# Patient Record
Sex: Female | Born: 1975 | Smoking: Current every day smoker
Health system: Southern US, Community
[De-identification: ages and names within clinical notes are randomized; demographics above are authoritative.]

## PROBLEM LIST (undated history)

## (undated) DIAGNOSIS — I10 Essential (primary) hypertension: Secondary | ICD-10-CM

## (undated) DIAGNOSIS — Q059 Spina bifida, unspecified: Secondary | ICD-10-CM

---

## 2012-11-09 ENCOUNTER — Emergency Department: Payer: Self-pay | Admitting: Emergency Medicine

## 2012-11-10 LAB — DRUG SCREEN, URINE
Amphetamines, Ur Screen: NEGATIVE (ref ?–1000)
Barbiturates, Ur Screen: NEGATIVE (ref ?–200)
Cocaine Metabolite,Ur ~~LOC~~: NEGATIVE (ref ?–300)
Opiate, Ur Screen: NEGATIVE (ref ?–300)
Phencyclidine (PCP) Ur S: NEGATIVE (ref ?–25)
Tricyclic, Ur Screen: NEGATIVE (ref ?–1000)

## 2012-11-10 LAB — COMPREHENSIVE METABOLIC PANEL
Anion Gap: 7 (ref 7–16)
Calcium, Total: 9.1 mg/dL (ref 8.5–10.1)
Chloride: 109 mmol/L — ABNORMAL HIGH (ref 98–107)
EGFR (Non-African Amer.): >60
Potassium: 3.8 mmol/L (ref 3.5–5.1)
SGOT(AST): 16 U/L (ref 15–37)
SGPT (ALT): 14 U/L (ref 12–78)
Sodium: 139 mmol/L (ref 136–145)
Total Protein: 7.6 g/dL (ref 6.4–8.2)

## 2012-11-10 LAB — ETHANOL
Ethanol %: <0.003 % (ref 0.000–0.080)
Ethanol: <3 mg/dL

## 2012-11-10 LAB — URINALYSIS, COMPLETE
Bacteria: NONE SEEN
Bilirubin,UR: NEGATIVE
Glucose,UR: NEGATIVE mg/dL (ref 0–75)
Hyaline Cast: 5
Ketone: NEGATIVE
Nitrite: NEGATIVE
RBC,UR: 4 /HPF (ref 0–5)
Specific Gravity: 1.024 (ref 1.003–1.030)

## 2012-11-10 LAB — CBC
HCT: 43.1 % (ref 35.0–47.0)
MCHC: 34.5 g/dL (ref 32.0–36.0)
MCV: 93 fL (ref 80–100)
Platelet: 269 10*3/uL (ref 150–440)
RBC: 4.66 10*6/uL (ref 3.80–5.20)

## 2012-11-10 LAB — TROPONIN I: Troponin-I: <0.02 ng/mL

## 2013-02-08 ENCOUNTER — Emergency Department: Payer: Self-pay

## 2013-02-08 LAB — COMPREHENSIVE METABOLIC PANEL
Alkaline Phosphatase: 95 U/L (ref 50–136)
Bilirubin,Total: 0.1 mg/dL — ABNORMAL LOW (ref 0.2–1.0)
Calcium, Total: 9.1 mg/dL (ref 8.5–10.1)
Chloride: 112 mmol/L — ABNORMAL HIGH (ref 98–107)
Co2: 20 mmol/L — ABNORMAL LOW (ref 21–32)
EGFR (African American): >60
EGFR (Non-African Amer.): >60
Glucose: 70 mg/dL (ref 65–99)
Osmolality: 272 (ref 275–301)
SGOT(AST): 19 U/L (ref 15–37)
Sodium: 138 mmol/L (ref 136–145)

## 2013-02-08 LAB — CBC
HCT: 46.1 % (ref 35.0–47.0)
MCH: 31.5 pg (ref 26.0–34.0)
MCV: 93 fL (ref 80–100)
RBC: 4.96 10*6/uL (ref 3.80–5.20)
RDW: 13.8 % (ref 11.5–14.5)

## 2013-02-08 LAB — TROPONIN I: Troponin-I: <0.02 ng/mL

## 2013-02-08 LAB — CK TOTAL AND CKMB (NOT AT ARMC)
CK, Total: 38 U/L (ref 21–215)
CK-MB: <0.5 ng/mL — ABNORMAL LOW (ref 0.5–3.6)

## 2013-02-11 ENCOUNTER — Emergency Department: Payer: Self-pay | Admitting: Emergency Medicine

## 2013-02-11 LAB — TROPONIN I
Troponin-I: <0.02 ng/mL
Troponin-I: <0.02 ng/mL

## 2013-02-11 LAB — COMPREHENSIVE METABOLIC PANEL
Albumin: 3.5 g/dL (ref 3.4–5.0)
Alkaline Phosphatase: 93 U/L (ref 50–136)
Anion Gap: 2 — ABNORMAL LOW (ref 7–16)
BUN: 14 mg/dL (ref 7–18)
Calcium, Total: 9.1 mg/dL (ref 8.5–10.1)
Co2: 26 mmol/L (ref 21–32)
Creatinine: 0.49 mg/dL — ABNORMAL LOW (ref 0.60–1.30)
EGFR (African American): >60
EGFR (Non-African Amer.): >60
Glucose: 90 mg/dL (ref 65–99)
Osmolality: 276 (ref 275–301)
Potassium: 5.1 mmol/L (ref 3.5–5.1)
SGOT(AST): 26 U/L (ref 15–37)
SGPT (ALT): 17 U/L (ref 12–78)

## 2013-02-11 LAB — CBC
HCT: 41.4 % (ref 35.0–47.0)
HGB: 14.1 g/dL (ref 12.0–16.0)
MCH: 31.7 pg (ref 26.0–34.0)
Platelet: 300 10*3/uL (ref 150–440)
WBC: 6.3 10*3/uL (ref 3.6–11.0)

## 2013-02-11 LAB — CK TOTAL AND CKMB (NOT AT ARMC)
CK, Total: 74 U/L (ref 21–215)
CK-MB: <0.5 ng/mL — ABNORMAL LOW (ref 0.5–3.6)

## 2017-10-21 ENCOUNTER — Encounter: Payer: Self-pay | Admitting: Emergency Medicine

## 2017-10-21 ENCOUNTER — Emergency Department
Admission: EM | Admit: 2017-10-21 | Discharge: 2017-10-21 | Disposition: A | Discharge status: Home / Self Care | Payer: Medicaid Other | Attending: Emergency Medicine | Admitting: Emergency Medicine

## 2017-10-21 ENCOUNTER — Emergency Department: Payer: Medicaid Other

## 2017-10-21 ENCOUNTER — Other Ambulatory Visit: Payer: Self-pay

## 2017-10-21 DIAGNOSIS — Y939 Activity, unspecified: Secondary | ICD-10-CM | POA: Insufficient documentation

## 2017-10-21 DIAGNOSIS — I10 Essential (primary) hypertension: Secondary | ICD-10-CM | POA: Insufficient documentation

## 2017-10-21 DIAGNOSIS — S93401A Sprain of unspecified ligament of right ankle, initial encounter: Secondary | ICD-10-CM | POA: Insufficient documentation

## 2017-10-21 DIAGNOSIS — Y929 Unspecified place or not applicable: Secondary | ICD-10-CM | POA: Diagnosis not present

## 2017-10-21 DIAGNOSIS — F17210 Nicotine dependence, cigarettes, uncomplicated: Secondary | ICD-10-CM | POA: Insufficient documentation

## 2017-10-21 DIAGNOSIS — Y999 Unspecified external cause status: Secondary | ICD-10-CM | POA: Diagnosis not present

## 2017-10-21 DIAGNOSIS — R609 Edema, unspecified: Secondary | ICD-10-CM

## 2017-10-21 DIAGNOSIS — X58XXXA Exposure to other specified factors, initial encounter: Secondary | ICD-10-CM | POA: Diagnosis not present

## 2017-10-21 DIAGNOSIS — S99911A Unspecified injury of right ankle, initial encounter: Secondary | ICD-10-CM | POA: Diagnosis present

## 2017-10-21 HISTORY — DX: Essential (primary) hypertension: I10

## 2017-10-21 HISTORY — DX: Spina bifida, unspecified: Q05.9

## 2017-10-21 MED ORDER — IBUPROFEN 600 MG PO TABS: 600.0000 mg | ORAL_TABLET | Freq: Three times a day (TID) | ORAL | 0 refills | Status: DC | PRN

## 2017-10-21 MED ORDER — IBUPROFEN 600 MG PO TABS
600.0000 mg | ORAL_TABLET | Freq: Once | ORAL | Status: AC
Start: 2017-10-21 — End: 2017-10-21
  Administered 2017-10-21: 600 mg via ORAL
  Filled 2017-10-21: qty 1

## 2017-10-21 NOTE — ED Provider Notes (Signed)
[facility] Emergency Department Provider Note ____________________________________________  Time seen: 1534  I have reviewed the triage vital signs and the nursing notes.  HISTORY  Chief Complaint  Foot Pain (unknown injury)  HPI Carolyn Herring is a 42 y.o. female presents to the ED for evaluation of right foot swelling. She reports onset upon awakening this morning. She denies any injury, slip, roll, contusion, or falling injury. She describes swelling to the dorsal and lateral aspect of the right foot. She denies distal paresthesias or skin changes. She denies a history of gout, DVT, or arthritis. She did not take any medicine for pain in the interim.   Past Medical History:  Diagnosis Date  . Hypertension   . Spina bifida (HCC)     There are no active problems to display for this patient.  History reviewed. No pertinent surgical history.  Prior to Admission medications   Medication Sig Start Date End Date Taking? Authorizing Provider  ibuprofen (ADVIL,MOTRIN) 600 MG tablet Take 1 tablet (600 mg total) by mouth every 8 (eight) hours as needed for moderate pain. 10/21/17   Rayce Brahmbhatt, Charlesetta IvoryJenise V Bacon, PA-C    Allergies Codeine; Hydrogen peroxide; and Latex  History reviewed. No pertinent family history.  Social History Social History   Tobacco Use  . Smoking status: Current Every Day Smoker    Packs/day: 0.50  . Smokeless tobacco: Never Used  Substance Use Topics  . Alcohol use: Never    Frequency: Never  . Drug use: Never    Review of Systems  Constitutional: Negative for fever. Cardiovascular: Negative for chest pain. Respiratory: Negative for shortness of breath. Musculoskeletal: Negative for back pain. Right foot pain as above Skin: Negative for rash. Neurological: Negative for headaches, focal weakness or numbness. ____________________________________________  PHYSICAL EXAM:  VITAL SIGNS: ED Triage Vitals  Enc Vitals Group   BP 10/21/17 1457 132/88     Pulse Rate 10/21/17 1457 78     Resp 10/21/17 1457 18     Temp 10/21/17 1457 98.4 F (36.9 C)     Temp Source 10/21/17 1457 Oral     SpO2 10/21/17 1457 100 %     Weight 10/21/17 1458 110 lb (49.9 kg)     Height 10/21/17 1458 4\' 8"  (1.422 m)     Head Circumference --      Peak Flow --      Pain Score 10/21/17 1457 7     Pain Loc --      Pain Edu? --      Excl. in GC? --     Constitutional: Alert and oriented. Well appearing and in no distress. Head: Normocephalic and atraumatic. Cardiovascular: Normal rate, regular rhythm. Normal distal pulses. Respiratory: Normal respiratory effort.  Musculoskeletal: Right foot with subtle soft tissue swelling to the lateral dorsal aspects.  Patient has similar Right foot with status post distal anterolateral dorsal aspect. Patient had similar lateral joint effusion swelling to the unaffected left ankle. Normal range of motion in all other extremities.  Neurologic:  Normal gait without ataxia. Normal speech and language. No gross focal neurologic deficits are appreciated. Skin:  Skin is warm, dry and intact. No rash noted. Psychiatric: Mood and affect are normal. Patient exhibits appropriate insight and judgment. ____________________________________________   RADIOLOGY  Right Foot  IMPRESSION: Soft tissue swelling without bony abnormality. ____________________________________________  PROCEDURES  Procedures IBU 600 mg PO Post-Op shoe ____________________________________________  INITIAL IMPRESSION / ASSESSMENT AND PLAN / ED COURSE  Patient with ED  evaluation of swelling and tenderness to the dorsal lateral right foot.  No injuries reported.  X-rays negative for any acute fracture but does indicate soft tissue swelling.  Symptoms do not seem to represent a acute gout flare.  Patient is otherwise stable on her exam.  She was placed in a postop shoe for comfort, and referred to podiatry for further evaluation and  management.  She will take over-the-counter ibuprofen as needed for pain relief. ____________________________________________  FINAL CLINICAL IMPRESSION(S) / ED DIAGNOSES  Final diagnoses:  Swelling  Sprain of right ankle, unspecified ligament, initial encounter      Lissa Hoard, PA-C 10/22/17 0015    Phineas Semen, MD 10/22/17 [id number]

## 2017-10-21 NOTE — ED Triage Notes (Signed)
Pt states does not remember injuring her rt foot but has swelling and pain today

## 2017-10-21 NOTE — Discharge Instructions (Addendum)
Your exam is consistent with a probable sprain. Take the ibuprofen as needed. Rest with the foot elevated. Wear the post-op shoe when walking. Follow-up with podiatry for ongoing symptoms and pain.

## 2017-10-21 NOTE — ED Notes (Signed)
Pt complains of right ankle/ foot pain, denies any recent trauma or injury. Pt able to plantar flex foot but states she is unable to tilt toes up or rotate foot side to side due to pain and tightness in the joint

## 2019-10-12 ENCOUNTER — Emergency Department: Payer: Medicaid Other

## 2019-10-12 ENCOUNTER — Encounter: Payer: Self-pay | Admitting: Emergency Medicine

## 2019-10-12 ENCOUNTER — Other Ambulatory Visit: Payer: Self-pay

## 2019-10-12 ENCOUNTER — Emergency Department
Admission: EM | Admit: 2019-10-12 | Discharge: 2019-10-12 | Disposition: A | Discharge status: Home / Self Care | Payer: Medicaid Other | Attending: Emergency Medicine | Admitting: Emergency Medicine

## 2019-10-12 DIAGNOSIS — F17210 Nicotine dependence, cigarettes, uncomplicated: Secondary | ICD-10-CM | POA: Insufficient documentation

## 2019-10-12 DIAGNOSIS — I10 Essential (primary) hypertension: Secondary | ICD-10-CM | POA: Diagnosis not present

## 2019-10-12 DIAGNOSIS — Z91040 Latex allergy status: Secondary | ICD-10-CM | POA: Diagnosis not present

## 2019-10-12 DIAGNOSIS — R220 Localized swelling, mass and lump, head: Secondary | ICD-10-CM | POA: Diagnosis not present

## 2019-10-12 LAB — BASIC METABOLIC PANEL
Anion gap: 8 (ref 5–15)
BUN: 5 mg/dL — ABNORMAL LOW (ref 6–20)
CO2: 22 mmol/L (ref 22–32)
Calcium: 8.6 mg/dL — ABNORMAL LOW (ref 8.9–10.3)
Chloride: 107 mmol/L (ref 98–111)
Creatinine, Ser: 0.74 mg/dL (ref 0.44–1.00)
GFR calc Af Amer: >60 mL/min (ref >60)
GFR calc non Af Amer: >60 mL/min (ref >60)
Glucose, Bld: 88 mg/dL (ref 70–99)
Potassium: 3.6 mmol/L (ref 3.5–5.1)
Sodium: 137 mmol/L (ref 135–145)

## 2019-10-12 LAB — CBC
HCT: 43.7 % (ref 36.0–46.0)
Hemoglobin: 14.5 g/dL (ref 12.0–15.0)
MCH: 30.9 pg (ref 26.0–34.0)
MCHC: 33.2 g/dL (ref 30.0–36.0)
MCV: 93 fL (ref 80.0–100.0)
Platelets: 328 10*3/uL (ref 150–400)
RBC: 4.7 MIL/uL (ref 3.87–5.11)
RDW: 14.6 % (ref 11.5–15.5)
WBC: 9.4 10*3/uL (ref 4.0–10.5)
nRBC: 0 % (ref 0.0–0.2)

## 2019-10-12 MED ORDER — DIPHENHYDRAMINE HCL 25 MG PO TABS: 25.0000 mg | ORAL_TABLET | Freq: Four times a day (QID) | ORAL | 0 refills | Status: DC | PRN

## 2019-10-12 NOTE — Discharge Instructions (Addendum)
Your lab tests and CT scan of the brain were okay today.

## 2019-10-12 NOTE — ED Triage Notes (Signed)
FIRST NURSE NOTE:  Pt reports right side facial numbness since last night.

## 2019-10-12 NOTE — ED Notes (Signed)
Pt presentation discussed with EDP, Stafford; see new orders. °

## 2019-10-12 NOTE — ED Notes (Addendum)
Pt states coming in as yesterday she started to have swelling to the right jaw. Pt states it feels like she was at the dentist and got a triple dose of the numbing medication. Pt states she has spinabifida and the sensation and movement of her extremities are normal, just numbness to the right side of the face. Pt states no known trauma.   Pt states she is now ready to go home.

## 2019-10-12 NOTE — ED Triage Notes (Signed)
Pt in via POV, reports new onset right side facial numnbess w/ tingling going down neck and into finger tips.  Some swelling noted to right jaw.  Onset of numbness approximately 2030 last night.  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    A/Ox4, otherwise neurologically intact, NAD noted at this time.

## 2019-10-12 NOTE — ED Provider Notes (Signed)
[facility] Emergency Department Provider Note  ____________________________________________  Time seen: Approximately 8:45 PM  I have reviewed the triage vital signs and the nursing notes.   HISTORY  Chief Complaint Facial Swelling and Numbness    HPI Carolyn Herring is a 44 y.o. female with a history of hypertension and spina bifida who comes the ED complaining of right facial swelling that started yesterday evening.  Waxing and waning, no aggravating relieving factors.  No known trauma or bites, no new exposures, denies tongue or throat swelling or dysphagia.  No lip swelling.  Never had anything like this before.  No recent viral illness.  No aggravating or alleviating factors.  No vision changes or lateralizing paresthesia or weakness.  No change in balance or coordination, no falls.      Past Medical History:  Diagnosis Date  . Hypertension   . Spina bifida (HCC)      There are no problems to display for this patient.    History reviewed. No pertinent surgical history.   Prior to Admission medications   Medication Sig Start Date End Date Taking? Authorizing Provider  diphenhydrAMINE (BENADRYL) 25 MG tablet Take 1 tablet (25 mg total) by mouth every 6 (six) hours as needed. 10/12/19   Sharman Cheek, MD  ibuprofen (ADVIL,MOTRIN) 600 MG tablet Take 1 tablet (600 mg total) by mouth every 8 (eight) hours as needed for moderate pain. 10/21/17   Menshew, Charlesetta Ivory, PA-C     Allergies Codeine, Hydrogen peroxide, and Latex   No family history on file.  Social History Social History   Tobacco Use  . Smoking status: Current Every Day Smoker    Packs/day: 0.50  . Smokeless tobacco: Never Used  Vaping Use  . Vaping Use: Never used  Substance Use Topics  . Alcohol use: Never  . Drug use: Never    Review of Systems  Constitutional:   No fever or chills.  ENT:   No sore throat. No rhinorrhea. Cardiovascular:   No chest pain or  syncope. Respiratory:   No dyspnea or cough. Gastrointestinal:   Negative for abdominal pain, vomiting and diarrhea.  Musculoskeletal:   Negative for focal pain or swelling All other systems reviewed and are negative except as documented above in ROS and HPI.  ____________________________________________   PHYSICAL EXAM:  VITAL SIGNS: ED Triage Vitals  Enc Vitals Group     BP 10/12/19 1620 (!) 159/110     Pulse Rate 10/12/19 1620 92     Resp 10/12/19 1918 18     Temp 10/12/19 1620 98.6 F (37 C)     Temp Source 10/12/19 1620 Oral     SpO2 10/12/19 1620 99 %     Weight 10/12/19 1609 120 lb (54.4 kg)     Height 10/12/19 1609 4\' 8"  (1.422 m)     Head Circumference --      Peak Flow --      Pain Score 10/12/19 1609 6     Pain Loc --      Pain Edu? --      Excl. in GC? --     Vital signs reviewed, nursing assessments reviewed.   Constitutional:   Alert and oriented. Non-toxic appearance. Eyes:   Conjunctivae are normal. EOMI. PERRL. ENT      Head:   Normocephalic and atraumatic.  There is mild soft edema over the right maxilla without bony point tenderness or ecchymosis.  No battle sign or raccoon eyes  Nose:   Normal, no rhinorrhea      Mouth/Throat: Moist mucous membranes, no tongue elevation or swelling, no palatal swelling.      Neck:   No meningismus. Full ROM. Hematological/Lymphatic/Immunilogical:   No cervical lymphadenopathy. Cardiovascular:   RRR. Symmetric bilateral radial and DP pulses.  No murmurs. Cap refill less than 2 seconds. Respiratory:   Normal respiratory effort without tachypnea/retractions. Breath sounds are clear and equal bilaterally. No wheezes/rales/rhonchi. Gastrointestinal:   Soft and nontender. Non distended. There is no CVA tenderness.  No rebound, rigidity, or guarding.  Musculoskeletal:   Normal range of motion in all extremities. No joint effusions.  No lower extremity tenderness.  No edema. Neurologic:   Normal speech and language.   Motor grossly intact. No acute focal neurologic deficits are appreciated.  Skin:    Skin is warm, dry and intact. No rash noted.  No petechiae, purpura, or bullae.  ____________________________________________    LABS (pertinent positives/negatives) (all labs ordered are listed, but only abnormal results are displayed) Labs Reviewed  BASIC METABOLIC PANEL - Abnormal; Notable for the following components:      Result Value   BUN 5 (*)    Calcium 8.6 (*)    All other components within normal limits  CBC   ____________________________________________   EKG    ____________________________________________    RADIOLOGY  CT Head Wo Contrast  Result Date: 10/12/2019 CLINICAL DATA:  Right-sided facial numbness. EXAM: CT HEAD WITHOUT CONTRAST TECHNIQUE: Contiguous axial images were obtained from the base of the skull through the vertex without intravenous contrast. COMPARISON:  February 08, 2013. FINDINGS: Brain: Distal tip of right frontal ventriculostomy catheter is seen in the midline between the frontal horns. No mass effect or midline shift is noted. Ventricular size is within normal limits. There is no evidence of mass lesion, hemorrhage or acute infarction. Vascular: No hyperdense vessel or unexpected calcification. Skull: Right frontal ventriculostomy is noted. 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 No acute abnormality is noted involving the skull. Sinuses/Orbits: No acute finding. Other: None. IMPRESSION: Stable right frontal ventriculostomy. Ventricular size is within normal limits. No acute intracranial abnormality seen. Electronically Signed   By: Lupita Raider M.D.   On: 10/12/2019 16:57    ____________________________________________   PROCEDURES Procedures  ____________________________________________    CLINICAL IMPRESSION / ASSESSMENT AND PLAN / ED COURSE  Medications ordered in the ED: Medications - No data to  display  Pertinent labs & imaging results that were available during my care of the patient were reviewed by me and considered in my medical decision making (see chart for details).  YEIRA GULDEN was evaluated in Emergency Department on 10/12/2019 for the symptoms described in the history of present illness. She was evaluated in the context of the global COVID-19 pandemic, which necessitated consideration that the patient might be at risk for infection with the SARS-CoV-2 virus that causes COVID-19. Institutional protocols and algorithms that pertain to the evaluation of patients at risk for COVID-19 are in a state of rapid change based on information released by regulatory bodies including the CDC and federal and state organizations. These policies and algorithms were followed during the patient's care in the ED.   Patient presents with mild swelling over the right maxilla as an isolated symptom.  No other concerning exam findings.  Vital signs are normal, breathing is unlabored, rest of the exam is reassuring.  No evidence of infection, doubt angioedema or parotitis or  hypersensitivity reaction.  I have the patient try Benadryl, monitor symptoms over the next 48 hours, return to ED if worsening or see her doctor if not resolved in 2 days      ____________________________________________   FINAL CLINICAL IMPRESSION(S) / ED DIAGNOSES    Final diagnoses:  Facial swelling     ED Discharge Orders         Ordered    diphenhydrAMINE (BENADRYL) 25 MG tablet  Every 6 hours PRN     Discontinue  Reprint     10/12/19 2044          Portions of this note were generated with dragon dictation software. Dictation errors may occur despite best attempts at proofreading.   Sharman Cheek, MD 10/12/19 2048

## 2019-12-30 ENCOUNTER — Emergency Department: Payer: Medicaid Other

## 2019-12-30 ENCOUNTER — Encounter: Payer: Self-pay | Admitting: Emergency Medicine

## 2019-12-30 ENCOUNTER — Other Ambulatory Visit: Payer: Self-pay

## 2019-12-30 ENCOUNTER — Emergency Department
Admission: EM | Admit: 2019-12-30 | Discharge: 2019-12-30 | Disposition: A | Discharge status: Home / Self Care | Payer: Medicaid Other | Attending: Emergency Medicine | Admitting: Emergency Medicine

## 2019-12-30 DIAGNOSIS — M7989 Other specified soft tissue disorders: Secondary | ICD-10-CM

## 2019-12-30 DIAGNOSIS — I110 Hypertensive heart disease with heart failure: Secondary | ICD-10-CM | POA: Insufficient documentation

## 2019-12-30 DIAGNOSIS — F17200 Nicotine dependence, unspecified, uncomplicated: Secondary | ICD-10-CM | POA: Insufficient documentation

## 2019-12-30 DIAGNOSIS — R2242 Localized swelling, mass and lump, left lower limb: Secondary | ICD-10-CM | POA: Diagnosis not present

## 2019-12-30 DIAGNOSIS — I509 Heart failure, unspecified: Secondary | ICD-10-CM | POA: Insufficient documentation

## 2019-12-30 NOTE — ED Triage Notes (Signed)
Pt to triage via w/c with no distress noted; pt st left LE swelling & pain for "weeks" with no known injury; denies any other c/o or injuries

## 2019-12-30 NOTE — ED Notes (Signed)
Pt alert and oriented X 4, stable for discharge. RR even and unlabored, color WNL. Discussed discharge instructions and follow-up as directed. Discharge medications discussed if provided. Pt had opportunity to ask questions if necessary and RN to provide patient/family eduction.  

## 2019-12-30 NOTE — ED Provider Notes (Signed)
[facility] Emergency Department Provider Note   ____________________________________________    I have reviewed the triage vital signs and the nursing notes.   HISTORY  Chief Complaint Leg Pain     HPI Carolyn Herring is a 44 y.o. Carolyn with a history as noted below who presents with complaints of left leg swelling.  Patient reports she had significant swelling of her left leg today, it has been swelling mildly over the last several weeks but seem much worse today.  She denies significant pain.  She is concerned because her aunt has a history of CHF.  No swelling in her right leg.  No shortness of breath.  No pleurisy.  No history of blood clots.  Past Medical History:  Diagnosis Date  . Hypertension   . Spina bifida (HCC)     There are no problems to display for this patient.   History reviewed. No pertinent surgical history.  Prior to Admission medications   Medication Sig Start Date End Date Taking? Authorizing Provider  diphenhydrAMINE (BENADRYL) 25 MG tablet Take 1 tablet (25 mg total) by mouth every 6 (six) hours as needed. 10/12/19   Sharman Cheek, MD  ibuprofen (ADVIL,MOTRIN) 600 MG tablet Take 1 tablet (600 mg total) by mouth every 8 (eight) hours as needed for moderate pain. 10/21/17   Menshew, Charlesetta Ivory, PA-C     Allergies Codeine, Hydrogen peroxide, and Latex  No family history on file.  Social History Social History   Tobacco Use  . Smoking status: Current Every Day Smoker    Packs/day: 0.50  . Smokeless tobacco: Never Used  Vaping Use  . Vaping Use: Never used  Substance Use Topics  . Alcohol use: Never  . Drug use: Never    Review of Systems  Constitutional: No fever/chills Eyes: No visual changes.  ENT: No sore throat. Cardiovascular: Denies chest pain. Respiratory: As above Gastrointestinal: No abdominal pain.  No nausea, no vomiting.   Genitourinary: Negative for dysuria. Musculoskeletal: As  above Skin: Negative for rash. Neurological: Negative for headaches or weakness   ____________________________________________   PHYSICAL EXAM:  VITAL SIGNS: ED Triage Vitals  Enc Vitals Group     BP 12/30/19 1928 (!) 188/100     Pulse Rate 12/30/19 1928 77     Resp 12/30/19 1928 18     Temp 12/30/19 1928 98.4 F (36.9 C)     Temp Source 12/30/19 1928 Oral     SpO2 12/30/19 1928 100 %     Weight 12/30/19 1928 53.5 kg (118 lb)     Height 12/30/19 1928 1.422 m (4\' 8" )     Head Circumference --      Peak Flow --      Pain Score 12/30/19 1929 8     Pain Loc --      Pain Edu? --      Excl. in GC? --     Constitutional: Alert and oriented.  Nose: No congestion/rhinnorhea. Mouth/Throat: Mucous membranes are moist.   Neck:  Painless ROM Cardiovascular: Normal rate, regular rhythm. Grossly normal heart sounds.  Good peripheral circulation. Respiratory: Normal respiratory effort.  No retractions. Lungs CTAB.   Musculoskeletal: Left lower extremity: Very minimal swelling, no pitting edema, no tenderness palpation, no erythema, warm and well perfused, normal DP pulse Neurologic:  Normal speech and language. No gross focal neurologic deficits are appreciated.  Skin:  Skin is warm, dry and intact. No rash noted. Psychiatric: Mood and affect  are normal. Speech and behavior are normal.  ____________________________________________   LABS (all labs ordered are listed, but only abnormal results are displayed)  Labs Reviewed - No data to display ____________________________________________  EKG  None ____________________________________________  RADIOLOGY  Ultrasound negative for DVT ____________________________________________   PROCEDURES  Procedure(s) performed: No  Procedures   Critical Care performed: No ____________________________________________   INITIAL IMPRESSION / ASSESSMENT AND PLAN / ED COURSE  Pertinent labs & imaging results that were available  during my care of the patient were reviewed by me and considered in my medical decision making (see chart for details).  Patient with left leg swelling as detailed above, differential includes DVT, lympha, nonspecific peripheral edema  Ultrasound negative for DVT which is quite reassuring, unlikely to be asymmetric edema related to CHF although she does have a history of high blood pressure, have asked her to follow-up with PCP for further evaluation of lower extremity edema.    ____________________________________________   FINAL CLINICAL IMPRESSION(S) / ED DIAGNOSES  Final diagnoses:  Leg swelling        Note:  This document was prepared using Dragon voice recognition software and may include unintentional dictation errors.   Jene Every, MD 12/30/19 2118

## 2019-12-30 NOTE — ED Triage Notes (Signed)
First Nurse Note:  C/O left leg pain x 1 day.  VS wnl.  BP:  179/116.

## 2021-01-04 ENCOUNTER — Emergency Department: Payer: Medicaid Other

## 2021-01-04 ENCOUNTER — Inpatient Hospital Stay: Payer: Medicaid Other

## 2021-01-04 ENCOUNTER — Inpatient Hospital Stay
Admission: EM | Admit: 2021-01-04 | Discharge: 2021-01-18 | DRG: 871 | Disposition: A | Discharge status: Skilled Nursing Facility | Payer: Medicaid Other | Attending: Internal Medicine | Admitting: Internal Medicine

## 2021-01-04 ENCOUNTER — Other Ambulatory Visit: Payer: Self-pay

## 2021-01-04 ENCOUNTER — Encounter: Payer: Self-pay | Admitting: Emergency Medicine

## 2021-01-04 DIAGNOSIS — L8990 Pressure ulcer of unspecified site, unspecified stage: Secondary | ICD-10-CM | POA: Insufficient documentation

## 2021-01-04 DIAGNOSIS — J9602 Acute respiratory failure with hypercapnia: Secondary | ICD-10-CM | POA: Diagnosis present

## 2021-01-04 DIAGNOSIS — G9349 Other encephalopathy: Secondary | ICD-10-CM | POA: Diagnosis present

## 2021-01-04 DIAGNOSIS — Z91040 Latex allergy status: Secondary | ICD-10-CM

## 2021-01-04 DIAGNOSIS — J441 Chronic obstructive pulmonary disease with (acute) exacerbation: Secondary | ICD-10-CM | POA: Diagnosis present

## 2021-01-04 DIAGNOSIS — Z20822 Contact with and (suspected) exposure to covid-19: Secondary | ICD-10-CM | POA: Diagnosis present

## 2021-01-04 DIAGNOSIS — G8929 Other chronic pain: Secondary | ICD-10-CM | POA: Diagnosis present

## 2021-01-04 DIAGNOSIS — E876 Hypokalemia: Secondary | ICD-10-CM | POA: Diagnosis present

## 2021-01-04 DIAGNOSIS — Z982 Presence of cerebrospinal fluid drainage device: Secondary | ICD-10-CM

## 2021-01-04 DIAGNOSIS — Z23 Encounter for immunization: Secondary | ICD-10-CM

## 2021-01-04 DIAGNOSIS — I469 Cardiac arrest, cause unspecified: Secondary | ICD-10-CM

## 2021-01-04 DIAGNOSIS — L89512 Pressure ulcer of right ankle, stage 2: Secondary | ICD-10-CM | POA: Diagnosis not present

## 2021-01-04 DIAGNOSIS — D62 Acute posthemorrhagic anemia: Secondary | ICD-10-CM | POA: Diagnosis present

## 2021-01-04 DIAGNOSIS — H50111 Monocular exotropia, right eye: Secondary | ICD-10-CM | POA: Diagnosis present

## 2021-01-04 DIAGNOSIS — W458XXS Other foreign body or object entering through skin, sequela: Secondary | ICD-10-CM

## 2021-01-04 DIAGNOSIS — Z716 Tobacco abuse counseling: Secondary | ICD-10-CM

## 2021-01-04 DIAGNOSIS — G8250 Quadriplegia, unspecified: Secondary | ICD-10-CM | POA: Diagnosis present

## 2021-01-04 DIAGNOSIS — Q050 Cervical spina bifida with hydrocephalus: Secondary | ICD-10-CM

## 2021-01-04 DIAGNOSIS — T8501XA Breakdown (mechanical) of ventricular intracranial (communicating) shunt, initial encounter: Secondary | ICD-10-CM | POA: Diagnosis not present

## 2021-01-04 DIAGNOSIS — D500 Iron deficiency anemia secondary to blood loss (chronic): Secondary | ICD-10-CM | POA: Diagnosis not present

## 2021-01-04 DIAGNOSIS — I468 Cardiac arrest due to other underlying condition: Secondary | ICD-10-CM | POA: Diagnosis not present

## 2021-01-04 DIAGNOSIS — K521 Toxic gastroenteritis and colitis: Secondary | ICD-10-CM | POA: Diagnosis present

## 2021-01-04 DIAGNOSIS — E872 Acidosis, unspecified: Secondary | ICD-10-CM | POA: Diagnosis present

## 2021-01-04 DIAGNOSIS — I340 Nonrheumatic mitral (valve) insufficiency: Secondary | ICD-10-CM | POA: Diagnosis present

## 2021-01-04 DIAGNOSIS — B377 Candidal sepsis: Secondary | ICD-10-CM | POA: Diagnosis present

## 2021-01-04 DIAGNOSIS — B379 Candidiasis, unspecified: Secondary | ICD-10-CM | POA: Diagnosis not present

## 2021-01-04 DIAGNOSIS — J690 Pneumonitis due to inhalation of food and vomit: Secondary | ICD-10-CM | POA: Diagnosis present

## 2021-01-04 DIAGNOSIS — Z515 Encounter for palliative care: Secondary | ICD-10-CM

## 2021-01-04 DIAGNOSIS — B49 Unspecified mycosis: Secondary | ICD-10-CM | POA: Diagnosis not present

## 2021-01-04 DIAGNOSIS — R6520 Severe sepsis without septic shock: Secondary | ICD-10-CM | POA: Diagnosis not present

## 2021-01-04 DIAGNOSIS — W1830XA Fall on same level, unspecified, initial encounter: Secondary | ICD-10-CM | POA: Diagnosis present

## 2021-01-04 DIAGNOSIS — R296 Repeated falls: Secondary | ICD-10-CM | POA: Diagnosis present

## 2021-01-04 DIAGNOSIS — J9601 Acute respiratory failure with hypoxia: Secondary | ICD-10-CM | POA: Diagnosis present

## 2021-01-04 DIAGNOSIS — I059 Rheumatic mitral valve disease, unspecified: Secondary | ICD-10-CM | POA: Diagnosis not present

## 2021-01-04 DIAGNOSIS — Z789 Other specified health status: Secondary | ICD-10-CM | POA: Diagnosis not present

## 2021-01-04 DIAGNOSIS — K922 Gastrointestinal hemorrhage, unspecified: Secondary | ICD-10-CM | POA: Diagnosis not present

## 2021-01-04 DIAGNOSIS — W19XXXA Unspecified fall, initial encounter: Secondary | ICD-10-CM

## 2021-01-04 DIAGNOSIS — K2100 Gastro-esophageal reflux disease with esophagitis, without bleeding: Secondary | ICD-10-CM | POA: Diagnosis present

## 2021-01-04 DIAGNOSIS — K254 Chronic or unspecified gastric ulcer with hemorrhage: Secondary | ICD-10-CM | POA: Diagnosis present

## 2021-01-04 DIAGNOSIS — A419 Sepsis, unspecified organism: Secondary | ICD-10-CM

## 2021-01-04 DIAGNOSIS — Z885 Allergy status to narcotic agent status: Secondary | ICD-10-CM

## 2021-01-04 DIAGNOSIS — R001 Bradycardia, unspecified: Secondary | ICD-10-CM | POA: Diagnosis present

## 2021-01-04 DIAGNOSIS — I6389 Other cerebral infarction: Secondary | ICD-10-CM | POA: Diagnosis not present

## 2021-01-04 DIAGNOSIS — T39395A Adverse effect of other nonsteroidal anti-inflammatory drugs [NSAID], initial encounter: Secondary | ICD-10-CM | POA: Diagnosis present

## 2021-01-04 DIAGNOSIS — Z8616 Personal history of COVID-19: Secondary | ICD-10-CM | POA: Diagnosis not present

## 2021-01-04 DIAGNOSIS — I63511 Cerebral infarction due to unspecified occlusion or stenosis of right middle cerebral artery: Secondary | ICD-10-CM | POA: Diagnosis present

## 2021-01-04 DIAGNOSIS — Z978 Presence of other specified devices: Secondary | ICD-10-CM

## 2021-01-04 DIAGNOSIS — I739 Peripheral vascular disease, unspecified: Secondary | ICD-10-CM | POA: Diagnosis present

## 2021-01-04 DIAGNOSIS — R4182 Altered mental status, unspecified: Secondary | ICD-10-CM | POA: Diagnosis not present

## 2021-01-04 DIAGNOSIS — Z79899 Other long term (current) drug therapy: Secondary | ICD-10-CM

## 2021-01-04 DIAGNOSIS — T859XXA Unspecified complication of internal prosthetic device, implant and graft, initial encounter: Secondary | ICD-10-CM

## 2021-01-04 DIAGNOSIS — T859XXS Unspecified complication of internal prosthetic device, implant and graft, sequela: Secondary | ICD-10-CM | POA: Diagnosis not present

## 2021-01-04 DIAGNOSIS — R578 Other shock: Secondary | ICD-10-CM | POA: Diagnosis present

## 2021-01-04 DIAGNOSIS — F17210 Nicotine dependence, cigarettes, uncomplicated: Secondary | ICD-10-CM | POA: Diagnosis present

## 2021-01-04 DIAGNOSIS — Z9911 Dependence on respirator [ventilator] status: Secondary | ICD-10-CM

## 2021-01-04 DIAGNOSIS — I10 Essential (primary) hypertension: Secondary | ICD-10-CM | POA: Diagnosis present

## 2021-01-04 DIAGNOSIS — J9600 Acute respiratory failure, unspecified whether with hypoxia or hypercapnia: Secondary | ICD-10-CM

## 2021-01-04 DIAGNOSIS — Z791 Long term (current) use of non-steroidal anti-inflammatories (NSAID): Secondary | ICD-10-CM

## 2021-01-04 DIAGNOSIS — Z87728 Personal history of other specified (corrected) congenital malformations of nervous system and sense organs: Secondary | ICD-10-CM

## 2021-01-04 DIAGNOSIS — Z888 Allergy status to other drugs, medicaments and biological substances status: Secondary | ICD-10-CM

## 2021-01-04 DIAGNOSIS — Z87820 Personal history of traumatic brain injury: Secondary | ICD-10-CM

## 2021-01-04 DIAGNOSIS — I38 Endocarditis, valve unspecified: Secondary | ICD-10-CM | POA: Diagnosis not present

## 2021-01-04 DIAGNOSIS — I361 Nonrheumatic tricuspid (valve) insufficiency: Secondary | ICD-10-CM | POA: Diagnosis not present

## 2021-01-04 DIAGNOSIS — I371 Nonrheumatic pulmonary valve insufficiency: Secondary | ICD-10-CM | POA: Diagnosis not present

## 2021-01-04 DIAGNOSIS — T3695XA Adverse effect of unspecified systemic antibiotic, initial encounter: Secondary | ICD-10-CM | POA: Diagnosis present

## 2021-01-04 DIAGNOSIS — I959 Hypotension, unspecified: Secondary | ICD-10-CM | POA: Diagnosis present

## 2021-01-04 LAB — DIC (DISSEMINATED INTRAVASCULAR COAGULATION)PANEL
D-Dimer, Quant: 0.57 ug/mL-FEU — ABNORMAL HIGH (ref 0.00–0.50)
D-Dimer, Quant: 0.61 ug/mL-FEU — ABNORMAL HIGH (ref 0.00–0.50)
Fibrinogen: 362 mg/dL (ref 210–475)
Fibrinogen: 364 mg/dL (ref 210–475)
INR: 1.2 (ref 0.8–1.2)
INR: 1.1 (ref 0.8–1.2)
Platelets: 372 10*3/uL (ref 150–400)
Platelets: 406 10*3/uL — ABNORMAL HIGH (ref 150–400)
Prothrombin Time: 15.2 seconds (ref 11.4–15.2)
Prothrombin Time: 14.6 seconds (ref 11.4–15.2)
aPTT: 30 seconds (ref 24–36)
aPTT: 27 seconds (ref 24–36)

## 2021-01-04 LAB — LACTIC ACID, PLASMA
Lactic Acid, Venous: 3.6 mmol/L (ref 0.5–1.9)
Lactic Acid, Venous: 2.5 mmol/L (ref 0.5–1.9)

## 2021-01-04 LAB — CBC WITH DIFFERENTIAL/PLATELET
Abs Immature Granulocytes: 0.49 10*3/uL — ABNORMAL HIGH (ref 0.00–0.07)
Basophils Absolute: 0 10*3/uL (ref 0.0–0.1)
Basophils Relative: 0 %
Eosinophils Absolute: 0 10*3/uL (ref 0.0–0.5)
Eosinophils Relative: 0 %
HCT: 10.4 % — CL (ref 36.0–46.0)
Hemoglobin: 2.7 g/dL — CL (ref 12.0–15.0)
Immature Granulocytes: 2 %
Lymphocytes Relative: 4 %
Lymphs Abs: 1.3 10*3/uL (ref 0.7–4.0)
MCH: 16.9 pg — ABNORMAL LOW (ref 26.0–34.0)
MCHC: 26 g/dL — ABNORMAL LOW (ref 30.0–36.0)
MCV: 65 fL — ABNORMAL LOW (ref 80.0–100.0)
Monocytes Absolute: 1.3 10*3/uL — ABNORMAL HIGH (ref 0.1–1.0)
Monocytes Relative: 4 %
Neutro Abs: 27 10*3/uL — ABNORMAL HIGH (ref 1.7–7.7)
Neutrophils Relative %: 90 %
Platelets: 721 10*3/uL — ABNORMAL HIGH (ref 150–400)
RBC: 1.6 MIL/uL — ABNORMAL LOW (ref 3.87–5.11)
RDW: 29.9 % — ABNORMAL HIGH (ref 11.5–15.5)
Smear Review: INCREASED
WBC: 30.2 10*3/uL — ABNORMAL HIGH (ref 4.0–10.5)
nRBC: 2 % — ABNORMAL HIGH (ref 0.0–0.2)

## 2021-01-04 LAB — PROTIME-INR
INR: 1.2 (ref 0.8–1.2)
Prothrombin Time: 15.6 seconds — ABNORMAL HIGH (ref 11.4–15.2)

## 2021-01-04 LAB — COMPREHENSIVE METABOLIC PANEL
ALT: 13 U/L (ref 0–44)
AST: 20 U/L (ref 15–41)
Albumin: 2.1 g/dL — ABNORMAL LOW (ref 3.5–5.0)
Alkaline Phosphatase: 82 U/L (ref 38–126)
Anion gap: 9 (ref 5–15)
BUN: 36 mg/dL — ABNORMAL HIGH (ref 6–20)
CO2: 23 mmol/L (ref 22–32)
Calcium: 8 mg/dL — ABNORMAL LOW (ref 8.9–10.3)
Chloride: 103 mmol/L (ref 98–111)
Creatinine, Ser: 0.86 mg/dL (ref 0.44–1.00)
GFR, Estimated: >60 mL/min (ref >60)
Glucose, Bld: 144 mg/dL — ABNORMAL HIGH (ref 70–99)
Potassium: 3.4 mmol/L — ABNORMAL LOW (ref 3.5–5.1)
Sodium: 135 mmol/L (ref 135–145)
Total Bilirubin: 0.6 mg/dL (ref 0.3–1.2)
Total Protein: 5.5 g/dL — ABNORMAL LOW (ref 6.5–8.1)

## 2021-01-04 LAB — URINALYSIS, COMPLETE (UACMP) WITH MICROSCOPIC
Bilirubin Urine: NEGATIVE
Glucose, UA: NEGATIVE mg/dL
Ketones, ur: NEGATIVE mg/dL
Nitrite: NEGATIVE
Protein, ur: 30 mg/dL — AB
Specific Gravity, Urine: >1.046 — ABNORMAL HIGH (ref 1.005–1.030)
pH: 5 (ref 5.0–8.0)

## 2021-01-04 LAB — CK: Total CK: 85 U/L (ref 38–234)

## 2021-01-04 LAB — RESP PANEL BY RT-PCR (FLU A&B, COVID) ARPGX2
Influenza A by PCR: NEGATIVE
Influenza B by PCR: NEGATIVE
SARS Coronavirus 2 by RT PCR: NEGATIVE

## 2021-01-04 LAB — URINE DRUG SCREEN, QUALITATIVE (ARMC ONLY)
Amphetamines, Ur Screen: NOT DETECTED
Barbiturates, Ur Screen: NOT DETECTED
Benzodiazepine, Ur Scrn: NOT DETECTED
Cannabinoid 50 Ng, Ur ~~LOC~~: NOT DETECTED
Cocaine Metabolite,Ur ~~LOC~~: NOT DETECTED
MDMA (Ecstasy)Ur Screen: NOT DETECTED
Methadone Scn, Ur: NOT DETECTED
Opiate, Ur Screen: NOT DETECTED
Phencyclidine (PCP) Ur S: NOT DETECTED
Tricyclic, Ur Screen: NOT DETECTED

## 2021-01-04 LAB — FERRITIN: Ferritin: 21 ng/mL (ref 11–307)

## 2021-01-04 LAB — RETICULOCYTES
Immature Retic Fract: 11.2 % (ref 2.3–15.9)
RBC.: 4.05 MIL/uL (ref 3.87–5.11)
Retic Count, Absolute: 73.2 10*3/uL (ref 19.0–186.0)
Retic Ct Pct: 1.8 % (ref 0.4–3.1)

## 2021-01-04 LAB — IRON AND TIBC
Iron: 53 ug/dL (ref 28–170)
Saturation Ratios: 21 % (ref 10.4–31.8)
TIBC: 255 ug/dL (ref 250–450)
UIBC: 202 ug/dL

## 2021-01-04 LAB — HIV ANTIBODY (ROUTINE TESTING W REFLEX): HIV Screen 4th Generation wRfx: NONREACTIVE

## 2021-01-04 LAB — HEMOGLOBIN AND HEMATOCRIT, BLOOD
HCT: 32.1 % — ABNORMAL LOW (ref 36.0–46.0)
HCT: 33.2 % — ABNORMAL LOW (ref 36.0–46.0)
Hemoglobin: 11.1 g/dL — ABNORMAL LOW (ref 12.0–15.0)
Hemoglobin: 11.6 g/dL — ABNORMAL LOW (ref 12.0–15.0)

## 2021-01-04 LAB — ABO/RH: ABO/RH(D): O POS

## 2021-01-04 LAB — MRSA NEXT GEN BY PCR, NASAL: MRSA by PCR Next Gen: NOT DETECTED

## 2021-01-04 LAB — HCG, QUANTITATIVE, PREGNANCY: hCG, Beta Chain, Quant, S: 2 m[IU]/mL (ref <5)

## 2021-01-04 LAB — TROPONIN I (HIGH SENSITIVITY)
Troponin I (High Sensitivity): 12 ng/L (ref <18)
Troponin I (High Sensitivity): 30 ng/L — ABNORMAL HIGH (ref <18)

## 2021-01-04 LAB — PROCALCITONIN: Procalcitonin: 24.93 ng/mL

## 2021-01-04 LAB — GLUCOSE, CAPILLARY: Glucose-Capillary: 100 mg/dL — ABNORMAL HIGH (ref 70–99)

## 2021-01-04 LAB — LIPASE, BLOOD: Lipase: 50 U/L (ref 11–51)

## 2021-01-04 LAB — FOLATE: Folate: 21.5 ng/mL (ref >5.9)

## 2021-01-04 LAB — LACTATE DEHYDROGENASE: LDH: 146 U/L (ref 98–192)

## 2021-01-04 LAB — VITAMIN B12: Vitamin B-12: 364 pg/mL (ref 180–914)

## 2021-01-04 LAB — APTT: aPTT: 31 seconds (ref 24–36)

## 2021-01-04 MED ORDER — PANTOPRAZOLE 80MG IVPB - SIMPLE MED
80.0000 mg | Freq: Once | INTRAVENOUS | Status: AC
  Administered 2021-01-04: 80 mg via INTRAVENOUS
  Filled 2021-01-04: qty 80

## 2021-01-04 MED ORDER — PANTOPRAZOLE INFUSION (NEW) - SIMPLE MED
8.0000 mg/h | INTRAVENOUS | Status: AC
  Administered 2021-01-04 – 2021-01-07 (×7): 8 mg/h via INTRAVENOUS
  Filled 2021-01-04 (×5): qty 100
  Filled 2021-01-04: qty 80

## 2021-01-04 MED ORDER — STROKE: EARLY STAGES OF RECOVERY BOOK: Freq: Once | Status: AC

## 2021-01-04 MED ORDER — IOHEXOL 350 MG/ML SOLN
75.0000 mL | Freq: Once | INTRAVENOUS | Status: AC | PRN
  Administered 2021-01-04: 75 mL via INTRAVENOUS
  Filled 2021-01-04: qty 75

## 2021-01-04 MED ORDER — SODIUM CHLORIDE 0.9 % IV SOLN
10.0000 mL/h | Freq: Once | INTRAVENOUS | Status: AC
  Administered 2021-01-04: 10 mL/h via INTRAVENOUS

## 2021-01-04 MED ORDER — IPRATROPIUM-ALBUTEROL 0.5-2.5 (3) MG/3ML IN SOLN: 3.0000 mL | Freq: Four times a day (QID) | RESPIRATORY_TRACT | Status: DC | PRN

## 2021-01-04 MED ORDER — LACTATED RINGERS IV BOLUS
1000.0000 mL | Freq: Once | INTRAVENOUS | Status: AC
  Administered 2021-01-04: 1000 mL via INTRAVENOUS

## 2021-01-04 MED ORDER — CHLORHEXIDINE GLUCONATE CLOTH 2 % EX PADS
6.0000 | MEDICATED_PAD | Freq: Every day | CUTANEOUS | Status: DC
  Administered 2021-01-04 – 2021-01-08 (×3): 6 via TOPICAL

## 2021-01-04 MED ORDER — POTASSIUM CHLORIDE CRYS ER 20 MEQ PO TBCR
20.0000 meq | EXTENDED_RELEASE_TABLET | Freq: Once | ORAL | Status: AC
  Administered 2021-01-04: 20 meq via ORAL
  Filled 2021-01-04: qty 1

## 2021-01-04 MED ORDER — SODIUM CHLORIDE 0.9 % IV SOLN
3.0000 g | Freq: Four times a day (QID) | INTRAVENOUS | Status: DC
  Administered 2021-01-04 – 2021-01-07 (×11): 3 g via INTRAVENOUS
  Filled 2021-01-04 (×2): qty 8
  Filled 2021-01-04 (×2): qty 3
  Filled 2021-01-04: qty 8
  Filled 2021-01-04: qty 3
  Filled 2021-01-04 (×3): qty 8
  Filled 2021-01-04 (×2): qty 3
  Filled 2021-01-04: qty 8
  Filled 2021-01-04 (×2): qty 3
  Filled 2021-01-04: qty 8

## 2021-01-04 MED ORDER — NOREPINEPHRINE 4 MG/250ML-% IV SOLN: 0.0000 ug/min | INTRAVENOUS | Status: DC

## 2021-01-04 MED ORDER — PIPERACILLIN-TAZOBACTAM 3.375 G IVPB 30 MIN
3.3750 g | Freq: Once | INTRAVENOUS | Status: AC
  Administered 2021-01-04: 3.375 g via INTRAVENOUS
  Filled 2021-01-04: qty 50

## 2021-01-04 MED ORDER — BUDESONIDE 0.5 MG/2ML IN SUSP
0.5000 mg | Freq: Two times a day (BID) | RESPIRATORY_TRACT | Status: DC
  Administered 2021-01-04 – 2021-01-18 (×23): 0.5 mg via RESPIRATORY_TRACT
  Filled 2021-01-04 (×27): qty 2

## 2021-01-04 MED ORDER — IPRATROPIUM-ALBUTEROL 0.5-2.5 (3) MG/3ML IN SOLN
3.0000 mL | RESPIRATORY_TRACT | Status: DC
  Administered 2021-01-04 – 2021-01-05 (×3): 3 mL via RESPIRATORY_TRACT
  Filled 2021-01-04 (×3): qty 3

## 2021-01-04 MED ORDER — METHYLPREDNISOLONE SODIUM SUCC 40 MG IJ SOLR
20.0000 mg | Freq: Two times a day (BID) | INTRAMUSCULAR | Status: DC
  Administered 2021-01-04 – 2021-01-08 (×8): 20 mg via INTRAVENOUS
  Filled 2021-01-04 (×9): qty 1

## 2021-01-04 MED ORDER — ONDANSETRON HCL 4 MG/2ML IJ SOLN: 4.0000 mg | Freq: Four times a day (QID) | INTRAMUSCULAR | Status: DC | PRN

## 2021-01-04 MED ORDER — PANTOPRAZOLE SODIUM 40 MG IV SOLR
40.0000 mg | Freq: Two times a day (BID) | INTRAVENOUS | Status: DC
  Administered 2021-01-08 – 2021-01-09 (×4): 40 mg via INTRAVENOUS
  Filled 2021-01-04 (×4): qty 40

## 2021-01-04 MED ORDER — DIPHENHYDRAMINE HCL 50 MG/ML IJ SOLN
25.0000 mg | Freq: Once | INTRAMUSCULAR | Status: AC
  Administered 2021-01-04: 25 mg via INTRAVENOUS
  Filled 2021-01-04: qty 1

## 2021-01-04 NOTE — ED Provider Notes (Signed)
[facility] Emergency Department Provider Note  ____________________________________________   Event Date/Time   First MD Initiated Contact with Patient 01/04/21 1136     (approximate)  I have reviewed the triage vital signs and the nursing notes.   HISTORY  Chief Complaint Fall   HPI Carolyn Herring is a 45 y.o. female with past medical history of hypertension and hydrocephalus and spina bifida status post VP shunt with cervical tethered cord release in 2011 last revised in 2019 having not seen a neurosurgeon in over 3 years who presents via EMS from home for assessment of recurrent falls including a fall today as well as generalized weakness and some back pain.  Patient states she is living alone and has been feeling very weak since she was diagnosed with COVID months ago.  She endorses some chronic generalized abdominal pain as well is ongoing over the last at least several weeks.  She states that over the last couple days whenever she stands up she loses her vision and passes out and falls.  She is not sure if she has been hitting her head almost think she might.  She has not seen her regular doctor for this.  She states that her aunt lives next to her but that she was able to get up and call ambulance herself today but not able to answer the door and EMS had to break down the door to get to her today.  She denies any vomiting, diarrhea, burning with urination, fevers, cough, shortness of breath or other clear acute associated symptoms.  She is somewhat poor historian and is not oriented to year to further history is limited from the patient on arrival secondary to altered mental status.         Past Medical History:  Diagnosis Date   Hypertension    Spina bifida (HCC)     There are no problems to display for this patient.   History reviewed. No pertinent surgical history.  Prior to Admission medications   Medication Sig Start Date End Date Taking?  Authorizing Provider  diphenhydrAMINE (BENADRYL) 25 MG tablet Take 1 tablet (25 mg total) by mouth every 6 (six) hours as needed. 10/12/19   Sharman Cheek, MD  ibuprofen (ADVIL,MOTRIN) 600 MG tablet Take 1 tablet (600 mg total) by mouth every 8 (eight) hours as needed for moderate pain. 10/21/17   Menshew, Charlesetta Ivory, PA-C    Allergies Codeine, Hydrogen peroxide, and Latex  No family history on file.  Social History Social History   Tobacco Use   Smoking status: Every Day    Packs/day: 0.50    Types: Cigarettes   Smokeless tobacco: Never  Vaping Use   Vaping Use: Never used  Substance Use Topics   Alcohol use: Never   Drug use: Never    Review of Systems  Review of Systems  Unable to perform ROS: Mental status change     ____________________________________________   PHYSICAL EXAM:  VITAL SIGNS: ED Triage Vitals  Enc Vitals Group     BP      Pulse      Resp      Temp      Temp src      SpO2      Weight      Height      Head Circumference      Peak Flow      Pain Score      Pain Loc  Pain Edu?      Excl. in GC?    Vitals:   01/04/21 1218 01/04/21 1242  BP:  (!) 79/52  Pulse: 68   Resp:    Temp:    SpO2: (!) 79%    Physical Exam Vitals and nursing note reviewed.  Constitutional:      General: She is in acute distress.     Appearance: She is well-developed. She is ill-appearing.  HENT:     Head: Normocephalic.     Right Ear: External ear normal.     Left Ear: External ear normal.     Mouth/Throat:     Mouth: Mucous membranes are dry.  Eyes:     Conjunctiva/sclera: Conjunctivae normal.  Cardiovascular:     Rate and Rhythm: Normal rate and regular rhythm.     Heart sounds: No murmur heard. Pulmonary:     Effort: Pulmonary effort is normal. No respiratory distress.     Breath sounds: Normal breath sounds.  Abdominal:     Palpations: Abdomen is soft.     Tenderness: There is abdominal tenderness.  Musculoskeletal:     Cervical  back: Neck supple.     Right lower leg: No edema.     Left lower leg: No edema.  Skin:    General: Skin is warm and dry.     Capillary Refill: Capillary refill takes more than 3 seconds.     Coloration: Skin is pale.  Neurological:     Mental Status: She is alert. She is disoriented.    Mild over the C and L-spine without tenderness over the T-spine.  2+ radial and DP pulses.  She is able to move all extremities with symmetric strength.  VP shunt palpable in the skin of the right parietal scalp.  No other obvious trauma to the Head or neck.  Pupils are unremarkable.  Patient does not seem to have an abdominal injury or blood, disconjugate gaze.  She states her vision is "just not quite right".  No significant trauma or tenderness over the chest but there is some mild tenderness throughout the abdomen.  Patient is Hemoccult positive brown stool on rectal exam.  She has intact rectal tone and a hard stool noted in the vault without active bleeding. ____________________________________________   LABS (all labs ordered are listed, but only abnormal results are displayed)  Labs Reviewed  CBC WITH DIFFERENTIAL/PLATELET - Abnormal; Notable for the following components:      Result Value   WBC 30.2 (*)    RBC 1.60 (*)    Hemoglobin 2.7 (*)    HCT 10.4 (*)    MCV 65.0 (*)    MCH 16.9 (*)    MCHC 26.0 (*)    RDW 29.9 (*)    Platelets 721 (*)    nRBC 2.0 (*)    Neutro Abs 27.0 (*)    Monocytes Absolute 1.3 (*)    Abs Immature Granulocytes 0.49 (*)    All other components within normal limits  LACTIC ACID, PLASMA - Abnormal; Notable for the following components:   Lactic Acid, Venous 3.6 (*)    All other components within normal limits  COMPREHENSIVE METABOLIC PANEL - Abnormal; Notable for the following components:   Potassium 3.4 (*)    Glucose, Bld 144 (*)    BUN 36 (*)    Calcium 8.0 (*)    Total Protein 5.5 (*)    Albumin 2.1 (*)    All other components within normal limits  PROTIME-INR - Abnormal; Notable for the following components:   Prothrombin Time 15.6 (*)    All other components within normal limits  RESP PANEL BY RT-PCR (FLU A&B, COVID) ARPGX2  CULTURE, BLOOD (SINGLE)  LIPASE, BLOOD  HCG, QUANTITATIVE, PREGNANCY  CK  APTT  LACTIC ACID, PLASMA  URINALYSIS, COMPLETE (UACMP) WITH MICROSCOPIC  PROCALCITONIN  DIC (DISSEMINATED INTRAVASCULAR COAGULATION)PANEL  DIC (DISSEMINATED INTRAVASCULAR COAGULATION)PANEL  DIC (DISSEMINATED INTRAVASCULAR COAGULATION)PANEL  DIC (DISSEMINATED INTRAVASCULAR COAGULATION)PANEL  DIC (DISSEMINATED INTRAVASCULAR COAGULATION)PANEL  HEMOGLOBIN AND HEMATOCRIT, BLOOD  HEMOGLOBIN AND HEMATOCRIT, BLOOD  HEMOGLOBIN AND HEMATOCRIT, BLOOD  HEMOGLOBIN AND HEMATOCRIT, BLOOD  HEMOGLOBIN AND HEMATOCRIT, BLOOD  RETICULOCYTES  PATHOLOGIST SMEAR REVIEW  TYPE AND SCREEN  MASSIVE TRANSFUSION PROTOCOL ORDER (BLOOD BANK NOTIFICATION)  ABO/RH  PREPARE FRESH FROZEN PLASMA  PREPARE RBC (CROSSMATCH)  TROPONIN I (HIGH SENSITIVITY)  TROPONIN I (HIGH SENSITIVITY)   ____________________________________________  EKG  Sinus tachycardia with a ventricular rate of 113, normal axis and nonspecific ST changes in inferior leads in V3 and V4 without other clear evidence of acute ischemia.  Significant artifact in these leads as well. ____________________________________________  RADIOLOGY  ED MD interpretation: Chest x-ray without focal consolidation, effusion, pneumothorax, rib fracture or other clear acute intrathoracic process.  Official radiology report(s): CT T-SPINE NO CHARGE  Result Date: 01/04/2021 CLINICAL DATA:  Fall EXAM: CT THORACIC SPINE WITHOUT CONTRAST TECHNIQUE: Multidetector CT images of the thoracic were obtained using the standard protocol without intravenous contrast. COMPARISON:  None. FINDINGS: Alignment: Normal. Vertebrae: No acute fracture or focal pathologic process. Paraspinal and other soft tissues: Reported  separately on chest CT. Disc levels: Preserved disc heights. Chronic enlargement of the spinal canal. IMPRESSION: No acute thoracic spine fracture. Electronically Signed   By: Caprice Renshaw M.D.   On: 01/04/2021 14:18   CT L-SPINE NO CHARGE  Result Date: 01/04/2021 CLINICAL DATA:  Fall EXAM: CT LUMBAR SPINE WITHOUT CONTRAST TECHNIQUE: Multidetector CT imaging of the lumbar spine was performed without intravenous contrast administration. Multiplanar CT image reconstructions were also generated. COMPARISON:  None. FINDINGS: Segmentation: 5 lumbar type vertebrae. Alignment: Normal Vertebrae: No acute fracture or focal pathologic process. Paraspinal and other soft tissues: Reported separately on CT abdomen and pelvis Disc levels: No visible impingement. IMPRESSION: No acute lumbar spine fracture. Electronically Signed   By: Caprice Renshaw M.D.   On: 01/04/2021 14:13   DG Chest Portable 1 View  Result Date: 01/04/2021 CLINICAL DATA:  Weakness, falls EXAM: PORTABLE CHEST 1 VIEW COMPARISON:  Chest radiograph 02/11/2013 FINDINGS: The patient tubing is seen over the right hemithorax. A focus of calcification along the right aspect of the shunt projecting over the right apex is unchanged. The cardiomediastinal silhouette is normal. There is no focal consolidation or pulmonary edema. There is no pleural effusion or pneumothorax. Slight levocurvature of the upper thoracic spine is noted. There is no acute osseous abnormality. IMPRESSION: No radiographic evidence of acute cardiopulmonary process. Electronically Signed   By: Lesia Hausen M.D.   On: 01/04/2021 12:51    ____________________________________________   PROCEDURES  Procedure(s) performed (including Critical Care):  .Critical Care Performed by: Gilles Chiquito, MD Authorized by: Gilles Chiquito, MD   Critical care provider statement:    Critical care time (minutes):  45   Critical care was necessary to treat or prevent imminent or life-threatening  deterioration of the following conditions:  Shock and circulatory failure   Critical care was time spent personally by me on the following activities:  Discussions with consultants, evaluation  of patient's response to treatment, examination of patient, ordering and performing treatments and interventions, ordering and review of laboratory studies, ordering and review of radiographic studies, pulse oximetry, re-evaluation of patient's condition, obtaining history from patient or surrogate and review of old charts   ____________________________________________   INITIAL IMPRESSION / ASSESSMENT AND PLAN / ED COURSE        Patient presents with above-stated history exam for assessment of recurrent falls including 1 prior to arrival today and some back pain in the setting of some global weakness and at least subacute to chronic generalized abdominal pain.  Patient is a fairly poor historian states she has been feeling awful since she had COVID several months ago.  She is not oriented on arrival.  She does have some tenderness over her mid and lower back and is Hemoccult positive but is no active bleeding.  She does appear quite pale.  Also noted to have a disconjugate gaze.  Initial differential is quite broad as he on arrival she is bradycardic and hypotensive with BP of 81/66  With otherwise stable vital signs on room air.  Differential includes injuries from one of her prior falls precipitating today including possible anemia from hemorrhagic shock from intracranial hemorrhage, visceral hematoma or brain injury, VP shunt malfunction, shunt low blood pressure and shock from sepsis from unknown source at this time, severe anemia, arrhythmia, metabolic or electrolyte derangements, endocrine derangements with lower shin for toxic ingestion at this time.  Given low blood pressure arrival patient newly started on 1 L of IV fluid bolus.  Given recurrent falls and patient not being oriented with disconjugate  gaze full trauma pan scans ordered.  ECG with some nonspecific ST changes in inferior leads without other significant arrhythmia.  Chest x-ray without focal consolidation, effusion, pneumothorax, rib fracture or other clear acute intrathoracic process.  CBC remarkable for WBC count of 30.2, hemoglobin of 2.7 and platelets of 721.  Lactic acid elevated 3.6.  COVID and influenza PCR is negative.  Lipase within normal limits and not consistent with pancreatitis.  hCG is negative.  Troponin is nonelevated and overall not consistent with ACS at this time.  CK is within normal limits and not consistent with rhabdomyolysis.  CMP remarkable for K of 3.4 without any other significant electrolyte or metabolic derangements.  INR is 1.2.  PTT within normal limits.  1 patient's hemoglobin resulted fluid resuscitation halted and MTP ordered.  In addition blood cultures ordered and dose of Zosyn given somewhat difficult to exclude sepsis at this time although given she is Hemoccult positive and fairly anemic and concern for hemorrhagic shock either from GI bleed or trauma.  Care of patient assumed by Dr. Roxan Hockey pending full trauma scans and reassessment.  Patient will be started on Protonix.     ____________________________________________   FINAL CLINICAL IMPRESSION(S) / ED DIAGNOSES  Final diagnoses:  Fall  Hemorrhagic shock (HCC)  Altered mental status, unspecified altered mental status type    Medications  0.9 %  sodium chloride infusion (has no administration in time range)  piperacillin-tazobactam (ZOSYN) IVPB 3.375 g (has no administration in time range)  pantoprazole (PROTONIX) 80 mg /NS 100 mL IVPB (has no administration in time range)  pantoprozole (PROTONIX) 80 mg /NS 100 mL infusion (has no administration in time range)  pantoprazole (PROTONIX) injection 40 mg (has no administration in time range)  0.9 %  sodium chloride infusion (has no administration in time range)  lactated ringers  bolus 1,000 mL (1,000 mLs  Intravenous New Bag/Given 01/04/21 1243)  lactated ringers bolus 1,000 mL (1,000 mLs Intravenous New Bag/Given 01/04/21 1253)  iohexol (OMNIPAQUE) 350 MG/ML injection 75 mL (75 mLs Intravenous Contrast Given 01/04/21 1324)     ED Discharge Orders     None        Note:  This document was prepared using Dragon voice recognition software and may include unintentional dictation errors.    Gilles Chiquito, MD 01/04/21 [phone]

## 2021-01-04 NOTE — ED Notes (Signed)
Attempted to give report to Ginger in ICU, stated could not take report at this time and would call this RN back.

## 2021-01-04 NOTE — H&P (Signed)
NAME:  Carolyn Herring, MRN:  [id number], DOB:  1975-11-27, LOS: 0 ADMISSION DATE:  01/04/2021, CONSULTATION DATE: 01/04/2021 REFERRING MD: Dr. Katrinka Blazing, CHIEF COMPLAINT: Fall    History of Present Illness:  This is a 45 yo female with a PMH of HTN, Hydrocephalus, and 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 last revised in 2019.  She has not seen a neurosurgeon in 3 yrs.  She presented to [facility] ER on 10/10 via EMS from home with recurrent falls, generalized weakness, and back pain.  She is unsure if she has been hitting her head during the falls.  She also endorsed several week hx of abdominal pain and diarrhea.  She was diagnosed with COVID-19 in 08/2020, and has had worsening generalized weakness since the dx.  She has not seen a PCP for evaluation of symptoms.  She does live alone, but her aunt lives next door.  On 10/10 pt able to notify EMS for assistance, however when they arrived at her home she was unable to answer the door.  Therefore,  EMS had to break her door down to gain entry.    ED course  Upon arrival to the ER pt bradycardic hr 57 and hypotensive sbp 60-90's.  She received 2L LR bolus with sbp increasing to 100's.  Pt also encephalopathic.  EKG revealed sinus tachycardia with nonspecific ST changes in inferior leads. Lab results revealed K+ 3.4, glucose 144, albumin 2.1, lactic acid 3.6, pct 24.93, wbc 30.1, hgb 2.7, hct 10.4, and stool occult positive.  Pt does alternate ibuprofen and advil every 4 to 6 hrs for abdominal pain.  She also reports heavy menstrual cycles.  Massive blood transfusion protocol activated and pt received: 2 units of pRBC's and 1 unit of FFP.  Protonix bolus followed by protonix gtt initiated.  COVID-19/Influenza A&B and CXR negative.   Due to concern of sepsis pt received empiric zosyn.  PCCM contacted for ICU admission.    Pertinent  Medical History  HTN Hydrocephalus Spina Bifida s/p VP shunt with cervical tethered cord release  in 2011 last revised in 2019  Significant Hospital Events: Including procedures, antibiotic start and stop dates in addition to other pertinent events   10/10: Pt admitted to ICU with acute encephalopathy concerning for acute CVA, hemorrhagic shock, possible sepsis, and acute hypoxic respiratory failure secondary to possible aspiration pneumonia   Significant Test:  CT Head/Cervical Spine 10/10>>New hypodense right subdural collection measuring 0.9 cm, without evidence of acute blood products. Underlying loss of gray-white matter differentiation in the right parietal lobe, concerning for acute-subacute infarct. Recommend MRI. No acute cervical spine fracture. Unchanged chronic congenital and postoperative changes. CT Chest/Abd/Pelvis 10/10>>No evidence of acute trauma in the chest. Focal peripheral hypodensity in the upper posterior aspect of the spleen, morphology favoring small cyst, hemangioma, or possibly splenic infarct, and felt unlikely to be acute trauma. No other acute findings in the abdomen or pelvis.  Micro Data:  Respiratory Panel by RT-PCR 10/10>>negative  Blood 10/10>>negative   Antimicrobial:  Zosyn 10/10 x1 dose Unasyn 10/10>>  Interim History / Subjective:  No complaints at this time   Objective   Blood pressure (!) 145/86, pulse (!) 120, temperature 98.4 F (36.9 C), temperature source Oral, resp. rate (!) 22, height 5' (1.524 m), weight 53.5 kg, SpO2 92 %.        Intake/Output Summary (Last 24 hours) at 01/04/2021 1540 Last data filed at 01/04/2021 1514 Gross per 24 hour  Intake  2110.5 ml  Output --  Net 2110.5 ml   Filed Weights   01/04/21 1140  Weight: 53.5 kg    Examination: General: chronically ill appearing female, NAD resting in bed  HENT: supple, no JVD  Lungs: expiratory and inspiratory wheezes throughout, even, non labored  Cardiovascular: sinus tachycardia, s1s2, no R/G, 2+ radial/1+ distal  Abdomen: +BS x4, soft, non tender, non distended   Extremities: left hand contracted, moves all extremities  Neuro: alert and oriented, follows commands, PERRL, disconjugate gaze GU: voiding   Resolved Hospital Problem list     Assessment & Plan:   Hemorrhagic shock likely secondary to NSAID use~hgb 2.7 and occult stool positive  - Continuous telemetry monitoring  - Trend troponin  - Aggressive iv fluid resuscitation to maintain map >65  - Serial H&H - DIC panel, haptoglobin, iron/TIBC, reticulocytes, vitamin B12, ferritin, and pathologist smear review pending  - Monitor for s/sx of bleeding - Transfuse for hgb <7 - Continue iv protonix - Avoid all NSAID's  - GI consulted appreciate input   Hypokalemia  - Trend BMP  - Replace electrolytes as indicated  - Monitor UOP   Lactic acidosis  - Trend lactic acid   Acute hypoxic respiratory failure secondary to possible aspiration pneumonia~pct 24.93  Hx: Current everyday smoker  - Prn supplemental O2 for dyspnea and/or hypoxia  - Scheduled and prn bronchodilator therapy  - IV steroids  - Trend WBC and monitor fever curve  - Trend PCT  - Follow cultures  - Continue empiric unasyn  - Smoking cessation counseling provided  Acute encephalopathy~CT Head revealed new hypodense right subdural collection measuring 0.9 cm , without evidence of acute blood products and concern of acute-subacute infarct in the right parietal lobe 10/10 Hx: Hydrocephalus and spina Bifida s/p VP shunt with cervical tethered cord release in 2011 last revised in 2019 - Frequent reorientation  - MRI Brain/MRA Head/Neck for further assessment  - Neurology consulted appreciate input - Avoid sedating medication when able   Best Practice (right click and "Reselect all SmartList Selections" daily)   Diet/type: Clear liquids; NPO after midnight for EGD on 10/11 DVT prophylaxis: SCD's GI prophylaxis: PPI Lines: N/A Foley:  N/A Code Status:  full code Last date of multidisciplinary goals of care discussion  [N/A]  Labs   CBC: Recent Labs  Lab 01/04/21 1205  WBC 30.2*  NEUTROABS 27.0*  HGB 2.7*  HCT 10.4*  MCV 65.0*  PLT 721*    Basic Metabolic Panel: Recent Labs  Lab 01/04/21 1205  NA 135  K 3.4*  CL 103  CO2 23  GLUCOSE 144*  BUN 36*  CREATININE 0.86  CALCIUM 8.0*   GFR: Estimated Creatinine Clearance: 60 mL/min (by C-G formula based on SCr of 0.86 mg/dL). Recent Labs  Lab 01/04/21 1205  PROCALCITON 24.93  WBC 30.2*  LATICACIDVEN 3.6*    Liver Function Tests: Recent Labs  Lab 01/04/21 1205  AST 20  ALT 13  ALKPHOS 82  BILITOT 0.6  PROT 5.5*  ALBUMIN 2.1*   Recent Labs  Lab 01/04/21 1205  LIPASE 50   No results for input(s): AMMONIA in the last 168 hours.  ABG No results found for: PHART, PCO2ART, PO2ART, HCO3, TCO2, ACIDBASEDEF, O2SAT   Coagulation Profile: Recent Labs  Lab 01/04/21 1205  INR 1.2    Cardiac Enzymes: Recent Labs  Lab 01/04/21 1205  CKTOTAL 85    HbA1C: No results found for: HGBA1C  CBG: No results for input(s): GLUCAP in the  last 168 hours.  Review of Systems: Positives in BOLD   Gen: Denies fever, chills, weight change, fatigue, night sweats HEENT: Denies blurred vision, double vision, hearing loss, tinnitus, sinus congestion, rhinorrhea, sore throat, neck stiffness, dysphagia PULM: Denies shortness of breath, cough, sputum production, hemoptysis, wheezing CV: Denies chest pain, edema, orthopnea, paroxysmal nocturnal dyspnea, palpitations GI: abdominal and back pain, nausea, vomiting, diarrhea, hematochezia, melena, constipation, change in bowel habits GU: Denies dysuria, hematuria, polyuria, oliguria, urethral discharge Endocrine: Denies hot or cold intolerance, polyuria, polyphagia or appetite change Derm: Denies rash, dry skin, scaling or peeling skin change Heme: Denies easy bruising, bleeding, bleeding gums Neuro: falls, headache, numbness, weakness, slurred speech, loss of memory or consciousness  Past  Medical History:  She,  has a past medical history of Hypertension and Spina bifida (HCC).   Surgical History:  History reviewed. No pertinent surgical history.   Social History:   reports that she has been smoking. She has been smoking an average of .5 packs per day. She has never used smokeless tobacco. She reports that she does not drink alcohol and does not use drugs.   Family History:  Her family history is not on file.   Allergies Allergies  Allergen Reactions   Codeine Itching   Hydrogen Peroxide Other (See Comments)    Skin Irritation   Latex Other (See Comments)    Skin Irritation; blisters      Home Medications  Prior to Admission medications   Medication Sig Start Date End Date Taking? Authorizing Provider  diphenhydrAMINE (BENADRYL) 25 MG tablet Take 1 tablet (25 mg total) by mouth every 6 (six) hours as needed. 10/12/19   Sharman Cheek, MD  ibuprofen (ADVIL,MOTRIN) 600 MG tablet Take 1 tablet (600 mg total) by mouth every 8 (eight) hours as needed for moderate pain. 10/21/17   Menshew, Charlesetta Ivory, PA-C     Critical care time: 1 hour     Camie Patience, AGNP  Pulmonary/Critical Care Pager [phone] (please enter 7 digits) PCCM Consult Pager [phone] (please enter 7 digits)

## 2021-01-04 NOTE — Consult Note (Signed)
Neurology Consultation Reason for Consult: Abnormal head CT Referring Physician: Kasa, K  CC: Decreased responsiveness  History is obtained from: Patient, mother, chart review  HPI: Carolyn Herring is a 45 y.o. 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  She had a hemoglobin of two.  She reports that for the past 2 to 3 weeks, she has had intermittent episodes of lightheadedness and when she gets lightheadedness she also gets "spots in her vision" isolated to the right eye.  She was lethargic on arrival and therefore had a head CT which demonstrated a subdural fluid collection as well as a hypodense area underlying this fluid collection.  This was not present on a head CT in July 2021.  Possibly of note, the patient had a fall in early summer, she states that she was on the toilet and slipped off the side and hit her head.  She did not lose consciousness and her family reports that confused for some time afterwards.  They tried to convince her to seek care, but she refused at that time.  That is the only significant head injury in recent years.   LKW: Unclear tpa given?: no, unclear time of onset Premorbid modified rankin scale: Three   ROS: A 14 point ROS was performed and is negative except as noted in the HPI.   Past Medical History:  Diagnosis Date   Hypertension    Spina bifida (HCC)      Family history: Mother with GI issues   Social History:  reports that she has been smoking. She has been smoking an average of .5 packs per day. She has never used smokeless tobacco. She reports that she does not drink alcohol and does not use drugs.   Exam: Current vital signs: BP (!) 163/105   Pulse (!) 114   Temp 97.8 F (36.6 C) (Oral)   Resp 17   Ht 5' (1.524 m)   Wt 49.2 kg   SpO2 90%   BMI 21.18 kg/m  Vital signs in last 24 hours: Temp:  [97.8 F (36.6 C)-98.4 F  (36.9 C)] 97.8 F (36.6 C) (10/10 1700) Pulse Rate:  [57-126] 114 (10/10 1657) Resp:  [13-26] 17 (10/10 1700) BP: (67-163)/(39-109) 163/105 (10/10 1700) SpO2:  [79 %-100 %] 90 % (10/10 1657) Weight:  [49.2 kg-53.5 kg] 49.2 kg (10/10 1657)   Physical Exam  Constitutional: Appears well-developed and well-nourished.  Psych: Affect appropriate to situation Eyes: No scleral injection HENT: No OP obstruction MSK: no joint deformities.  Cardiovascular: Normal rate and regular rhythm.  Respiratory: Effort normal, non-labored breathing GI: Soft.  No distension. There is no tenderness.  Skin: WDI  Neuro: Mental Status: Patient is awake, alert, oriented to person, place, month, but she gives the year as "26" she gives her age is 73.  She does not have any definite neglect, but frequently mixes up her right and her left.  She is, able to identify if she is being touched on one side versus both sides.  Cranial Nerves: II: I question a mild left upper field cut. Pupils are equal, round, and reactive to light.   III,IV, VI: She has a right exotropia, full EOM and left eye V: Facial sensation is symmetric to temperature VII: Facial movement is symmetric.  Motor: She has a slightly spastic quadriparesis, but is able to lift all four extremities against gravity, slightly weaker in the  left arm than the right Sensory: Sensation is symmetric to light touch  in the arms and legs. Cerebellar: Consistent with weakness     I have reviewed labs in epic and the results pertinent to this consultation are: Creatinine 0.8  I have reviewed the images obtained: CT head-subdural fluid collection with underlying hypodensity on the right  Impression: 45 year old female who presents with severe anemia and symptomatic orthostasis with CT finding concerning for acute infarct.  Given the report of intermittent vision loss on the right associated with orthostasis, and concern for possible chronic vascular  insufficiency affecting the right hemisphere, with cerebral ischemia due to hypotension in the setting.  Another possibility would be a healing contusion associated with her fall and TBI back in the summer.    Recommendations: 1) MRI brain if her shunt is compatible and can be reprogrammed 2) given the GI bleed, clearly I would not favor antiplatelets or anticoagulants at the current time 3) MRA head and neck if her shunt is compatible 4) further work-up depending on results of above.   Ritta Slot, MD [facility] [phone]  If 7pm- 7am, please page neurology on call as listed in AMION.

## 2021-01-04 NOTE — ED Notes (Signed)
We are over 1 hr post PRBC transfusion on this pt and almost 1 hr post FFP transfusion on this pt. Pt began developing minor hive on L aspect of face down neck. Only medication running was protonix at 21ml/hr and pt already received a larger dosing of protonix with no reaction. 25 mg Benadryl IV administered at this time via ED MD order. Pt sats also began decreasing into low 80's, this RN placed this pt on 2L [place], sats in 90's now. MD Kasa made aware at this time.

## 2021-01-04 NOTE — Consult Note (Addendum)
Consultation  Referring Provider:  ICU Admit date: 10/10 Consult date: 10/10         Reason for Consultation: Anemia             HPI:   Carolyn Herring is a 45 y.o. lady with spina bifada who presented with lethargy, falls, and profound anemia noted on labs. Patient and family members provide history. States since getting COVID in July she has had a chronic abdominal pain that she has been medicating by grinding up advil and motrin and then taking them every few hours. Over the past month she has been having diarrhea with some she believes could be black in nature. Her mother and aunt have a history of peptic ulcers but no family history of GI malignancies. Patient has never had EGD or colonoscopy before. She does note that she also has been having some heavy menstrual cycles but thinks she might be going through menopause.   Past Medical History:  Diagnosis Date  . Hypertension   . Spina bifida (HCC)     History reviewed. No pertinent surgical history.  No family history on file. Family history of peptic ulcer disease  Social History   Tobacco Use  . Smoking status: Every Day    Packs/day: 0.50    Types: Cigarettes  . Smokeless tobacco: Never  Vaping Use  . Vaping Use: Never used  Substance Use Topics  . Alcohol use: Never  . Drug use: Never    Prior to Admission medications   Medication Sig Start Date End Date Taking? Authorizing Provider  diphenhydrAMINE (BENADRYL) 25 MG tablet Take 1 tablet (25 mg total) by mouth every 6 (six) hours as needed. 10/12/19   Sharman Cheek, MD  ibuprofen (ADVIL,MOTRIN) 600 MG tablet Take 1 tablet (600 mg total) by mouth every 8 (eight) hours as needed for moderate pain. 10/21/17   Menshew, Charlesetta Ivory, PA-C    Current Facility-Administered Medications  Medication Dose Route Frequency Provider Last Rate Last Admin  . Ampicillin-Sulbactam (UNASYN) 3 g in sodium chloride 0.9 % 100 mL IVPB  3 g Intravenous Q6H Deal, Adonis Housekeeper, RPH      .  ondansetron (ZOFRAN) injection 4 mg  4 mg Intravenous Q6H PRN Lianne Cure, NP      . Melene Muller ON 01/08/2021] pantoprazole (PROTONIX) injection 40 mg  40 mg Intravenous Q12H Willy Eddy, MD      . pantoprozole (PROTONIX) 80 mg /NS 100 mL infusion  8 mg/hr Intravenous Continuous Willy Eddy, MD 10 mL/hr at 01/04/21 1521 8 mg/hr at 01/04/21 1521  . potassium chloride SA (KLOR-CON) CR tablet 20 mEq  20 mEq Oral Once Deal, Adonis Housekeeper, [facility]       Current Outpatient Medications  Medication Sig Dispense Refill  . diphenhydrAMINE (BENADRYL) 25 MG tablet Take 1 tablet (25 mg total) by mouth every 6 (six) hours as needed. 30 tablet 0  . ibuprofen (ADVIL,MOTRIN) 600 MG tablet Take 1 tablet (600 mg total) by mouth every 8 (eight) hours as needed for moderate pain. 30 tablet 0    Allergies as of 01/04/2021 - Review Complete 01/04/2021  Allergen Reaction Noted  . Codeine Itching 10/21/2017  . Hydrogen peroxide Other (See Comments) 10/21/2017  . Latex Other (See Comments) 10/21/2017     Review of Systems:    All systems reviewed and negative except where noted in HPI.  Review of Systems  Constitutional:  Positive for malaise/fatigue. Negative for chills and fever.  Respiratory:  Negative  for cough.   Cardiovascular:  Negative for chest pain.  Gastrointestinal:  Positive for abdominal pain, diarrhea and melena. Negative for blood in stool, constipation, nausea and vomiting.  Musculoskeletal:  Positive for joint pain.  Skin:  Positive for rash.  Neurological:  Negative for focal weakness.  Psychiatric/Behavioral:  Negative for substance abuse.   All other systems reviewed and are negative.     Physical Exam:  Vital signs in last 24 hours: Temp:  [98.3 F (36.8 C)-98.4 F (36.9 C)] 98.4 F (36.9 C) (10/10 1526) Pulse Rate:  [57-126] 111 (10/10 1610) Resp:  [13-26] 24 (10/10 1610) BP: (67-145)/(39-109) 133/107 (10/10 1610) SpO2:  [79 %-100 %] 93 % (10/10 1611) Weight:  [53.5 kg]  53.5 kg (10/10 1140)   General:   Patient somewhat drowsy Head:  Normocephalic and atraumatic. Eyes:   No icterus.   Conjunctiva pink. Mouth: Mucosa pink moist, no lesions. Neck:  Supple; no masses felt Lungs:  No respiratory distress Heart:  RRR Abdomen:   Patient deferred Rectal:  Brown stool Msk:  No clubbing or cyanosis. Chronic left hand weakness due to spina bifada Neurologic:  Alert and oriented x4;  no focal deficits Skin:  Warm, dry, pink without significant lesions or rashes. Psych:  Alert and cooperative. Normal affect.  LAB RESULTS: Recent Labs    01/04/21 1205  WBC 30.2*  HGB 2.7*  HCT 10.4*  PLT 721*   BMET Recent Labs    01/04/21 1205  NA 135  K 3.4*  CL 103  CO2 23  GLUCOSE 144*  BUN 36*  CREATININE 0.86  CALCIUM 8.0*   LFT Recent Labs    01/04/21 1205  PROT 5.5*  ALBUMIN 2.1*  AST 20  ALT 13  ALKPHOS 82  BILITOT 0.6   PT/INR Recent Labs    01/04/21 1205  LABPROT 15.6*  INR 1.2    STUDIES: CT HEAD WO CONTRAST ( )  Result Date: 01/04/2021 CLINICAL DATA:  Neck trauma, dangerous injury mechanism (Age 54-64y); Head trauma, focal neuro findings (Age 4-64y) EXAM: CT HEAD WITHOUT CONTRAST CT CERVICAL SPINE WITHOUT CONTRAST TECHNIQUE: Multidetector CT imaging of the head and cervical spine was performed following the standard protocol without intravenous contrast. Multiplanar CT image reconstructions of the cervical spine were also generated. COMPARISON:  Head CT 10/12/2019, cervical spine CT 11/10/2012. FINDINGS: CT HEAD FINDINGS Brain: There is a new hypodense right subdural collection, without visible acute blood products.There is adjacent loss of gray-white matter attenuation in the right parietal lobe, concerning for ischemia.There is a right frontal approach ventriculostomy catheter with tip unchanged in position in the midline between the frontal horns. Vascular: No hyperdense vessel or unexpected calcification. Skull: Right frontal burr  hole. Prior suboccipital craniotomy. No acute fracture. Sinuses/Orbits: Paranasal sinuses are predominantly clear. Other: None. CT CERVICAL SPINE FINDINGS Alignment: Unchanged Skull base and vertebrae: No acute fracture. Soft tissues and spinal canal: VP shunt catheter courses down the right neck. No prevertebral soft tissue swelling. No visible canal hematoma. Disc levels: Enlargement of the spinal canal throughout the cervical spine. 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 Unchanged tubing within the spinal canal in comparison to prior exam in 2014. Upper chest: Negative. Other: None. IMPRESSION: Head CT: New hypodense right subdural collection measuring 0.9 cm, without evidence of acute blood products. Underlying loss of gray-white matter differentiation in the right parietal lobe, concerning for acute-subacute infarct. Recommend MRI. Cervical spine CT: No acute cervical spine fracture. Unchanged chronic congenital and postoperative  changes. Critical Value/emergent results were called by telephone at the time of interpretation on 01/04/2021 at 2:43 pm to provider [facility] , who verbally acknowledged these results. Electronically Signed   By: Caprice Renshaw M.D.   On: 01/04/2021 14:48   CT Cervical Spine Wo Contrast  Result Date: 01/04/2021 CLINICAL DATA:  Neck trauma, dangerous injury mechanism (Age 87-64y); Head trauma, focal neuro findings (Age 38-64y) EXAM: CT HEAD WITHOUT CONTRAST CT CERVICAL SPINE WITHOUT CONTRAST TECHNIQUE: Multidetector CT imaging of the head and cervical spine was performed following the standard protocol without intravenous contrast. Multiplanar CT image reconstructions of the cervical spine were also generated. COMPARISON:  Head CT 10/12/2019, cervical spine CT 11/10/2012. FINDINGS: CT HEAD FINDINGS Brain: There is a new hypodense right subdural collection, without visible acute blood products.There is adjacent loss of gray-white matter attenuation in the  right parietal lobe, concerning for ischemia.There is a right frontal approach ventriculostomy catheter with tip unchanged in position in the midline between the frontal horns. Vascular: No hyperdense vessel or unexpected calcification. Skull: Right frontal burr hole. Prior suboccipital craniotomy. No acute fracture. Sinuses/Orbits: Paranasal sinuses are predominantly clear. Other: None. CT CERVICAL SPINE FINDINGS Alignment: Unchanged Skull base and vertebrae: No acute fracture. Soft tissues and spinal canal: VP shunt catheter courses down the right neck. No prevertebral soft tissue swelling. No visible canal hematoma. Disc levels: Enlargement of the spinal canal throughout the cervical spine. 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 Unchanged tubing within the spinal canal in comparison to prior exam in 2014. Upper chest: Negative. Other: None. IMPRESSION: Head CT: New hypodense right subdural collection measuring 0.9 cm, without evidence of acute blood products. Underlying loss of gray-white matter differentiation in the right parietal lobe, concerning for acute-subacute infarct. Recommend MRI. Cervical spine CT: No acute cervical spine fracture. Unchanged chronic congenital and postoperative changes. Critical Value/emergent results were called by telephone at the time of interpretation on 01/04/2021 at 2:43 pm to provider [facility] , who verbally acknowledged these results. Electronically Signed   By: Caprice Renshaw M.D.   On: 01/04/2021 14:48   CT CHEST ABDOMEN PELVIS W CONTRAST  Result Date: 01/04/2021 CLINICAL DATA:  recurrent falls, VP shunt present EXAM: CT CHEST, ABDOMEN, AND PELVIS WITH CONTRAST TECHNIQUE: Multidetector CT imaging of the chest, abdomen and pelvis was performed following the standard protocol during bolus administration of intravenous contrast. CONTRAST:  29mL OMNIPAQUE IOHEXOL 350 MG/ML SOLN COMPARISON:  None. FINDINGS: CT CHEST FINDINGS Cardiovascular: Normal  cardiac size.No pericardial disease.Normal size main and branch pulmonary arteries.The thoracic aorta is unremarkable. Mediastinum/Nodes: No lymphadenopathy.The thyroid is unremarkable.Esophagus is unremarkable.The trachea is unremarkable. Lungs/Pleura: No focal airspace consolidation.No suspicious pulmonary nodules or masses.No pleural effusion.No pneumothorax. 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 Musculoskeletal: No acute osseous abnormality.No suspicious lytic or blastic lesions. Congenital enlarged spinal canal. CT ABDOMEN PELVIS FINDINGS Hepatobiliary: No hepatic injury or perihepatic hematoma. No gallstones, gallbladder wall thickening, or biliary dilatation. Pancreas: Unremarkable. No pancreatic ductal dilatation or surrounding inflammatory changes. Spleen: Focal peripheral somewhat wedge-shaped hypodensity in the upper posterior aspect of the spleen, morphology favoring small cyst, hemangioma, or possibly infarct (series 2, image 47) rather than laceration. Adrenals/Urinary Tract: No adrenal hemorrhage or renal injury identified. Bladder is unremarkable. No hydronephrosis or nephrolithiasis. No renal laceration. Subcentimeter hypodense right renal lesion, likely cyst. The bladder is minimally distended but unremarkable. Stomach/Bowel: The stomach is within normal limits. There is no evidence of bowel obstruction. No evidence of appendicitis. Vascular/Lymphatic:  Diffuse atherosclerotic calcified and noncalcified plaque throughout the abdominal aorta, which appears diminutive distally. Reproductive: Unremarkable. Other: No abdominal wall hernia or abnormality. No abdominopelvic ascites. VP shunt tubing terminates in the pelvis. Musculoskeletal: No acute osseous abnormality. Mild bilateral hip osteoarthritis. Diffuse muscle atrophy. Congenital enlargement of the spinal canal. IMPRESSION: No evidence of acute trauma in the chest. Focal peripheral hypodensity in the upper posterior aspect of the  spleen, morphology favoring small cyst, hemangioma, or possibly splenic infarct, and felt unlikely to be acute trauma. No other acute findings in the abdomen or pelvis. Electronically Signed   By: Caprice Renshaw M.D.   On: 01/04/2021 14:56   CT T-SPINE NO CHARGE  Result Date: 01/04/2021 CLINICAL DATA:  Fall EXAM: CT THORACIC SPINE WITHOUT CONTRAST TECHNIQUE: Multidetector CT images of the thoracic were obtained using the standard protocol without intravenous contrast. COMPARISON:  None. FINDINGS: Alignment: Normal. Vertebrae: No acute fracture or focal pathologic process. Paraspinal and other soft tissues: Reported separately on chest CT. Disc levels: Preserved disc heights. Chronic enlargement of the spinal canal. IMPRESSION: No acute thoracic spine fracture. Electronically Signed   By: Caprice Renshaw M.D.   On: 01/04/2021 14:18   CT L-SPINE NO CHARGE  Result Date: 01/04/2021 CLINICAL DATA:  Fall EXAM: CT LUMBAR SPINE WITHOUT CONTRAST TECHNIQUE: Multidetector CT imaging of the lumbar spine was performed without intravenous contrast administration. Multiplanar CT image reconstructions were also generated. COMPARISON:  None. FINDINGS: Segmentation: 5 lumbar type vertebrae. Alignment: Normal Vertebrae: No acute fracture or focal pathologic process. Paraspinal and other soft tissues: Reported separately on CT abdomen and pelvis Disc levels: No visible impingement. IMPRESSION: No acute lumbar spine fracture. Electronically Signed   By: Caprice Renshaw M.D.   On: 01/04/2021 14:13   DG Chest Portable 1 View  Result Date: 01/04/2021 CLINICAL DATA:  Weakness, falls EXAM: PORTABLE CHEST 1 VIEW COMPARISON:  Chest radiograph 02/11/2013 FINDINGS: The patient tubing is seen over the right hemithorax. A focus of calcification along the right aspect of the shunt projecting over the right apex is unchanged. The cardiomediastinal silhouette is normal. There is no focal consolidation or pulmonary edema. There is no pleural  effusion or pneumothorax. Slight levocurvature of the upper thoracic spine is noted. There is no acute osseous abnormality. IMPRESSION: No radiographic evidence of acute cardiopulmonary process. Electronically Signed   By: Lesia Hausen M.D.   On: 01/04/2021 12:51       Impression / Plan:   45 y/o lady with spina bifada and chronic NSAID use who presents with severe anemia and possible history of melena. Rectal exam today with brown stool.  - IV PPI BID or gtt - transfuse for hemoglobin > 7 - replace electrolytes - ordered iron studies and hemolysis labs - avoid NSAIDS - avoid any prophylactic heparin - will tentatively plan for EGD tomorrow if hemoglobin stable and patient clinically stable - clear liquids now and NPO at midnight  Please call if any questions or concerns.  Merlyn Lot MD, MPH [facility]

## 2021-01-04 NOTE — ED Triage Notes (Signed)
Presents form home via ems   states she was been getting weaker since having COVID   states she fell this am   was unable to get up  afebrile on arrival

## 2021-01-04 NOTE — Consult Note (Signed)
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 Pharmacy is consulted for electrolyte replacement.   Goal of Therapy:  Electrolytes within normal limits   Plan:  --Will give Kcl PO x1 --No other electrolyte replacement indicated at this time --Will f/u with AM labs and assess need for additional replacement  Doloris Hall, PharmD Pharmacy Resident  01/04/2021 4:11 PM

## 2021-01-04 NOTE — ED Notes (Signed)
Provider notified of IV placement in pt right ankle. Due to pt low BP, that was the only place for a IV. Provider is attempting to continue on looking for better placement.

## 2021-01-04 NOTE — Consult Note (Signed)
Pharmacy Antibiotic Note  Carolyn Herring is a 45 y.o. 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  Pharmacy has been consulted for Unasyn dosing.  Afeb since admission, WBC 30.2, procal 24.93  Bcx sent  Pt noted to have received 1 dose of zosyn at 1520  Plan: Unasyn --Will start Unasyn 3g IV q6h  Will continue to monitor renal function and adjust dose as clinically indicated.   Height: 5' (152.4 cm) Weight: 53.5 kg (117 lb 15.1 oz) IBW/kg (Calculated) : 45.5  Temp (24hrs), Avg:98.4 F (36.9 C), Min:98.3 F (36.8 C), Max:98.4 F (36.9 C)  Recent Labs  Lab 01/04/21 1205  WBC 30.2*  CREATININE 0.86  LATICACIDVEN 3.6*    Estimated Creatinine Clearance: 60 mL/min (by C-G formula based on SCr of 0.86 mg/dL).    Allergies  Allergen Reactions   Codeine Itching   Hydrogen Peroxide Other (See Comments)    Skin Irritation   Latex Other (See Comments)    Skin Irritation; blisters     Antimicrobials this admission: Ongoing Unasyn 10/10 >>  Completed 10/10 Zosyn x1      Microbiology results: 10/10 BCx: sent  Thank you for allowing pharmacy to be a part of this patient's care.  Doloris Hall, PharmD Pharmacy Resident  01/04/2021 4:01 PM

## 2021-01-04 NOTE — ED Notes (Signed)
GI in room at this time.

## 2021-01-05 ENCOUNTER — Encounter: Payer: Self-pay | Admitting: Anesthesiology

## 2021-01-05 ENCOUNTER — Inpatient Hospital Stay (HOSPITAL_COMMUNITY)
Admit: 2021-01-05 | Discharge: 2021-01-05 | Disposition: A | Discharge status: Home / Self Care | Payer: Medicaid Other | Attending: Neurology | Admitting: Neurology

## 2021-01-05 ENCOUNTER — Inpatient Hospital Stay: Payer: Medicaid Other | Admitting: Anesthesiology

## 2021-01-05 ENCOUNTER — Encounter
Admission: EM | Disposition: A | Discharge status: Skilled Nursing Facility | Payer: Self-pay | Source: Home / Self Care | Attending: Internal Medicine

## 2021-01-05 DIAGNOSIS — I6389 Other cerebral infarction: Secondary | ICD-10-CM | POA: Diagnosis not present

## 2021-01-05 DIAGNOSIS — Z982 Presence of cerebrospinal fluid drainage device: Secondary | ICD-10-CM | POA: Diagnosis not present

## 2021-01-05 DIAGNOSIS — W19XXXA Unspecified fall, initial encounter: Secondary | ICD-10-CM | POA: Diagnosis not present

## 2021-01-05 DIAGNOSIS — R4182 Altered mental status, unspecified: Secondary | ICD-10-CM

## 2021-01-05 DIAGNOSIS — R578 Other shock: Secondary | ICD-10-CM | POA: Diagnosis not present

## 2021-01-05 HISTORY — PX: ESOPHAGOGASTRODUODENOSCOPY (EGD) WITH PROPOFOL: SHX5813

## 2021-01-05 LAB — BLOOD CULTURE ID PANEL (REFLEXED) - BCID2
A.calcoaceticus-baumannii: NOT DETECTED
Bacteroides fragilis: NOT DETECTED
Candida albicans: NOT DETECTED
Candida auris: NOT DETECTED
Candida glabrata: NOT DETECTED
Candida krusei: DETECTED — AB
Candida parapsilosis: NOT DETECTED
Candida tropicalis: NOT DETECTED
Cryptococcus neoformans/gattii: NOT DETECTED
Enterobacter cloacae complex: NOT DETECTED
Enterobacterales: NOT DETECTED
Enterococcus Faecium: NOT DETECTED
Enterococcus faecalis: NOT DETECTED
Escherichia coli: NOT DETECTED
Haemophilus influenzae: NOT DETECTED
Klebsiella aerogenes: NOT DETECTED
Klebsiella oxytoca: NOT DETECTED
Klebsiella pneumoniae: NOT DETECTED
Listeria monocytogenes: NOT DETECTED
Neisseria meningitidis: NOT DETECTED
Proteus species: NOT DETECTED
Pseudomonas aeruginosa: NOT DETECTED
Salmonella species: NOT DETECTED
Serratia marcescens: NOT DETECTED
Staphylococcus aureus (BCID): NOT DETECTED
Staphylococcus epidermidis: NOT DETECTED
Staphylococcus lugdunensis: NOT DETECTED
Staphylococcus species: NOT DETECTED
Stenotrophomonas maltophilia: NOT DETECTED
Streptococcus agalactiae: NOT DETECTED
Streptococcus pneumoniae: NOT DETECTED
Streptococcus pyogenes: NOT DETECTED
Streptococcus species: NOT DETECTED

## 2021-01-05 LAB — IRON AND TIBC
Iron: 31 ug/dL (ref 28–170)
Saturation Ratios: 10 % — ABNORMAL LOW (ref 10.4–31.8)
TIBC: 322 ug/dL (ref 250–450)
UIBC: 291 ug/dL

## 2021-01-05 LAB — CBC
HCT: 34.7 % — ABNORMAL LOW (ref 36.0–46.0)
Hemoglobin: 11.8 g/dL — ABNORMAL LOW (ref 12.0–15.0)
MCH: 25.7 pg — ABNORMAL LOW (ref 26.0–34.0)
MCHC: 34 g/dL (ref 30.0–36.0)
MCV: 75.4 fL — ABNORMAL LOW (ref 80.0–100.0)
Platelets: 372 10*3/uL (ref 150–400)
RBC: 4.6 MIL/uL (ref 3.87–5.11)
RDW: 21.8 % — ABNORMAL HIGH (ref 11.5–15.5)
WBC: 24.2 10*3/uL — ABNORMAL HIGH (ref 4.0–10.5)
nRBC: 1.7 % — ABNORMAL HIGH (ref 0.0–0.2)

## 2021-01-05 LAB — ECHOCARDIOGRAM COMPLETE
AR max vel: 1.95 cm2
AV Area VTI: 2.04 cm2
AV Area mean vel: 1.94 cm2
AV Mean grad: 6 mmHg
AV Peak grad: 11.3 mmHg
Ao pk vel: 1.68 m/s
Area-P 1/2: 6.96 cm2
Height: 60 in
MV VTI: 2.99 cm2
S' Lateral: 3.16 cm
Weight: 1710.77 oz

## 2021-01-05 LAB — HEMOGLOBIN AND HEMATOCRIT, BLOOD
HCT: 33.7 % — ABNORMAL LOW (ref 36.0–46.0)
Hemoglobin: 11.8 g/dL — ABNORMAL LOW (ref 12.0–15.0)

## 2021-01-05 LAB — FOLATE: Folate: 8 ng/mL (ref >5.9)

## 2021-01-05 LAB — LACTIC ACID, PLASMA: Lactic Acid, Venous: 2.1 mmol/L (ref 0.5–1.9)

## 2021-01-05 LAB — RETICULOCYTES
Immature Retic Fract: 36.5 % — ABNORMAL HIGH (ref 2.3–15.9)
RBC.: 4.42 MIL/uL (ref 3.87–5.11)
Retic Count, Absolute: 90.8 10*3/uL (ref 19.0–186.0)
Retic Ct Pct: 2 % (ref 0.4–3.1)

## 2021-01-05 LAB — VITAMIN B12: Vitamin B-12: 269 pg/mL (ref 180–914)

## 2021-01-05 LAB — PHOSPHORUS: Phosphorus: 2.9 mg/dL (ref 2.5–4.6)

## 2021-01-05 LAB — BASIC METABOLIC PANEL
Anion gap: 10 (ref 5–15)
BUN: 21 mg/dL — ABNORMAL HIGH (ref 6–20)
CO2: 21 mmol/L — ABNORMAL LOW (ref 22–32)
Calcium: 8.1 mg/dL — ABNORMAL LOW (ref 8.9–10.3)
Chloride: 107 mmol/L (ref 98–111)
Creatinine, Ser: 0.75 mg/dL (ref 0.44–1.00)
GFR, Estimated: >60 mL/min (ref >60)
Glucose, Bld: 122 mg/dL — ABNORMAL HIGH (ref 70–99)
Potassium: 4.1 mmol/L (ref 3.5–5.1)
Sodium: 138 mmol/L (ref 135–145)

## 2021-01-05 LAB — PATHOLOGIST SMEAR REVIEW

## 2021-01-05 LAB — LIPID PANEL
Cholesterol: 105 mg/dL (ref 0–200)
HDL: 15 mg/dL — ABNORMAL LOW (ref >40)
LDL Cholesterol: 45 mg/dL (ref 0–99)
Total CHOL/HDL Ratio: 7 RATIO
Triglycerides: 223 mg/dL — ABNORMAL HIGH (ref <150)
VLDL: 45 mg/dL — ABNORMAL HIGH (ref 0–40)

## 2021-01-05 LAB — MAGNESIUM: Magnesium: 2 mg/dL (ref 1.7–2.4)

## 2021-01-05 LAB — PROTIME-INR
INR: 1.1 (ref 0.8–1.2)
Prothrombin Time: 14 seconds (ref 11.4–15.2)

## 2021-01-05 LAB — HEMOGLOBIN A1C
Hgb A1c MFr Bld: 5.4 % (ref 4.8–5.6)
Mean Plasma Glucose: 108 mg/dL

## 2021-01-05 LAB — FERRITIN: Ferritin: 28 ng/mL (ref 11–307)

## 2021-01-05 LAB — PROCALCITONIN: Procalcitonin: 10.66 ng/mL

## 2021-01-05 LAB — MASSIVE TRANSFUSION PROTOCOL ORDER (BLOOD BANK NOTIFICATION)

## 2021-01-05 SURGERY — ESOPHAGOGASTRODUODENOSCOPY (EGD) WITH PROPOFOL
Anesthesia: General

## 2021-01-05 MED ORDER — SODIUM CHLORIDE 0.9 % IV SOLN
100.0000 mg | INTRAVENOUS | Status: DC
  Administered 2021-01-06 – 2021-01-18 (×12): 100 mg via INTRAVENOUS
  Filled 2021-01-05 (×13): qty 100

## 2021-01-05 MED ORDER — PROPOFOL 500 MG/50ML IV EMUL
INTRAVENOUS | Status: DC | PRN
  Administered 2021-01-05: 120 ug/kg/min via INTRAVENOUS

## 2021-01-05 MED ORDER — MORPHINE SULFATE (PF) 2 MG/ML IV SOLN
2.0000 mg | Freq: Once | INTRAVENOUS | Status: AC
  Administered 2021-01-06: 2 mg via INTRAVENOUS
  Filled 2021-01-05: qty 1

## 2021-01-05 MED ORDER — SODIUM CHLORIDE 0.9 % IV SOLN: INTRAVENOUS | Status: DC

## 2021-01-05 MED ORDER — MORPHINE SULFATE (PF) 2 MG/ML IV SOLN
2.0000 mg | Freq: Once | INTRAVENOUS | Status: AC
Start: 2021-01-05 — End: 2021-01-05
  Administered 2021-01-05: 2 mg via INTRAVENOUS
  Filled 2021-01-05: qty 1

## 2021-01-05 MED ORDER — SODIUM CHLORIDE 0.9 % IV SOLN
200.0000 mg | Freq: Once | INTRAVENOUS | Status: AC
  Administered 2021-01-05: 200 mg via INTRAVENOUS
  Filled 2021-01-05: qty 200

## 2021-01-05 NOTE — Progress Notes (Addendum)
PROGRESS NOTE    Carolyn Herring  PYP:[id number] DOB: December 01, 1975 DOA: 01/04/2021 PCP: Patient, No Pcp Per (Inactive)   Brief Narrative: Taken from prior notes. 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  She has not seen a neurosurgeon in 3 yrs.  She presented to [facility] ER on 10/10 via EMS from home with recurrent falls, generalized weakness, and back pain.  She is unsure if she has been hitting her head during the falls.  She also endorsed several week hx of abdominal pain and diarrhea.  She was diagnosed with COVID-19 in 08/2020, and has had worsening generalized weakness since the dx.  She has not seen a PCP for evaluation of symptoms.  She does live alone, but her aunt lives next door.  Of note pt able to notify EMS for assistance, however when they arrived at her home she was unable to answer the door.  Therefore,  EMS had to break her door down to gain entry.    On EMS arrival patient was hypotensive with systolic in 24P, heart rate 57.  Blood pressure improved with 2 L of LR bolus.  Lactic acidosis at 3.6, procalcitonin at 24.93, WBCs of 30.1, hemoglobin of 2.7, hematocrit of 10.4 and stool occult positive.  Patient was using Goody powder and ibuprofen frequently for abdominal pain.  Massive blood transfusion protocol activated on arrival to ED but patient only received 2 units of PRBC and 1 unit of FFP.  Hemoglobin improved to 11.8, doubtful that original reading was Real, might be some lab error.  Last hemoglobin in the chart was 14.4 in July 2021. She was started on Zosyn empirically for a possible intra-abdominal infection.  CT abdomen and pelvis was without any acute significant abnormality.  There was a focal peripheral hypodensity in the upper posterior aspect of the spleen, possibly a small cyst, hemangioma or a possible small splenic infarct.  No other abnormality found. Blood cultures  negative in 24 hours, COVID-19 and influenza PCR negative, MRSA swab negative. Chest x-ray without any acute abnormality.  CT head and cervical spine with new hyperdense right subdural collection, without evidence of acute blood products.  There was an underlying loss of gray-white matter differentiation in the right parietal lobe concerning for acute-subacute infarct.  No new focal deficit.  Neurology was consulted and they are recommending MRI and MRA if her shunt device is compatible. GI was also consulted and she is going for EGD today. Patient initially admitted to ICU, TRH to resume care on 01/05/2021  10/11 : patient appears to be at baseline.  Patient denies any obvious bleeding, hematemesis, melena or hematochezia.  She was having some diarrhea for some time.  She is pretty much independent for ADLs and walk with the help of walker.  Lives alone with family keep checking on her.  They are not lives next door.  She denies any fever or chills.  She denies any menorrhagia, stating that her periods are becoming more scant, she thinks she is approaching menopause. Going for EGD later today.  Blood culture came back positive for fungal infection, patient has fungemia, ID was automatically consulted and she was started on Eraxis.  Patient also has shunt in place.  Subjective: Patient was seen and examined today.  She thinks that she is close to her baseline, continue to feel little weak.  Did not had any more diarrhea, she  denies any obvious bleeding which includes hematemesis, melena or hematochezia.  Her menstrual cycle is becoming scant, she thinks that she is approaching menopause.  She denies any menorrhagia.  Assessment & Plan:   Active Problems:   Hemorrhagic shock ([place])   Fall   Altered mental status   History of brain shunt  Septic/hemorrhagic shock/GI bleed.  Quite confusing picture, patient only received 2 unit of PRBC and 1 unit of FFP with unusually significant improvement in  her hemoglobin to 11.8, I really thinks that initial reading was a lab error.  Going for EGD today, history of significant NSAID use. She also met sepsis criteria with marked leukocytosis, tachycardia on presenting to ED, and mild lactic acidosis which is improving.  Procalcitonin markedly elevated.  No obvious source of infection yet, UA with leukocytosis, history of abdominal pain and diarrhea for some time, CT abdomen without any significant abnormality.  Blood cultures pending, added urine culture. She initially received Zosyn which was transitioned to Unasyn.  Procalcitonin, leukocytosis and lactic acidosis trending down. -Continue with Unasyn -Follow-up EGD findings -Follow-up blood and urine cultures. -Monitor hemoglobin and hold off to more transfusions at this time. -Monitor procalcitonin and CBC -Can do GI panel if more diarrhea.  Generalized weakness and falls.  Since prior COVID infection in June 2022. -PT/OT evaluation  Abnormal CT head.  There was some concern of new hyperdense right subdural collection, without evidence of acute blood products and a concern of acute-subacute infarct in the right parietal lobe. Neurology was consulted.  No new focal deficit.  Patient has baseline less strength on left. Neurology is recommending MRI and MRAs for further evaluation if her shunt is compatible. Patient has an history of hydrocephalus with spina bifid s/p VP shunt with cervical teetered cord release in 2011, last revised in 2019. Currently appears at baseline.  Objective: Vitals:   01/05/21 1345 01/05/21 1355 01/05/21 1400 01/05/21 1405  BP: (!) 142/99 (!) 153/117 (!) 153/117   Pulse: 94  87   Resp: 14  15   Temp: (!) 97.5 F (36.4 C)   97.7 F (36.5 C)  TempSrc: Temporal   Oral  SpO2: 98%  (!) 89% 96%  Weight:      Height:        Intake/Output Summary (Last 24 hours) at 01/05/2021 1541 Last data filed at 01/05/2021 1441 Gross per 24 hour  Intake 497.86 ml  Output 750  ml  Net -252.14 ml    Filed Weights   01/04/21 1140 01/04/21 1657 01/05/21 0426  Weight: 53.5 kg 49.2 kg 48.5 kg    Examination:  General exam: Frail tiny lady, appears calm and comfortable  Respiratory system: Clear to auscultation. Respiratory effort normal. Cardiovascular system: S1 & S2 heard, RRR.  Gastrointestinal system: Soft, nontender, nondistended, bowel sounds positive. Central nervous system: Alert and oriented. No focal neurological deficits. Extremities: No edema, no cyanosis, pulses intact and symmetrical. Psychiatry: Judgement and insight appear normal.   DVT prophylaxis: SCDs, concern of GI bleed Code Status: Full Family Communication:  Disposition Plan:  Status is: Inpatient  Remains inpatient appropriate because:Inpatient level of care appropriate due to severity of illness  Dispo: The patient is from: Home              Anticipated d/c is to: Home              Patient currently is not medically stable to d/c.   Difficult to place patient No  Level of care: [facility]  All the records are reviewed and case discussed with Care Management/Social Worker. Management plans discussed with the patient, nursing and they are in agreement.  Consultants:  GI PCCM  Procedures:  Antimicrobials:  Unasyn  Data Reviewed: I have personally reviewed following labs and imaging studies  CBC: Recent Labs  Lab 01/04/21 1205 01/04/21 1458 01/04/21 1547 01/04/21 1915 01/05/21 0132 01/05/21 0540  WBC 30.2*  --   --   --   --  24.2*  NEUTROABS 27.0*  --   --   --   --   --   HGB 2.7* 11.1*  --  11.6* 11.8* 11.8*  HCT 10.4* 32.1*  --  33.2* 33.7* 34.7*  MCV 65.0*  --   --   --   --  75.4*  PLT 721*  --  372 406*  --  494    Basic Metabolic Panel: Recent Labs  Lab 01/04/21 1205 01/05/21 0540  NA 135 138  K 3.4* 4.1  CL 103 107  CO2 23 21*  GLUCOSE 144* 122*  BUN 36* 21*  CREATININE 0.86 0.75  CALCIUM 8.0* 8.1*  MG  --  2.0  PHOS  --  2.9     GFR: Estimated Creatinine Clearance: 64.5 mL/min (by C-G formula based on SCr of 0.75 mg/dL). Liver Function Tests: Recent Labs  Lab 01/04/21 1205  AST 20  ALT 13  ALKPHOS 82  BILITOT 0.6  PROT 5.5*  ALBUMIN 2.1*    Recent Labs  Lab 01/04/21 1205  LIPASE 50    No results for input(s): AMMONIA in the last 168 hours. Coagulation Profile: Recent Labs  Lab 01/04/21 1205 01/04/21 1547 01/04/21 1915 01/05/21 0540  INR 1.2 1.2 1.1 1.1    Cardiac Enzymes: Recent Labs  Lab 01/04/21 1205  CKTOTAL 85    BNP (last 3 results) No results for input(s): PROBNP in the last 8760 hours. HbA1C: No results for input(s): HGBA1C in the last 72 hours. CBG: Recent Labs  Lab 01/04/21 1704  GLUCAP 100*    Lipid Profile: Recent Labs    01/05/21 0540  CHOL 105  HDL 15*  LDLCALC 45  TRIG 223*  CHOLHDL 7.0    Thyroid Function Tests: No results for input(s): TSH, T4TOTAL, FREET4, T3FREE, THYROIDAB in the last 72 hours. Anemia Panel: Recent Labs    01/04/21 1458 01/04/21 1547 01/04/21 1915 01/05/21 0538 01/05/21 1417  VITAMINB12  --   --  364  --   --   FOLATE  --  21.5  --  8.0  --   FERRITIN  --  21  --  28  --   TIBC  --  255  --  322  --   IRON  --  53  --  31  --   RETICCTPCT 1.8  --   --   --  2.0    Sepsis Labs: Recent Labs  Lab 01/04/21 1205 01/04/21 1547 01/05/21 0538 01/05/21 0540  PROCALCITON 24.93  --  10.66  --   LATICACIDVEN 3.6* 2.5*  --  2.1*     Recent Results (from the past 240 hour(s))  Resp Panel by RT-PCR (Flu A&B, Covid) Nasopharyngeal Swab     Status: None   Collection Time: 01/04/21 12:05 PM   Specimen: Nasopharyngeal Swab; Nasopharyngeal(NP) swabs in vial transport medium  Result Value Ref Range Status   SARS Coronavirus 2 by RT PCR NEGATIVE NEGATIVE Final    Comment: (NOTE) SARS-CoV-2  target nucleic acids are NOT DETECTED.  The SARS-CoV-2 RNA is generally detectable in upper respiratory specimens during the acute phase  of infection. The lowest concentration of SARS-CoV-2 viral copies this assay can detect is 138 copies/mL. A negative result does not preclude SARS-Cov-2 infection and should not be used as the sole basis for treatment or other patient management decisions. A negative result may occur with  improper specimen collection/handling, submission of specimen other than nasopharyngeal swab, presence of viral mutation(s) within the areas targeted by this assay, and inadequate number of viral copies(<138 copies/mL). A negative result must be combined with clinical observations, patient history, and epidemiological information. The expected result is Negative.  Fact Sheet for Patients:  EntrepreneurPulse.com.au  Fact Sheet for Healthcare Providers:  IncredibleEmployment.be  This test is no t yet approved or cleared by the [place] FDA and  has been authorized for detection and/or diagnosis of SARS-CoV-2 by FDA under an Emergency Use Authorization (EUA). This EUA will remain  in effect (meaning this test can be used) for the duration of the COVID-19 declaration under Section 564(b)(1) of the Act, 21 U.S.C.section 360bbb-3(b)(1), unless the authorization is terminated  or revoked sooner.       Influenza A by PCR NEGATIVE NEGATIVE Final   Influenza B by PCR NEGATIVE NEGATIVE Final    Comment: (NOTE) The Xpert Xpress SARS-CoV-2/FLU/RSV plus assay is intended as an aid in the diagnosis of influenza from Nasopharyngeal swab specimens and should not be used as a sole basis for treatment. Nasal washings and aspirates are unacceptable for Xpert Xpress SARS-CoV-2/FLU/RSV testing.  Fact Sheet for Patients: EntrepreneurPulse.com.au  Fact Sheet for Healthcare Providers: IncredibleEmployment.be  This test is not yet approved or cleared by the [place] FDA and has been authorized for detection and/or diagnosis of  SARS-CoV-2 by FDA under an Emergency Use Authorization (EUA). This EUA will remain in effect (meaning this test can be used) for the duration of the COVID-19 declaration under Section 564(b)(1) of the Act, 21 U.S.C. section 360bbb-3(b)(1), unless the authorization is terminated or revoked.  Performed at [facility], [address] [place], [place] [address]   Blood culture (routine single)     Status: None (Preliminary result)   Collection Time: 01/04/21  2:58 PM   Specimen: BLOOD  Result Value Ref Range Status   Specimen Description BLOOD LEFT ANTECUBITAL  Final   Special Requests   Final    BOTTLES DRAWN AEROBIC AND ANAEROBIC Blood Culture adequate volume   Culture  Setup Time   Final    YEAST ANAEROBIC BOTTLE ONLY Organism ID to follow CRITICAL RESULT CALLED TO, READ BACK BY AND VERIFIED WITH: Coralie Carpen 01/05/21 1507 SLM Performed at [facility], [address] [place], [place] [address]    Culture YEAST  Final   Report Status PENDING  Incomplete  Blood Culture ID Panel (Reflexed)     Status: Abnormal   Collection Time: 01/04/21  2:58 PM  Result Value Ref Range Status   Enterococcus faecalis NOT DETECTED NOT DETECTED Final   Enterococcus Faecium NOT DETECTED NOT DETECTED Final   Listeria monocytogenes NOT DETECTED NOT DETECTED Final   Staphylococcus species NOT DETECTED NOT DETECTED Final   Staphylococcus aureus (BCID) NOT DETECTED NOT DETECTED Final   Staphylococcus epidermidis NOT DETECTED NOT DETECTED Final   Staphylococcus lugdunensis NOT DETECTED NOT DETECTED Final   Streptococcus species NOT DETECTED NOT DETECTED Final   Streptococcus agalactiae NOT DETECTED NOT DETECTED Final   Streptococcus pneumoniae  NOT DETECTED NOT DETECTED Final   Streptococcus pyogenes NOT DETECTED NOT DETECTED Final   A.calcoaceticus-baumannii NOT DETECTED NOT DETECTED Final   Bacteroides fragilis NOT DETECTED NOT DETECTED Final   Enterobacterales NOT DETECTED  NOT DETECTED Final   Enterobacter cloacae complex NOT DETECTED NOT DETECTED Final   Escherichia coli NOT DETECTED NOT DETECTED Final   Klebsiella aerogenes NOT DETECTED NOT DETECTED Final   Klebsiella oxytoca NOT DETECTED NOT DETECTED Final   Klebsiella pneumoniae NOT DETECTED NOT DETECTED Final   Proteus species NOT DETECTED NOT DETECTED Final   Salmonella species NOT DETECTED NOT DETECTED Final   Serratia marcescens NOT DETECTED NOT DETECTED Final   Haemophilus influenzae NOT DETECTED NOT DETECTED Final   Neisseria meningitidis NOT DETECTED NOT DETECTED Final   Pseudomonas aeruginosa NOT DETECTED NOT DETECTED Final   Stenotrophomonas maltophilia NOT DETECTED NOT DETECTED Final   Candida albicans NOT DETECTED NOT DETECTED Final   Candida auris NOT DETECTED NOT DETECTED Final   Candida glabrata NOT DETECTED NOT DETECTED Final   Candida krusei DETECTED (A) NOT DETECTED Final    Comment: CRITICAL RESULT CALLED TO, READ BACK BY AND VERIFIED WITH: SAMANTHA RAUER 01/05/21 1507 SLM    Candida parapsilosis NOT DETECTED NOT DETECTED Final   Candida tropicalis NOT DETECTED NOT DETECTED Final   Cryptococcus neoformans/gattii NOT DETECTED NOT DETECTED Final    Comment: Performed at [facility], [address] [place], [place] [address]  MRSA Next Gen by PCR, Nasal     Status: None   Collection Time: 01/04/21  4:58 PM   Specimen: Nasal Mucosa; Nasal Swab  Result Value Ref Range Status   MRSA by PCR Next Gen NOT DETECTED NOT DETECTED Final    Comment: (NOTE) The GeneXpert MRSA Assay (FDA approved for NASAL specimens only), is one component of a comprehensive MRSA colonization surveillance program. It is not intended to diagnose MRSA infection nor to guide or monitor treatment for MRSA infections. Test performance is not FDA approved in patients less than 77 years old. Performed at [facility], [address], [place], [place] [address]       Radiology Studies: DG  Skull 1-3 Views  Result Date: 01/04/2021 CLINICAL DATA:  Evaluation for shunt EXAM: SKULL - 1-3 VIEW COMPARISON:  01/04/2021 CT head. FINDINGS: There is no evidence of skull fracture or other focal bone lesions. Right frontal approach ventriculostomy catheter, with shunt set at 110 mm H2O. No evidence of shunt discontinuity or kinking. IMPRESSION: Ventriculostomy catheter, with shunt set 110 mm of H2O, without evidence of shunt discontinuity or kinking. Electronically Signed   By: Merilyn Baba M.D.   On: 01/04/2021 18:47   CT HEAD WO CONTRAST (5MM)  Result Date: 01/04/2021 CLINICAL DATA:  Neck trauma, dangerous injury mechanism (Age 103-64y); Head trauma, focal neuro findings (Age 59-64y) EXAM: CT HEAD WITHOUT CONTRAST CT CERVICAL SPINE WITHOUT CONTRAST TECHNIQUE: Multidetector CT imaging of the head and cervical spine was performed following the standard protocol without intravenous contrast. Multiplanar CT image reconstructions of the cervical spine were also generated. COMPARISON:  Head CT 10/12/2019, cervical spine CT 11/10/2012. FINDINGS: CT HEAD FINDINGS Brain: There is a new hypodense right subdural collection, without visible acute blood products.There is adjacent loss of gray-white matter attenuation in the right parietal lobe, concerning for ischemia.There is a right frontal approach ventriculostomy catheter with tip unchanged in position in the midline between the frontal horns. Vascular: No hyperdense vessel or unexpected calcification. Skull: Right frontal burr hole. Prior suboccipital  craniotomy. No acute fracture. Sinuses/Orbits: Paranasal sinuses are predominantly clear. Other: None. CT CERVICAL SPINE FINDINGS Alignment: Unchanged Skull base and vertebrae: No acute fracture. Soft tissues and spinal canal: VP shunt catheter courses down the right neck. No prevertebral soft tissue swelling. No visible canal hematoma. Disc levels: Enlargement of the spinal canal throughout the cervical spine.  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 Unchanged tubing within the spinal canal in comparison to prior exam in 2014. Upper chest: Negative. Other: None. IMPRESSION: Head CT: New hypodense right subdural collection measuring 0.9 cm, without evidence of acute blood products. Underlying loss of gray-white matter differentiation in the right parietal lobe, concerning for acute-subacute infarct. Recommend MRI. Cervical spine CT: No acute cervical spine fracture. Unchanged chronic congenital and postoperative changes. Critical Value/emergent results were called by telephone at the time of interpretation on 01/04/2021 at 2:43 pm to provider [facility] , who verbally acknowledged these results. Electronically Signed   By: Maurine Simmering M.D.   On: 01/04/2021 14:48   CT Cervical Spine Wo Contrast  Result Date: 01/04/2021 CLINICAL DATA:  Neck trauma, dangerous injury mechanism (Age 3-64y); Head trauma, focal neuro findings (Age 66-64y) EXAM: CT HEAD WITHOUT CONTRAST CT CERVICAL SPINE WITHOUT CONTRAST TECHNIQUE: Multidetector CT imaging of the head and cervical spine was performed following the standard protocol without intravenous contrast. Multiplanar CT image reconstructions of the cervical spine were also generated. COMPARISON:  Head CT 10/12/2019, cervical spine CT 11/10/2012. FINDINGS: CT HEAD FINDINGS Brain: There is a new hypodense right subdural collection, without visible acute blood products.There is adjacent loss of gray-white matter attenuation in the right parietal lobe, concerning for ischemia.There is a right frontal approach ventriculostomy catheter with tip unchanged in position in the midline between the frontal horns. Vascular: No hyperdense vessel or unexpected calcification. Skull: Right frontal burr hole. Prior suboccipital craniotomy. No acute fracture. Sinuses/Orbits: Paranasal sinuses are predominantly clear. Other: None. CT CERVICAL SPINE FINDINGS Alignment: Unchanged  Skull base and vertebrae: No acute fracture. Soft tissues and spinal canal: VP shunt catheter courses down the right neck. No prevertebral soft tissue swelling. No visible canal hematoma. Disc levels: Enlargement of the spinal canal throughout the cervical spine. 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 Unchanged tubing within the spinal canal in comparison to prior exam in 2014. Upper chest: Negative. Other: None. IMPRESSION: Head CT: New hypodense right subdural collection measuring 0.9 cm, without evidence of acute blood products. Underlying loss of gray-white matter differentiation in the right parietal lobe, concerning for acute-subacute infarct. Recommend MRI. Cervical spine CT: No acute cervical spine fracture. Unchanged chronic congenital and postoperative changes. Critical Value/emergent results were called by telephone at the time of interpretation on 01/04/2021 at 2:43 pm to provider [facility] , who verbally acknowledged these results. Electronically Signed   By: Maurine Simmering M.D.   On: 01/04/2021 14:48   CT CHEST ABDOMEN PELVIS W CONTRAST  Result Date: 01/04/2021 CLINICAL DATA:  recurrent falls, VP shunt present EXAM: CT CHEST, ABDOMEN, AND PELVIS WITH CONTRAST TECHNIQUE: Multidetector CT imaging of the chest, abdomen and pelvis was performed following the standard protocol during bolus administration of intravenous contrast. CONTRAST:  16m OMNIPAQUE IOHEXOL 350 MG/ML SOLN COMPARISON:  None. FINDINGS: CT CHEST FINDINGS Cardiovascular: Normal cardiac size.No pericardial disease.Normal size main and branch pulmonary arteries.The thoracic aorta is unremarkable. Mediastinum/Nodes: No lymphadenopathy.The thyroid is unremarkable.Esophagus is unremarkable.The trachea is unremarkable. Lungs/Pleura: No focal airspace consolidation.No suspicious pulmonary nodules or masses.No pleural effusion.No pneumothorax.  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 Musculoskeletal: No  acute osseous abnormality.No suspicious lytic or blastic lesions. Congenital enlarged spinal canal. CT ABDOMEN PELVIS FINDINGS Hepatobiliary: No hepatic injury or perihepatic hematoma. No gallstones, gallbladder wall thickening, or biliary dilatation. Pancreas: Unremarkable. No pancreatic ductal dilatation or surrounding inflammatory changes. Spleen: Focal peripheral somewhat wedge-shaped hypodensity in the upper posterior aspect of the spleen, morphology favoring small cyst, hemangioma, or possibly infarct (series 2, image 47) rather than laceration. Adrenals/Urinary Tract: No adrenal hemorrhage or renal injury identified. Bladder is unremarkable. No hydronephrosis or nephrolithiasis. No renal laceration. Subcentimeter hypodense right renal lesion, likely cyst. The bladder is minimally distended but unremarkable. Stomach/Bowel: The stomach is within normal limits. There is no evidence of bowel obstruction. No evidence of appendicitis. Vascular/Lymphatic: Diffuse atherosclerotic calcified and noncalcified plaque throughout the abdominal aorta, which appears diminutive distally. Reproductive: Unremarkable. Other: No abdominal wall hernia or abnormality. No abdominopelvic ascites. VP shunt tubing terminates in the pelvis. Musculoskeletal: No acute osseous abnormality. Mild bilateral hip osteoarthritis. Diffuse muscle atrophy. Congenital enlargement of the spinal canal. IMPRESSION: No evidence of acute trauma in the chest. Focal peripheral hypodensity in the upper posterior aspect of the spleen, morphology favoring small cyst, hemangioma, or possibly splenic infarct, and felt unlikely to be acute trauma. No other acute findings in the abdomen or pelvis. Electronically Signed   By: Maurine Simmering M.D.   On: 01/04/2021 14:56   CT T-SPINE NO CHARGE  Result Date: 01/04/2021 CLINICAL DATA:  Fall EXAM: CT THORACIC SPINE WITHOUT CONTRAST TECHNIQUE: Multidetector CT images of the thoracic were obtained using the standard  protocol without intravenous contrast. COMPARISON:  None. FINDINGS: Alignment: Normal. Vertebrae: No acute fracture or focal pathologic process. Paraspinal and other soft tissues: Reported separately on chest CT. Disc levels: Preserved disc heights. Chronic enlargement of the spinal canal. IMPRESSION: No acute thoracic spine fracture. Electronically Signed   By: Maurine Simmering M.D.   On: 01/04/2021 14:18   CT L-SPINE NO CHARGE  Result Date: 01/04/2021 CLINICAL DATA:  Fall EXAM: CT LUMBAR SPINE WITHOUT CONTRAST TECHNIQUE: Multidetector CT imaging of the lumbar spine was performed without intravenous contrast administration. Multiplanar CT image reconstructions were also generated. COMPARISON:  None. FINDINGS: Segmentation: 5 lumbar type vertebrae. Alignment: Normal Vertebrae: No acute fracture or focal pathologic process. Paraspinal and other soft tissues: Reported separately on CT abdomen and pelvis Disc levels: No visible impingement. IMPRESSION: No acute lumbar spine fracture. Electronically Signed   By: Maurine Simmering M.D.   On: 01/04/2021 14:13   DG Chest Portable 1 View  Result Date: 01/04/2021 CLINICAL DATA:  Weakness, falls EXAM: PORTABLE CHEST 1 VIEW COMPARISON:  Chest radiograph 02/11/2013 FINDINGS: The patient tubing is seen over the right hemithorax. A focus of calcification along the right aspect of the shunt projecting over the right apex is unchanged. The cardiomediastinal silhouette is normal. There is no focal consolidation or pulmonary edema. There is no pleural effusion or pneumothorax. Slight levocurvature of the upper thoracic spine is noted. There is no acute osseous abnormality. IMPRESSION: No radiographic evidence of acute cardiopulmonary process. Electronically Signed   By: Valetta Mole M.D.   On: 01/04/2021 12:51   ECHOCARDIOGRAM COMPLETE  Result Date: 01/05/2021    ECHOCARDIOGRAM REPORT   Patient Name:   KAMIRYN BEZANSON Date of Exam: 01/05/2021 Medical Rec #:  [id number]         Height:       60.0 in Accession #:    [id number]  Weight:       106.9 lb Date of Birth:  [birth year]       BSA:          1.430 m Patient Age:    39 years         BP:           157/111 mmHg Patient Gender: F                HR:           97 bpm. Exam Location:  [facility] Procedure: 2D Echo, Color Doppler and Cardiac Doppler Indications:     I63.9 Stroke  History:         Patient has no prior history of Echocardiogram examinations.                  Risk Factors:Hypertension.  Sonographer:     Charmayne Sheer Referring Phys:  [id number] MCNEILL P KIRKPATRICK Diagnosing Phys: Nelva Bush MD  Sonographer Comments: Suboptimal subcostal window. IMPRESSIONS  1. Left ventricular ejection fraction, by estimation, is 55 to 60%. The left ventricle has normal function. The left ventricle has no regional wall motion abnormalities. Left ventricular diastolic parameters are consistent with Grade I diastolic dysfunction (impaired relaxation). Elevated left atrial pressure.  2. Right ventricular systolic function is normal. The right ventricular size is normal.  3. The mitral valve is abnormal. Cannot exclude prolapse of posterior leaflet or cleft. There is at least mild to moderate mitral valve regurgitation. No evidence of mitral stenosis.  4. The aortic valve is tricuspid. Aortic valve regurgitation is not visualized. No aortic stenosis is present.  5. The inferior vena cava is normal in size with <50% respiratory variability, suggesting right atrial pressure of 8 mmHg. FINDINGS  Left Ventricle: Left ventricular ejection fraction, by estimation, is 55 to 60%. The left ventricle has normal function. The left ventricle has no regional wall motion abnormalities. The left ventricular internal cavity size was normal in size. There is  no left ventricular hypertrophy. Left ventricular diastolic parameters are consistent with Grade I diastolic dysfunction (impaired relaxation). Elevated left atrial pressure. Right Ventricle: The right  ventricular size is normal. No increase in right ventricular wall thickness. Right ventricular systolic function is normal. Left Atrium: Left atrial size was normal in size. Right Atrium: Right atrial size was normal in size. Pericardium: There is no evidence of pericardial effusion. Presence of pericardial fat pad. Mitral Valve: The mitral valve is abnormal. Mild to moderate mitral valve regurgitation. No evidence of mitral valve stenosis. MV peak gradient, 6.0 mmHg. The mean mitral valve gradient is 3.0 mmHg. Tricuspid Valve: The tricuspid valve is not well visualized. Tricuspid valve regurgitation is trivial. Aortic Valve: The aortic valve is tricuspid. Aortic valve regurgitation is not visualized. No aortic stenosis is present. Aortic valve mean gradient measures 6.0 mmHg. Aortic valve peak gradient measures 11.3 mmHg. Aortic valve area, by VTI measures 2.04  cm. Pulmonic Valve: The pulmonic valve was not well visualized. Pulmonic valve regurgitation is trivial. No evidence of pulmonic stenosis. Aorta: The aortic root and ascending aorta are structurally normal, with no evidence of dilitation. Pulmonary Artery: The pulmonary artery is not well seen. Venous: The inferior vena cava is normal in size with less than 50% respiratory variability, suggesting right atrial pressure of 8 mmHg. IAS/Shunts: The interatrial septum was not well visualized.  LEFT VENTRICLE PLAX 2D LVIDd:         4.40 cm   Diastology LVIDs:  3.16 cm   LV e' medial:    6.31 cm/s LV PW:         0.85 cm   LV E/e' medial:  16.5 LV IVS:        0.66 cm   LV e' lateral:   7.83 cm/s LVOT diam:     1.80 cm   LV E/e' lateral: 13.3 LV SV:         63 LV SV Index:   44 LVOT Area:     2.54 cm  RIGHT VENTRICLE RV Basal diam:  2.72 cm RV S prime:     15.60 cm/s LEFT ATRIUM             Index        RIGHT ATRIUM          Index LA diam:        2.80 cm 1.96 cm/m   RA Area:     8.05 cm LA Vol (A2C):   26.3 ml 18.39 ml/m  RA Volume:   16.00 ml 11.19  ml/m LA Vol (A4C):   32.9 ml 23.00 ml/m LA Biplane Vol: 29.9 ml 20.90 ml/m  AORTIC VALVE                     PULMONIC VALVE AV Area (Vmax):    1.95 cm      PV Vmax:       1.16 m/s AV Area (Vmean):   1.94 cm      PV Vmean:      74.500 cm/s AV Area (VTI):     2.04 cm      PV VTI:        0.167 m AV Vmax:           168.00 cm/s   PV Peak grad:  5.4 mmHg AV Vmean:          119.000 cm/s  PV Mean grad:  2.0 mmHg AV VTI:            0.309 m AV Peak Grad:      11.3 mmHg AV Mean Grad:      6.0 mmHg LVOT Vmax:         129.00 cm/s LVOT Vmean:        90.500 cm/s LVOT VTI:          0.248 m LVOT/AV VTI ratio: 0.80  AORTA Ao Root diam: 2.60 cm MITRAL VALVE MV Area (PHT): 6.96 cm     SHUNTS MV Area VTI:   2.99 cm     Systemic VTI:  0.25 m MV Peak grad:  6.0 mmHg     Systemic Diam: 1.80 cm MV Mean grad:  3.0 mmHg MV Vmax:       1.22 m/s MV Vmean:      79.0 cm/s MV Decel Time: 109 msec MV E velocity: 104.00 cm/s MV A velocity: 124.00 cm/s MV E/A ratio:  0.84 Christopher End MD Electronically signed by Nelva Bush MD Signature Date/Time: 01/05/2021/10:54:03 AM    Final     Scheduled Meds:  budesonide (PULMICORT) nebulizer solution  0.5 mg Nebulization BID   Chlorhexidine Gluconate Cloth  6 each Topical Q0600   methylPREDNISolone (SOLU-MEDROL) injection  20 mg Intravenous Q12H   [START ON 01/08/2021] pantoprazole  40 mg Intravenous Q12H   Continuous Infusions:  ampicillin-sulbactam (UNASYN) IV 3 g (01/05/21 1501)   [START ON 01/06/2021] anidulafungin     anidulafungin     pantoprazole 8 mg/hr (01/05/21  0238)     LOS: 1 day   Time spent: 45 minutes. More than 50% of the time was spent in counseling/coordination of care  Lorella Nimrod, MD [facility]  If 7PM-7AM, please contact night-coverage [email or web address]  01/05/2021, 3:41 PM   This record has been created using Systems analyst. Errors have been sought and corrected,but may not always be located. Such creation errors do not  reflect on the standard of care.

## 2021-01-05 NOTE — Progress Notes (Signed)
*  PRELIMINARY RESULTS* Echocardiogram 2D Echocardiogram has been performed.  Carolyn Herring 01/05/2021, 9:52 AM 

## 2021-01-05 NOTE — Anesthesia Preprocedure Evaluation (Signed)
Anesthesia Evaluation  Patient identified by MRN, date of birth, ID band Patient awake    Reviewed: Allergy & Precautions, NPO status , Patient's Chart, lab work & pertinent test results  Airway Mallampati: II  TM Distance: >3 FB Neck ROM: Full    Dental  (+) Edentulous Upper, Edentulous Lower   Pulmonary neg pulmonary ROS, Current Smoker,    Pulmonary exam normal        Cardiovascular hypertension, Normal cardiovascular exam+ Valvular Problems/Murmurs MR      Neuro/Psych negative neurological ROS  negative psych ROS   GI/Hepatic negative GI ROS, Neg liver ROS,   Endo/Other  negative endocrine ROS  Renal/GU negative Renal ROS  negative genitourinary   Musculoskeletal negative musculoskeletal ROS (+)   Abdominal   Peds negative pediatric ROS (+)  Hematology negative hematology ROS (+)   Anesthesia Other Findings HTN Hydrocephalus 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 last revised in 2019  Reproductive/Obstetrics negative OB ROS                            Anesthesia Physical Anesthesia Plan  ASA: 3  Anesthesia Plan: General   Post-op Pain Management:    Induction: Intravenous  PONV Risk Score and Plan: 2 and TIVA and Propofol infusion  Airway Management Planned: Natural Airway and Nasal Cannula  Additional Equipment:   Intra-op Plan:   Post-operative Plan:   Informed Consent: I have reviewed the patients History and Physical, chart, labs and discussed the procedure including the risks, benefits and alternatives for the proposed anesthesia with the patient or authorized representative who has indicated his/her understanding and acceptance.       Plan Discussed with: CRNA, Anesthesiologist and Surgeon  Anesthesia Plan Comments:         Anesthesia Quick Evaluation

## 2021-01-05 NOTE — Evaluation (Signed)
Physical Therapy Evaluation Patient Details Name: Carolyn Herring MRN: [id number] DOB: 1976-02-23 Today's Date: 01/05/2021  History of Present Illness  Pt is a 45 y.o. F arriving to ED after a fall with decreased responsiveness and hx of abdominal pain and diarrhea. Upon arrival pt was found to have severe GI bleed. 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  Clinical Impression  Pt alert, oriented to name, DOB, and situation with disorientation for time (stated 2021 as year). Pt also acknowledges lapse in memory when providing details on history and PLOF. Pt does state having a significant fall history but unable to articulate outcomes of falls although aware of changes in vision occur prior to each fall (stated as syncopal per patient). Per pt, she lives alone and does not utilize an AD at baseline for OOB mobility.  Pt's blood pressure elevated at beginning of session 153/106, increased to 160/110 with mobility, asymptomatic overall. Assessment limited secondary to vitals. Pt required MIN A for bed mobility and MIN-G for transfers & ambulation. No LOB noted but pt does demonstrate decreased strength, endurance, and balance required for household distances currently. Due to significant history of falls, decreased strength, and change from PLOF SNF recommended w/ supervision for OOB. Skilled PT intervention is indicated to address deficits in function, mobility, and to return to PLOF as able.        Recommendations for follow up therapy are one component of a multi-disciplinary discharge planning process, led by the attending physician.  Recommendations may be updated based on patient status, additional functional criteria and insurance authorization.  Follow Up Recommendations SNF;Supervision for mobility/OOB    Equipment Recommendations  Other (comment) (TBD next venue of care)     Recommendations for Other Services       Precautions / Restrictions Precautions Precautions: Fall Restrictions Weight Bearing Restrictions: No      Mobility  Bed Mobility Overal bed mobility: Needs Assistance Bed Mobility: Supine to Sit     Supine to sit: Min assist;HOB elevated     General bed mobility comments: Slight assist trunk support, pt prefers to utilize bed rails but looping wrist through to pull toward EOB    Transfers Overall transfer level: Needs assistance Equipment used: Rolling walker (2 wheeled) Transfers: Sit to/from Stand Sit to Stand: Min guard;From elevated surface         General transfer comment: no LOB or physical assist needed upon standing  Ambulation/Gait Ambulation/Gait assistance: Min guard Gait Distance (Feet): 5 Feet Assistive device: None Gait Pattern/deviations: Step-to pattern;Decreased step length - right;Decreased step length - left;Decreased dorsiflexion - left;Decreased dorsiflexion - right     General Gait Details: Pt able to step forward, backward without LOB or dizziness. BP elevated with movement 116/110 so deferred further amb.  Stairs            Wheelchair Mobility    Modified Rankin (Stroke Patients Only)       Balance Overall balance assessment: Needs assistance Sitting-balance support: Bilateral upper extremity supported;Feet unsupported Sitting balance-Leahy Scale: Poor Sitting balance - Comments: requires at least single UE support for balance   Standing balance support: No upper extremity supported Standing balance-Leahy Scale: Fair Standing balance comment: Min-G for standing and stepping forward, could not tolerate moderate challenge  Pertinent Vitals/Pain Pain Assessment: Faces Faces Pain Scale: Hurts even more Pain Location: ribcage R >L Pain Descriptors / Indicators: Aching;Sore Pain Intervention(s): Limited activity within patient's  tolerance;Monitored during session;Repositioned    Home Living Family/patient expects to be discharged to:: Private residence Living Arrangements: Alone Available Help at Discharge: Family;Available 24 hours/day Type of Home: House Home Access: Level entry     Home Layout: One level Home Equipment: None Additional Comments: Aunt resides next door and checks on patient frequently    Prior Function Level of Independence: Needs assistance   Gait / Transfers Assistance Needed: independent without AD usage for house house hold distances; pt states having significant fall history  ADL's / Homemaking Assistance Needed: does not drive, insurance provides opportunities for transportation; unable to remember if assistance is needed for bathing or grooming but loosely indicates assistance at one point  Comments: Decreased short term memory, but pt notes confidence when providing details regarding PLOF on amb and living environment     Hand Dominance   Dominant Hand: Right    Extremity/Trunk Assessment   Upper Extremity Assessment Upper Extremity Assessment: Generalized weakness (Minimal ability to ext digits or create full fist; SILT BUE)    Lower Extremity Assessment Lower Extremity Assessment: Generalized weakness (Decreased DF AROM, PROM to neutral; Dimished sensation to light touch R > L plantar aspect foot;)       Communication   Communication: No difficulties  Cognition Arousal/Alertness: Awake/alert Behavior During Therapy: WFL for tasks assessed/performed Overall Cognitive Status: No family/caregiver present to determine baseline cognitive functioning                                 General Comments: Oriented name, DOB, situation; disoriented to year, place; follows commands consistently but demonstrates decreased STM which pt is aware of and acknowledges (i.e., can't remember if she requies assist for ADLs)      General Comments      Exercises      Assessment/Plan    PT Assessment Patient needs continued PT services  PT Problem List Decreased strength;Decreased range of motion;Decreased activity tolerance;Decreased balance;Decreased mobility;Decreased coordination       PT Treatment Interventions Balance training;Gait training;Neuromuscular re-education;Stair training;Functional mobility training;Therapeutic activities;Therapeutic exercise    PT Goals (Current goals can be found in the Care Plan section)  Acute Rehab PT Goals Patient Stated Goal: To feel better PT Goal Formulation: With patient Time For Goal Achievement: 01/19/21 Potential to Achieve Goals: Good    Frequency Min 2X/week   Barriers to discharge        Co-evaluation               AM-PAC PT "6 Clicks" Mobility  Outcome Measure Help needed turning from your back to your side while in a flat bed without using bedrails?: None Help needed moving from lying on your back to sitting on the side of a flat bed without using bedrails?: A Little Help needed moving to and from a bed to a chair (including a wheelchair)?: A Little Help needed standing up from a chair using your arms (e.g., wheelchair or bedside chair)?: A Little Help needed to walk in hospital room?: A Little Help needed climbing 3-5 steps with a railing? : A Lot 6 Click Score: 18    End of Session Equipment Utilized During Treatment: Gait belt Activity Tolerance: Patient tolerated treatment well Patient left: in chair;with call bell/phone within reach;with chair alarm  set Nurse Communication: Mobility status PT Visit Diagnosis: Other abnormalities of gait and mobility (R26.89);Repeated falls (R29.6);History of falling (Z91.81);Muscle weakness (generalized) (M62.81)    Time: 2330-0762 PT Time Calculation (min) (ACUTE ONLY): 42 min   Charges:             [employer], SPT

## 2021-01-05 NOTE — Anesthesia Procedure Notes (Signed)
Date/Time: 01/05/2021 1:37 PM Performed by: Tonia Ghent Pre-anesthesia Checklist: Patient identified, Emergency Drugs available, Suction available, Patient being monitored and Timeout performed Patient Re-evaluated:Patient Re-evaluated prior to induction Oxygen Delivery Method: Nasal cannula Preoxygenation: Pre-oxygenation with 100% oxygen Induction Type: IV induction Airway Equipment and Method: Bite block Placement Confirmation: positive ETCO2 and CO2 detector

## 2021-01-05 NOTE — Progress Notes (Signed)
Subjective: No acute events overnight, no further episodes of vision loss.  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  Imaging reveals subdural fluid collection with hypodensity in the right parietal region.  Possibilities include embolic stroke, healing contusion, watershed infarct due to hypotension/anemia.  MRI would be invaluable, but with her shunt, we will need to ensure that it could be reprogrammed.   Recommendations: 1) carotid Dopplers 2) MRI brain, MRA head and neck possible 3) avoid severe hypotension/anemia 4) echocardiogram 5) neurology will follow  Ritta Slot, MD [facility] [phone]  If 7pm- 7am, please page neurology on call as listed in AMION.

## 2021-01-05 NOTE — Care Plan (Signed)
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 At least 3 cm in size but likely bigger. One area of adherent clot that was lavaged but clot did not dislodge. Forrest classification IIB . Given size and adherent clot recommend IV PPI gtt or BID for at least total of 3 days before considering discharge. She will need to be on oral PPI BID at home but states she has trouble taking pills so will need to be on a PPI that's in capsule form that she can break and take with apple sauce or liquid.  - clears today - IV PPI BID - no NSAIDS - close monitoring  Merlyn Lot MD, MPH [facility]

## 2021-01-05 NOTE — Progress Notes (Addendum)
PROGRESS NOTE    Carolyn Herring  UDJ:[id number] DOB: 04/13/1975 DOA: 01/04/2021 PCP: Patient, No Pcp Per (Inactive)   Brief Narrative: Taken from prior notes. 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  She has not seen a neurosurgeon in 3 yrs.  She presented to [facility] ER on 10/10 via EMS from home with recurrent falls, generalized weakness, and back pain.  She is unsure if she has been hitting her head during the falls.  She also endorsed several week hx of abdominal pain and diarrhea.  She was diagnosed with COVID-19 in 08/2020, and has had worsening generalized weakness since the dx.  She has not seen a PCP for evaluation of symptoms.  She does live alone, but her aunt lives next door.  Of note pt able to notify EMS for assistance, however when they arrived at her home she was unable to answer the door.  Therefore,  EMS had to break her door down to gain entry.    On EMS arrival patient was hypotensive with systolic in 58I, heart rate 57.  Blood pressure improved with 2 L of LR bolus.  Lactic acidosis at 3.6, procalcitonin at 24.93, WBCs of 30.1, hemoglobin of 2.7, hematocrit of 10.4 and stool occult positive.  Patient was using Goody powder and ibuprofen frequently for abdominal pain.  Massive blood transfusion protocol activated on arrival to ED but patient only received 2 units of PRBC and 1 unit of FFP.  Hemoglobin improved to 11.8, doubtful that original reading was Real, might be some lab error.  Last hemoglobin in the chart was 14.4 in July 2021. She was started on Zosyn empirically for a possible intra-abdominal infection.  CT abdomen and pelvis was without any acute significant abnormality.  There was a focal peripheral hypodensity in the upper posterior aspect of the spleen, possibly a small cyst, hemangioma or a possible small splenic infarct.  No other abnormality found. Blood cultures  negative in 24 hours, COVID-19 and influenza PCR negative, MRSA swab negative. Chest x-ray without any acute abnormality.  CT head and cervical spine with new hyperdense right subdural collection, without evidence of acute blood products.  There was an underlying loss of gray-white matter differentiation in the right parietal lobe concerning for acute-subacute infarct.  No new focal deficit.  Neurology was consulted and they are recommending MRI and MRA if her shunt device is compatible. GI was also consulted and she is going for EGD today. Patient initially admitted to ICU, TRH to resume care on 01/05/2021  10/11 : patient appears to be at baseline.  Patient denies any obvious bleeding, hematemesis, melena or hematochezia.  She was having some diarrhea for some time.  She is pretty much independent for ADLs and walk with the help of walker.  Lives alone with family keep checking on her.  They are not lives next door.  She denies any fever or chills.  She denies any menorrhagia, stating that her periods are becoming more scant, she thinks she is approaching menopause. Going for EGD later today.  Subjective: Patient was seen and examined today.  She thinks that she is close to her baseline, continue to feel little weak.  Did not had any more diarrhea, she denies any obvious bleeding which includes hematemesis, melena or hematochezia.  Her menstrual cycle is becoming scant, she thinks that she is approaching menopause.  She denies any menorrhagia.  Assessment & Plan:   Active Problems:   Hemorrhagic shock (HCC)  Septic/hemorrhagic shock/GI bleed.  Quite confusing picture, patient only received 2 unit of PRBC and 1 unit of FFP with unusually significant improvement in her hemoglobin to 11.8, I really thinks that initial reading was a lab error.  Going for EGD today, history of significant NSAID use. She also met sepsis criteria with marked leukocytosis, tachycardia on presenting to ED, and mild  lactic acidosis which is improving.  Procalcitonin markedly elevated.  No obvious source of infection yet, UA with leukocytosis, history of abdominal pain and diarrhea for some time, CT abdomen without any significant abnormality.  Blood cultures pending, added urine culture. She initially received Zosyn which was transitioned to Unasyn.  Procalcitonin, leukocytosis and lactic acidosis trending down. -Continue with Unasyn -Follow-up EGD findings -Follow-up blood and urine cultures. -Monitor hemoglobin and hold off to more transfusions at this time. -Monitor procalcitonin and CBC -Can do GI panel if more diarrhea.  Generalized weakness and falls.  Since prior COVID infection in June 2022. -PT/OT evaluation  Abnormal CT head.  There was some concern of new hyperdense right subdural collection, without evidence of acute blood products and a concern of acute-subacute infarct in the right parietal lobe. Neurology was consulted.  No new focal deficit.  Patient has baseline less strength on left. Neurology is recommending MRI and MRAs for further evaluation if her shunt is compatible. Patient has an history of hydrocephalus with spina bifid s/p VP shunt with cervical teetered cord release in 2011, last revised in 2019. Currently appears at baseline.  Objective: Vitals:   01/05/21 0801 01/05/21 0817 01/05/21 0841 01/05/21 1319  BP:    (!) 154/107  Pulse:   100 94  Resp:   20 20  Temp:   98.4 F (36.9 C)   TempSrc:   Oral   SpO2: 100% 99% 96% 99%  Weight:      Height:        Intake/Output Summary (Last 24 hours) at 01/05/2021 1333 Last data filed at 01/05/2021 0700 Gross per 24 hour  Intake 2508.36 ml  Output 750 ml  Net 1758.36 ml   Filed Weights   01/04/21 1140 01/04/21 1657 01/05/21 0426  Weight: 53.5 kg 49.2 kg 48.5 kg    Examination:  General exam: Frail tiny lady, appears calm and comfortable  Respiratory system: Clear to auscultation. Respiratory effort  normal. Cardiovascular system: S1 & S2 heard, RRR.  Gastrointestinal system: Soft, nontender, nondistended, bowel sounds positive. Central nervous system: Alert and oriented. No focal neurological deficits. Extremities: No edema, no cyanosis, pulses intact and symmetrical. Psychiatry: Judgement and insight appear normal.   DVT prophylaxis: SCDs, concern of GI bleed Code Status: Full Family Communication:  Disposition Plan:  Status is: Inpatient  Remains inpatient appropriate because:Inpatient level of care appropriate due to severity of illness  Dispo: The patient is from: Home              Anticipated d/c is to: Home              Patient currently is not medically stable to d/c.   Difficult to place patient No               Level of care: [facility]  All the records are reviewed and case discussed with Care Management/Social Worker. Management plans discussed with the patient, nursing and they are in agreement.  Consultants:  GI PCCM  Procedures:  Antimicrobials:  Unasyn  Data Reviewed:  I have personally reviewed following labs and imaging studies  CBC: Recent Labs  Lab 01/04/21 1205 01/04/21 1458 01/04/21 1547 01/04/21 1915 01/05/21 0132 01/05/21 0540  WBC 30.2*  --   --   --   --  24.2*  NEUTROABS 27.0*  --   --   --   --   --   HGB 2.7* 11.1*  --  11.6* 11.8* 11.8*  HCT 10.4* 32.1*  --  33.2* 33.7* 34.7*  MCV 65.0*  --   --   --   --  75.4*  PLT 721*  --  372 406*  --  846   Basic Metabolic Panel: Recent Labs  Lab 01/04/21 1205 01/05/21 0540  NA 135 138  K 3.4* 4.1  CL 103 107  CO2 23 21*  GLUCOSE 144* 122*  BUN 36* 21*  CREATININE 0.86 0.75  CALCIUM 8.0* 8.1*  MG  --  2.0  PHOS  --  2.9   GFR: Estimated Creatinine Clearance: 64.5 mL/min (by C-G formula based on SCr of 0.75 mg/dL). Liver Function Tests: Recent Labs  Lab 01/04/21 1205  AST 20  ALT 13  ALKPHOS 82  BILITOT 0.6  PROT 5.5*  ALBUMIN 2.1*   Recent Labs  Lab 01/04/21 1205   LIPASE 50   No results for input(s): AMMONIA in the last 168 hours. Coagulation Profile: Recent Labs  Lab 01/04/21 1205 01/04/21 1547 01/04/21 1915 01/05/21 0540  INR 1.2 1.2 1.1 1.1   Cardiac Enzymes: Recent Labs  Lab 01/04/21 1205  CKTOTAL 85   BNP (last 3 results) No results for input(s): PROBNP in the last 8760 hours. HbA1C: No results for input(s): HGBA1C in the last 72 hours. CBG: Recent Labs  Lab 01/04/21 1704  GLUCAP 100*   Lipid Profile: Recent Labs    01/05/21 0540  CHOL 105  HDL 15*  LDLCALC 45  TRIG 223*  CHOLHDL 7.0   Thyroid Function Tests: No results for input(s): TSH, T4TOTAL, FREET4, T3FREE, THYROIDAB in the last 72 hours. Anemia Panel: Recent Labs    01/04/21 1458 01/04/21 1547 01/04/21 1915  VITAMINB12  --   --  364  FOLATE  --  21.5  --   FERRITIN  --  21  --   TIBC  --  255  --   IRON  --  53  --   RETICCTPCT 1.8  --   --    Sepsis Labs: Recent Labs  Lab 01/04/21 1205 01/04/21 1547 01/05/21 0538 01/05/21 0540  PROCALCITON 24.93  --  10.66  --   LATICACIDVEN 3.6* 2.5*  --  2.1*    Recent Results (from the past 240 hour(s))  Resp Panel by RT-PCR (Flu A&B, Covid) Nasopharyngeal Swab     Status: None   Collection Time: 01/04/21 12:05 PM   Specimen: Nasopharyngeal Swab; Nasopharyngeal(NP) swabs in vial transport medium  Result Value Ref Range Status   SARS Coronavirus 2 by RT PCR NEGATIVE NEGATIVE Final    Comment: (NOTE) SARS-CoV-2 target nucleic acids are NOT DETECTED.  The SARS-CoV-2 RNA is generally detectable in upper respiratory specimens during the acute phase of infection. The lowest concentration of SARS-CoV-2 viral copies this assay can detect is 138 copies/mL. A negative result does not preclude SARS-Cov-2 infection and should not be used as the sole basis for treatment or other patient management decisions. A negative result may occur with  improper specimen collection/handling, submission of specimen  other than nasopharyngeal swab, presence of viral mutation(s)  within the areas targeted by this assay, and inadequate number of viral copies(<138 copies/mL). A negative result must be combined with clinical observations, patient history, and epidemiological information. The expected result is Negative.  Fact Sheet for Patients:  EntrepreneurPulse.com.au  Fact Sheet for Healthcare Providers:  IncredibleEmployment.be  This test is no t yet approved or cleared by the [place] FDA and  has been authorized for detection and/or diagnosis of SARS-CoV-2 by FDA under an Emergency Use Authorization (EUA). This EUA will remain  in effect (meaning this test can be used) for the duration of the COVID-19 declaration under Section 564(b)(1) of the Act, 21 U.S.C.section 360bbb-3(b)(1), unless the authorization is terminated  or revoked sooner.       Influenza A by PCR NEGATIVE NEGATIVE Final   Influenza B by PCR NEGATIVE NEGATIVE Final    Comment: (NOTE) The Xpert Xpress SARS-CoV-2/FLU/RSV plus assay is intended as an aid in the diagnosis of influenza from Nasopharyngeal swab specimens and should not be used as a sole basis for treatment. Nasal washings and aspirates are unacceptable for Xpert Xpress SARS-CoV-2/FLU/RSV testing.  Fact Sheet for Patients: EntrepreneurPulse.com.au  Fact Sheet for Healthcare Providers: IncredibleEmployment.be  This test is not yet approved or cleared by the [place] FDA and has been authorized for detection and/or diagnosis of SARS-CoV-2 by FDA under an Emergency Use Authorization (EUA). This EUA will remain in effect (meaning this test can be used) for the duration of the COVID-19 declaration under Section 564(b)(1) of the Act, 21 U.S.C. section 360bbb-3(b)(1), unless the authorization is terminated or revoked.  Performed at [facility], [address]  [place], [place] [address]   Blood culture (routine single)     Status: None (Preliminary result)   Collection Time: 01/04/21  2:58 PM   Specimen: BLOOD  Result Value Ref Range Status   Specimen Description BLOOD LEFT ANTECUBITAL  Final   Special Requests   Final    BOTTLES DRAWN AEROBIC AND ANAEROBIC Blood Culture adequate volume   Culture   Final    NO GROWTH < 24 HOURS Performed at [facility], [address] [place], [place] [address]    Report Status PENDING  Incomplete  MRSA Next Gen by PCR, Nasal     Status: None   Collection Time: 01/04/21  4:58 PM   Specimen: Nasal Mucosa; Nasal Swab  Result Value Ref Range Status   MRSA by PCR Next Gen NOT DETECTED NOT DETECTED Final    Comment: (NOTE) The GeneXpert MRSA Assay (FDA approved for NASAL specimens only), is one component of a comprehensive MRSA colonization surveillance program. It is not intended to diagnose MRSA infection nor to guide or monitor treatment for MRSA infections. Test performance is not FDA approved in patients less than 2 years old. Performed at [facility], [address] [place], [place] [address]      Radiology Studies: DG Skull 1-3 Views  Result Date: 01/04/2021 CLINICAL DATA:  Evaluation for shunt EXAM: SKULL - 1-3 VIEW COMPARISON:  01/04/2021 CT head. FINDINGS: There is no evidence of skull fracture or other focal bone lesions. Right frontal approach ventriculostomy catheter, with shunt set at 110 mm H2O. No evidence of shunt discontinuity or kinking. IMPRESSION: Ventriculostomy catheter, with shunt set 110 mm of H2O, without evidence of shunt discontinuity or kinking. Electronically Signed   By: Merilyn Baba M.D.   On: 01/04/2021 18:47   CT HEAD WO CONTRAST (5MM)  Result Date: 01/04/2021 CLINICAL DATA:  Neck  trauma, dangerous injury mechanism (Age 69-64y); Head trauma, focal neuro findings (Age 37-64y) EXAM: CT HEAD WITHOUT CONTRAST CT CERVICAL SPINE WITHOUT CONTRAST TECHNIQUE:  Multidetector CT imaging of the head and cervical spine was performed following the standard protocol without intravenous contrast. Multiplanar CT image reconstructions of the cervical spine were also generated. COMPARISON:  Head CT 10/12/2019, cervical spine CT 11/10/2012. FINDINGS: CT HEAD FINDINGS Brain: There is a new hypodense right subdural collection, without visible acute blood products.There is adjacent loss of gray-white matter attenuation in the right parietal lobe, concerning for ischemia.There is a right frontal approach ventriculostomy catheter with tip unchanged in position in the midline between the frontal horns. Vascular: No hyperdense vessel or unexpected calcification. Skull: Right frontal burr hole. Prior suboccipital craniotomy. No acute fracture. Sinuses/Orbits: Paranasal sinuses are predominantly clear. Other: None. CT CERVICAL SPINE FINDINGS Alignment: Unchanged Skull base and vertebrae: No acute fracture. Soft tissues and spinal canal: VP shunt catheter courses down the right neck. No prevertebral soft tissue swelling. No visible canal hematoma. Disc levels: Enlargement of the spinal canal throughout the cervical spine. 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 Unchanged tubing within the spinal canal in comparison to prior exam in 2014. Upper chest: Negative. Other: None. IMPRESSION: Head CT: New hypodense right subdural collection measuring 0.9 cm, without evidence of acute blood products. Underlying loss of gray-white matter differentiation in the right parietal lobe, concerning for acute-subacute infarct. Recommend MRI. Cervical spine CT: No acute cervical spine fracture. Unchanged chronic congenital and postoperative changes. Critical Value/emergent results were called by telephone at the time of interpretation on 01/04/2021 at 2:43 pm to provider [facility] , who verbally acknowledged these results. Electronically Signed   By: Maurine Simmering M.D.   On: 01/04/2021  14:48   CT Cervical Spine Wo Contrast  Result Date: 01/04/2021 CLINICAL DATA:  Neck trauma, dangerous injury mechanism (Age 65-64y); Head trauma, focal neuro findings (Age 100-64y) EXAM: CT HEAD WITHOUT CONTRAST CT CERVICAL SPINE WITHOUT CONTRAST TECHNIQUE: Multidetector CT imaging of the head and cervical spine was performed following the standard protocol without intravenous contrast. Multiplanar CT image reconstructions of the cervical spine were also generated. COMPARISON:  Head CT 10/12/2019, cervical spine CT 11/10/2012. FINDINGS: CT HEAD FINDINGS Brain: There is a new hypodense right subdural collection, without visible acute blood products.There is adjacent loss of gray-white matter attenuation in the right parietal lobe, concerning for ischemia.There is a right frontal approach ventriculostomy catheter with tip unchanged in position in the midline between the frontal horns. Vascular: No hyperdense vessel or unexpected calcification. Skull: Right frontal burr hole. Prior suboccipital craniotomy. No acute fracture. Sinuses/Orbits: Paranasal sinuses are predominantly clear. Other: None. CT CERVICAL SPINE FINDINGS Alignment: Unchanged Skull base and vertebrae: No acute fracture. Soft tissues and spinal canal: VP shunt catheter courses down the right neck. No prevertebral soft tissue swelling. No visible canal hematoma. Disc levels: Enlargement of the spinal canal throughout the cervical spine. 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 Unchanged tubing within the spinal canal in comparison to prior exam in 2014. Upper chest: Negative. Other: None. IMPRESSION: Head CT: New hypodense right subdural collection measuring 0.9 cm, without evidence of acute blood products. Underlying loss of gray-white matter differentiation in the right parietal lobe, concerning for acute-subacute infarct. Recommend MRI. Cervical spine CT: No acute cervical spine fracture. Unchanged chronic congenital and  postoperative changes. Critical Value/emergent results were called by telephone at the time of interpretation on 01/04/2021 at 2:43 pm  to provider [facility] , who verbally acknowledged these results. Electronically Signed   By: Maurine Simmering M.D.   On: 01/04/2021 14:48   CT CHEST ABDOMEN PELVIS W CONTRAST  Result Date: 01/04/2021 CLINICAL DATA:  recurrent falls, VP shunt present EXAM: CT CHEST, ABDOMEN, AND PELVIS WITH CONTRAST TECHNIQUE: Multidetector CT imaging of the chest, abdomen and pelvis was performed following the standard protocol during bolus administration of intravenous contrast. CONTRAST:  2m OMNIPAQUE IOHEXOL 350 MG/ML SOLN COMPARISON:  None. FINDINGS: CT CHEST FINDINGS Cardiovascular: Normal cardiac size.No pericardial disease.Normal size main and branch pulmonary arteries.The thoracic aorta is unremarkable. Mediastinum/Nodes: No lymphadenopathy.The thyroid is unremarkable.Esophagus is unremarkable.The trachea is unremarkable. Lungs/Pleura: No focal airspace consolidation.No suspicious pulmonary nodules or masses.No pleural effusion.No pneumothorax. 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 Musculoskeletal: No acute osseous abnormality.No suspicious lytic or blastic lesions. Congenital enlarged spinal canal. CT ABDOMEN PELVIS FINDINGS Hepatobiliary: No hepatic injury or perihepatic hematoma. No gallstones, gallbladder wall thickening, or biliary dilatation. Pancreas: Unremarkable. No pancreatic ductal dilatation or surrounding inflammatory changes. Spleen: Focal peripheral somewhat wedge-shaped hypodensity in the upper posterior aspect of the spleen, morphology favoring small cyst, hemangioma, or possibly infarct (series 2, image 47) rather than laceration. Adrenals/Urinary Tract: No adrenal hemorrhage or renal injury identified. Bladder is unremarkable. No hydronephrosis or nephrolithiasis. No renal laceration. Subcentimeter hypodense right renal lesion, likely cyst. The  bladder is minimally distended but unremarkable. Stomach/Bowel: The stomach is within normal limits. There is no evidence of bowel obstruction. No evidence of appendicitis. Vascular/Lymphatic: Diffuse atherosclerotic calcified and noncalcified plaque throughout the abdominal aorta, which appears diminutive distally. Reproductive: Unremarkable. Other: No abdominal wall hernia or abnormality. No abdominopelvic ascites. VP shunt tubing terminates in the pelvis. Musculoskeletal: No acute osseous abnormality. Mild bilateral hip osteoarthritis. Diffuse muscle atrophy. Congenital enlargement of the spinal canal. IMPRESSION: No evidence of acute trauma in the chest. Focal peripheral hypodensity in the upper posterior aspect of the spleen, morphology favoring small cyst, hemangioma, or possibly splenic infarct, and felt unlikely to be acute trauma. No other acute findings in the abdomen or pelvis. Electronically Signed   By: JMaurine SimmeringM.D.   On: 01/04/2021 14:56   CT T-SPINE NO CHARGE  Result Date: 01/04/2021 CLINICAL DATA:  Fall EXAM: CT THORACIC SPINE WITHOUT CONTRAST TECHNIQUE: Multidetector CT images of the thoracic were obtained using the standard protocol without intravenous contrast. COMPARISON:  None. FINDINGS: Alignment: Normal. Vertebrae: No acute fracture or focal pathologic process. Paraspinal and other soft tissues: Reported separately on chest CT. Disc levels: Preserved disc heights. Chronic enlargement of the spinal canal. IMPRESSION: No acute thoracic spine fracture. Electronically Signed   By: JMaurine SimmeringM.D.   On: 01/04/2021 14:18   CT L-SPINE NO CHARGE  Result Date: 01/04/2021 CLINICAL DATA:  Fall EXAM: CT LUMBAR SPINE WITHOUT CONTRAST TECHNIQUE: Multidetector CT imaging of the lumbar spine was performed without intravenous contrast administration. Multiplanar CT image reconstructions were also generated. COMPARISON:  None. FINDINGS: Segmentation: 5 lumbar type vertebrae. Alignment: Normal  Vertebrae: No acute fracture or focal pathologic process. Paraspinal and other soft tissues: Reported separately on CT abdomen and pelvis Disc levels: No visible impingement. IMPRESSION: No acute lumbar spine fracture. Electronically Signed   By: JMaurine SimmeringM.D.   On: 01/04/2021 14:13   DG Chest Portable 1 View  Result Date: 01/04/2021 CLINICAL DATA:  Weakness, falls EXAM: PORTABLE CHEST 1 VIEW COMPARISON:  Chest radiograph 02/11/2013 FINDINGS: The patient tubing is seen over the right hemithorax. A focus  of calcification along the right aspect of the shunt projecting over the right apex is unchanged. The cardiomediastinal silhouette is normal. There is no focal consolidation or pulmonary edema. There is no pleural effusion or pneumothorax. Slight levocurvature of the upper thoracic spine is noted. There is no acute osseous abnormality. IMPRESSION: No radiographic evidence of acute cardiopulmonary process. Electronically Signed   By: Valetta Mole M.D.   On: 01/04/2021 12:51   ECHOCARDIOGRAM COMPLETE  Result Date: 01/05/2021    ECHOCARDIOGRAM REPORT   Patient Name:   Carolyn Herring Date of Exam: 01/05/2021 Medical Rec #:  [id number]        Height:       60.0 in Accession #:    [id number]       Weight:       106.9 lb Date of Birth:  [birth year]       BSA:          1.430 m Patient Age:    57 years         BP:           157/111 mmHg Patient Gender: F                HR:           97 bpm. Exam Location:  [facility] Procedure: 2D Echo, Color Doppler and Cardiac Doppler Indications:     I63.9 Stroke  History:         Patient has no prior history of Echocardiogram examinations.                  Risk Factors:Hypertension.  Sonographer:     Charmayne Sheer Referring Phys:  [phone] MCNEILL P KIRKPATRICK Diagnosing Phys: Nelva Bush MD  Sonographer Comments: Suboptimal subcostal window. IMPRESSIONS  1. Left ventricular ejection fraction, by estimation, is 55 to 60%. The left ventricle has normal function. The left ventricle  has no regional wall motion abnormalities. Left ventricular diastolic parameters are consistent with Grade I diastolic dysfunction (impaired relaxation). Elevated left atrial pressure.  2. Right ventricular systolic function is normal. The right ventricular size is normal.  3. The mitral valve is abnormal. Cannot exclude prolapse of posterior leaflet or cleft. There is at least mild to moderate mitral valve regurgitation. No evidence of mitral stenosis.  4. The aortic valve is tricuspid. Aortic valve regurgitation is not visualized. No aortic stenosis is present.  5. The inferior vena cava is normal in size with <50% respiratory variability, suggesting right atrial pressure of 8 mmHg. FINDINGS  Left Ventricle: Left ventricular ejection fraction, by estimation, is 55 to 60%. The left ventricle has normal function. The left ventricle has no regional wall motion abnormalities. The left ventricular internal cavity size was normal in size. There is  no left ventricular hypertrophy. Left ventricular diastolic parameters are consistent with Grade I diastolic dysfunction (impaired relaxation). Elevated left atrial pressure. Right Ventricle: The right ventricular size is normal. No increase in right ventricular wall thickness. Right ventricular systolic function is normal. Left Atrium: Left atrial size was normal in size. Right Atrium: Right atrial size was normal in size. Pericardium: There is no evidence of pericardial effusion. Presence of pericardial fat pad. Mitral Valve: The mitral valve is abnormal. Mild to moderate mitral valve regurgitation. No evidence of mitral valve stenosis. MV peak gradient, 6.0 mmHg. The mean mitral valve gradient is 3.0 mmHg. Tricuspid Valve: The tricuspid valve is not well visualized. Tricuspid valve regurgitation is trivial. Aortic Valve: The aortic valve  is tricuspid. Aortic valve regurgitation is not visualized. No aortic stenosis is present. Aortic valve mean gradient measures 6.0 mmHg.  Aortic valve peak gradient measures 11.3 mmHg. Aortic valve area, by VTI measures 2.04  cm. Pulmonic Valve: The pulmonic valve was not well visualized. Pulmonic valve regurgitation is trivial. No evidence of pulmonic stenosis. Aorta: The aortic root and ascending aorta are structurally normal, with no evidence of dilitation. Pulmonary Artery: The pulmonary artery is not well seen. Venous: The inferior vena cava is normal in size with less than 50% respiratory variability, suggesting right atrial pressure of 8 mmHg. IAS/Shunts: The interatrial septum was not well visualized.  LEFT VENTRICLE PLAX 2D LVIDd:         4.40 cm   Diastology LVIDs:         3.16 cm   LV e' medial:    6.31 cm/s LV PW:         0.85 cm   LV E/e' medial:  16.5 LV IVS:        0.66 cm   LV e' lateral:   7.83 cm/s LVOT diam:     1.80 cm   LV E/e' lateral: 13.3 LV SV:         63 LV SV Index:   44 LVOT Area:     2.54 cm  RIGHT VENTRICLE RV Basal diam:  2.72 cm RV S prime:     15.60 cm/s LEFT ATRIUM             Index        RIGHT ATRIUM          Index LA diam:        2.80 cm 1.96 cm/m   RA Area:     8.05 cm LA Vol (A2C):   26.3 ml 18.39 ml/m  RA Volume:   16.00 ml 11.19 ml/m LA Vol (A4C):   32.9 ml 23.00 ml/m LA Biplane Vol: 29.9 ml 20.90 ml/m  AORTIC VALVE                     PULMONIC VALVE AV Area (Vmax):    1.95 cm      PV Vmax:       1.16 m/s AV Area (Vmean):   1.94 cm      PV Vmean:      74.500 cm/s AV Area (VTI):     2.04 cm      PV VTI:        0.167 m AV Vmax:           168.00 cm/s   PV Peak grad:  5.4 mmHg AV Vmean:          119.000 cm/s  PV Mean grad:  2.0 mmHg AV VTI:            0.309 m AV Peak Grad:      11.3 mmHg AV Mean Grad:      6.0 mmHg LVOT Vmax:         129.00 cm/s LVOT Vmean:        90.500 cm/s LVOT VTI:          0.248 m LVOT/AV VTI ratio: 0.80  AORTA Ao Root diam: 2.60 cm MITRAL VALVE MV Area (PHT): 6.96 cm     SHUNTS MV Area VTI:   2.99 cm     Systemic VTI:  0.25 m MV Peak grad:  6.0 mmHg     Systemic Diam: 1.80 cm MV  Mean  grad:  3.0 mmHg MV Vmax:       1.22 m/s MV Vmean:      79.0 cm/s MV Decel Time: 109 msec MV E velocity: 104.00 cm/s MV A velocity: 124.00 cm/s MV E/A ratio:  0.84 Christopher End MD Electronically signed by Nelva Bush MD Signature Date/Time: 01/05/2021/10:54:03 AM    Final     Scheduled Meds:  [MAR Hold] budesonide (PULMICORT) nebulizer solution  0.5 mg Nebulization BID   [MAR Hold] Chlorhexidine Gluconate Cloth  6 each Topical Q0600   [MAR Hold] methylPREDNISolone (SOLU-MEDROL) injection  20 mg Intravenous Q12H   [MAR Hold] pantoprazole  40 mg Intravenous Q12H   Continuous Infusions:  sodium chloride     [MAR Hold] ampicillin-sulbactam (UNASYN) IV 3 g (01/05/21 0904)   pantoprazole 8 mg/hr (01/05/21 0238)     LOS: 1 day   Time spent: 45 minutes. More than 50% of the time was spent in counseling/coordination of care  Lorella Nimrod, MD [facility]  If 7PM-7AM, please contact night-coverage [email or web address]  01/05/2021, 1:33 PM   This record has been created using Systems analyst. Errors have been sought and corrected,but may not always be located. Such creation errors do not reflect on the standard of care.

## 2021-01-05 NOTE — Consult Note (Signed)
PHARMACY CONSULT NOTE - FOLLOW UP  Pharmacy Consult for Electrolyte Monitoring and Replacement   Recent Labs: Potassium (mmol/L)  Date Value  01/05/2021 4.1  02/11/2013 5.1   Magnesium (mg/dL)  Date Value  97/98/9211 2.0   Calcium (mg/dL)  Date Value  94/17/4081 8.1 (L)   Calcium, Total (mg/dL)  Date Value  44/81/8563 9.1   Albumin (g/dL)  Date Value  14/97/0263 2.1 (L)  02/11/2013 3.5   Phosphorus (mg/dL)  Date Value  78/58/8502 2.9   Sodium (mmol/L)  Date Value  01/05/2021 138  02/11/2013 138   Corrected Ca: 9.5  Assessment: 44yo F with PMH of HTN, hydrocephalus and spina bifida status post VP shunt with cervical tethered cord release in 2011 last revised in 2019 who was admitted for fall. Pharmacy is consulted for electrolyte replacement.   K: 3.4>4.1 Phos 2.9 Mg 2.0  Goal of Therapy:  Electrolytes within normal limits   Plan:  K 3.4>4.1 after KCL PO x1 yesterday No further repletion at this time. All other lytes WNL. Will f/u with AM labs and assess need for additional replacement  Martyn Malay, PharmD, [facility] Clinical Pharmacist 01/05/2021 8:44 AM

## 2021-01-05 NOTE — Progress Notes (Signed)
PHARMACY - PHYSICIAN COMMUNICATION CRITICAL VALUE ALERT - BLOOD CULTURE IDENTIFICATION (BCID)  Carolyn Herring is an 45 y.o. female who presented to [facility] on 01/04/2021 with a chief complaint of weakness and fall.   Assessment: 10/10 blood culture (single) with yeast, BCID with C. krusei.  Patient on ampicillin/sulbactam for aspiration pneumoniae  Name of physician (or Provider) Contacted: Dr Nelson Chimes and Dr Sampson Goon  Current antibiotics: ampicillin/sulbactam  Changes to prescribed antibiotics recommended:  Recommendations accepted by provider  Results for orders placed or performed during the hospital encounter of 01/04/21  Blood Culture ID Panel (Reflexed) (Collected: 01/04/2021  2:58 PM)  Result Value Ref Range   Enterococcus faecalis NOT DETECTED NOT DETECTED   Enterococcus Faecium NOT DETECTED NOT DETECTED   Listeria monocytogenes NOT DETECTED NOT DETECTED   Staphylococcus species NOT DETECTED NOT DETECTED   Staphylococcus aureus (BCID) NOT DETECTED NOT DETECTED   Staphylococcus epidermidis NOT DETECTED NOT DETECTED   Staphylococcus lugdunensis NOT DETECTED NOT DETECTED   Streptococcus species NOT DETECTED NOT DETECTED   Streptococcus agalactiae NOT DETECTED NOT DETECTED   Streptococcus pneumoniae NOT DETECTED NOT DETECTED   Streptococcus pyogenes NOT DETECTED NOT DETECTED   A.calcoaceticus-baumannii NOT DETECTED NOT DETECTED   Bacteroides fragilis NOT DETECTED NOT DETECTED   Enterobacterales NOT DETECTED NOT DETECTED   Enterobacter cloacae complex NOT DETECTED NOT DETECTED   Escherichia coli NOT DETECTED NOT DETECTED   Klebsiella aerogenes NOT DETECTED NOT DETECTED   Klebsiella oxytoca NOT DETECTED NOT DETECTED   Klebsiella pneumoniae NOT DETECTED NOT DETECTED   Proteus species NOT DETECTED NOT DETECTED   Salmonella species NOT DETECTED NOT DETECTED   Serratia marcescens NOT DETECTED NOT DETECTED   Haemophilus influenzae NOT DETECTED NOT DETECTED   Neisseria  meningitidis NOT DETECTED NOT DETECTED   Pseudomonas aeruginosa NOT DETECTED NOT DETECTED   Stenotrophomonas maltophilia NOT DETECTED NOT DETECTED   Candida albicans NOT DETECTED NOT DETECTED   Candida auris NOT DETECTED NOT DETECTED   Candida glabrata NOT DETECTED NOT DETECTED   Candida krusei DETECTED (A) NOT DETECTED   Candida parapsilosis NOT DETECTED NOT DETECTED   Candida tropicalis NOT DETECTED NOT DETECTED   Cryptococcus neoformans/gattii NOT DETECTED NOT DETECTED    Juliette Alcide, PharmD, BCPS.   Work Cell: [phone] 01/05/2021 3:17 PM

## 2021-01-05 NOTE — Consult Note (Signed)
Infectious Disease     Reason for Consult:Candidemia    Referring Physician: Dr Mortimer Fries Date of Admission:  01/04/2021   Active Problems:   Hemorrhagic shock ([place])   Fall   Altered mental status   History of brain shunt   HPI: Carolyn Herring is a 45 y.o. female a PMH of HTN, Hydrocephalus, and 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 last revised in 2019..  She presented to [facility] ER on 10/10 via EMS from home with recurrent falls, generalized weakness, and back pain as well as several week hx of abdominal pain and diarrhea. .  She does live alone, but her aunt lives next door.  On ED arrival patient was hypotensive with systolic in 82X, heart rate 57.  Blood pressure improved with 2 L of LR bolus.  Lactic acidosis at 3.6, procalcitonin at 24.93, WBCs of 30.1, hemoglobin of 2.7, hematocrit of 10.4 and stool occult positive.  Patient was using Goody powder and ibuprofen frequently for abdominal pain.  Massive blood transfusion protocol activated on arrival to ED but patient only received 2 units of PRBC and 1 unit of FFP.  Hemoglobin improved to 11.8, doubtful that original reading was real, She was started on Zosyn empirically for a possible intra-abdominal infection.  CT abdomen and pelvis was without any acute significant abnormality.  There was a focal peripheral hypodensity in the upper posterior aspect of the spleen, possibly a small cyst, hemangioma or a possible small splenic infarct.    Since admit had EGD with large ulcer, not actively bleeding.   CT chest abd pelvis negative.  CT C, T, L spine nml. CT head concern for new hypodense Subdural collection and possible R parietal subacute infarct. WBC was 30 now down to 20. BCX + Candida krusei.  Currently she is alert and interactive. States she is at baseline. She denies any current pain. Denies any recent  fevers, chills, NS. She denies any recent issues with her shunt.   Past Medical History:  Diagnosis Date    Hypertension    Spina bifida ([place])    History reviewed. No pertinent surgical history. Social History   Tobacco Use   Smoking status: Every Day    Packs/day: 0.50    Types: Cigarettes   Smokeless tobacco: Never  Vaping Use   Vaping Use: Never used  Substance Use Topics   Alcohol use: Never   Drug use: Never   History reviewed. No pertinent family history.  Allergies:  Allergies  Allergen Reactions   Tape Dermatitis    **BLISTERS** **BLISTERS**    Codeine Itching   Hydrogen Peroxide Other (See Comments)    Skin Irritation   Latex Other (See Comments)    Skin Irritation; blisters     Current antibiotics: Antibiotics Given (last 72 hours)     Date/Time Action Medication Dose Rate   01/04/21 1520 New Bag/Given   piperacillin-tazobactam (ZOSYN) IVPB 3.375 g 3.375 g 100 mL/hr   01/04/21 2204 New Bag/Given   Ampicillin-Sulbactam (UNASYN) 3 g in sodium chloride 0.9 % 100 mL IVPB 3 g 200 mL/hr   01/05/21 0426 New Bag/Given   Ampicillin-Sulbactam (UNASYN) 3 g in sodium chloride 0.9 % 100 mL IVPB 3 g 200 mL/hr   01/05/21 0904 New Bag/Given   Ampicillin-Sulbactam (UNASYN) 3 g in sodium chloride 0.9 % 100 mL IVPB 3 g 200 mL/hr   01/05/21 1501 New Bag/Given   Ampicillin-Sulbactam (UNASYN) 3 g in sodium chloride 0.9 % 100  mL IVPB 3 g 200 mL/hr       MEDICATIONS:  budesonide (PULMICORT) nebulizer solution  0.5 mg Nebulization BID   Chlorhexidine Gluconate Cloth  6 each Topical Q0600   methylPREDNISolone (SOLU-MEDROL) injection  20 mg Intravenous Q12H   [START ON 01/08/2021] pantoprazole  40 mg Intravenous Q12H    Review of Systems - 11 systems reviewed and negative per HPI   OBJECTIVE: Temp:  [97.5 F (36.4 C)-98.8 F (37.1 C)] 97.7 F (36.5 C) (10/11 1405) Pulse Rate:  [80-114] 87 (10/11 1400) Resp:  [13-28] 15 (10/11 1400) BP: (129-163)/(87-122) 153/117 (10/11 1400) SpO2:  [89 %-100 %] 96 % (10/11 1405) Weight:  [48.5 kg-49.2 kg] 48.5 kg (10/11 0426) Physical  Exam  Constitutional:  oriented to person, place, and time. 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  Mouth/Throat: edentulous Oropharynx is clear and dry . No oropharyngeal exudate.  Cardiovascular: Normal rate, regular rhythm and normal heart sounds.  No murmur heard.  Pulmonary/Chest: Effort normal and breath sounds normal. No respiratory distress.  has no wheezes.  Neck = supple, no nuchal rigidity Abdominal: Soft. Bowel sounds are normal.  exhibits no distension. There is no tenderness.  Lymphadenopathy: no cervical adenopathy. 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 Some contractures on hands Skin: Skin is warm and dry. No rash noted. No erythema.  Psychiatric: a normal mood and affect.  behavior is normal.    LABS: Results for orders placed or performed during the hospital encounter of 01/04/21 (from the past 48 hour(s))  CBC with Differential     Status: Abnormal   Collection Time: 01/04/21 12:05 PM  Result Value Ref Range   WBC 30.2 (H) 4.0 - 10.5 K/uL   RBC 1.60 (L) 3.87 - 5.11 MIL/uL   Hemoglobin 2.7 (LL) 12.0 - 15.0 g/dL    Comment: Reticulocyte Hemoglobin testing may be clinically indicated, consider ordering this additional test [id number] THIS CRITICAL RESULT HAS VERIFIED AND BEEN CALLED TO HEATHER LEE BY HOFFMAN, JAMIE ON 10 10 2022 AT 1352, AND HAS BEEN READ BACK.     HCT 10.4 (LL) 36.0 - 46.0 %    Comment: This critical result has verified and been called to [place] by Margreta Journey on 10 10 2022 at 1352, and has been read back.    MCV 65.0 (L) 80.0 - 100.0 fL   MCH 16.9 (L) 26.0 - 34.0 pg   MCHC 26.0 (L) 30.0 - 36.0 g/dL   RDW 29.9 (H) 11.5 - 15.5 %   Platelets 721 (H) 150 - 400 K/uL   nRBC 2.0 (H) 0.0 - 0.2 %   Neutrophils Relative % 90 %   Neutro Abs 27.0 (H) 1.7 - 7.7 K/uL   Lymphocytes Relative 4 %   Lymphs Abs 1.3 0.7 - 4.0 K/uL    Monocytes Relative 4 %   Monocytes Absolute 1.3 (H) 0.1 - 1.0 K/uL   Eosinophils Relative 0 %   Eosinophils Absolute 0.0 0.0 - 0.5 K/uL   Basophils Relative 0 %   Basophils Absolute 0.0 0.0 - 0.1 K/uL   WBC Morphology SMUDGE CELLS    RBC Morphology MIXED RBC POPULATION    Smear Review PLATELETS APPEAR INCREASED    Immature Granulocytes 2 %   Abs Immature Granulocytes 0.49 (H) 0.00 - 0.07 K/uL   Target Cells PRESENT     Comment: Performed at  [facility], [place], [place] [address]  Lactic acid, plasma     Status: Abnormal   Collection Time: 01/04/21 12:05 PM  Result Value Ref Range   Lactic Acid, Venous 3.6 (HH) 0.5 - 1.9 mmol/L    Comment: CRITICAL RESULT CALLED TO, READ BACK BY AND VERIFIED WITH  LINDA MCLAMB ON 01/04/21 AT 1333 BY MU/DAS Performed at [facility], [address], [place], [place] [address]   Resp Panel by RT-PCR (Flu A&B, Covid) Nasopharyngeal Swab     Status: None   Collection Time: 01/04/21 12:05 PM   Specimen: Nasopharyngeal Swab; Nasopharyngeal(NP) swabs in vial transport medium  Result Value Ref Range   SARS Coronavirus 2 by RT PCR NEGATIVE NEGATIVE    Comment: (NOTE) SARS-CoV-2 target nucleic acids are NOT DETECTED.  The SARS-CoV-2 RNA is generally detectable in upper respiratory specimens during the acute phase of infection. The lowest concentration of SARS-CoV-2 viral copies this assay can detect is 138 copies/mL. A negative result does not preclude SARS-Cov-2 infection and should not be used as the sole basis for treatment or other patient management decisions. A negative result may occur with  improper specimen collection/handling, submission of specimen other than nasopharyngeal swab, presence of viral mutation(s) within the areas targeted by this assay, and inadequate number of viral copies(<138 copies/mL). A negative result must be combined with clinical observations, patient history, and  epidemiological information. The expected result is Negative.  Fact Sheet for Patients:  EntrepreneurPulse.com.au  Fact Sheet for Healthcare Providers:  IncredibleEmployment.be  This test is no t yet approved or cleared by the [place] FDA and  has been authorized for detection and/or diagnosis of SARS-CoV-2 by FDA under an Emergency Use Authorization (EUA). This EUA will remain  in effect (meaning this test can be used) for the duration of the COVID-19 declaration under Section 564(b)(1) of the Act, 21 U.S.C.section 360bbb-3(b)(1), unless the authorization is terminated  or revoked sooner.       Influenza A by PCR NEGATIVE NEGATIVE   Influenza B by PCR NEGATIVE NEGATIVE    Comment: (NOTE) The Xpert Xpress SARS-CoV-2/FLU/RSV plus assay is intended as an aid in the diagnosis of influenza from Nasopharyngeal swab specimens and should not be used as a sole basis for treatment. Nasal washings and aspirates are unacceptable for Xpert Xpress SARS-CoV-2/FLU/RSV testing.  Fact Sheet for Patients: EntrepreneurPulse.com.au  Fact Sheet for Healthcare Providers: IncredibleEmployment.be  This test is not yet approved or cleared by the [place] FDA and has been authorized for detection and/or diagnosis of SARS-CoV-2 by FDA under an Emergency Use Authorization (EUA). This EUA will remain in effect (meaning this test can be used) for the duration of the COVID-19 declaration under Section 564(b)(1) of the Act, 21 U.S.C. section 360bbb-3(b)(1), unless the authorization is terminated or revoked.  Performed at [facility], [address] [place], [place] [address]   Lipase, blood     Status: None   Collection Time: 01/04/21 12:05 PM  Result Value Ref Range   Lipase 50 11 - 51 U/L    Comment: Performed at [facility], [address] [place], [place] [address]  hCG, quantitative,  pregnancy     Status: None   Collection Time: 01/04/21 12:05 PM  Result Value Ref Range   hCG, Beta Chain, Quant, S 2 <5 mIU/mL    Comment:          GEST. AGE      CONC.  (mIU/mL)   <=  1 WEEK        5 - 50     2 WEEKS       50 - 500     3 WEEKS       100 - 10,000     4 WEEKS     1,000 - 30,000     5 WEEKS     3,500 - 115,000   6-8 WEEKS     12,000 - 270,000    12 WEEKS     15,000 - 220,000        FEMALE AND NON-PREGNANT FEMALE:     LESS THAN 5 mIU/mL Performed at [facility], [place], [place] [address]   Troponin I (High Sensitivity)     Status: None   Collection Time: 01/04/21 12:05 PM  Result Value Ref Range   Troponin I (High Sensitivity) 12 <18 ng/L    Comment: (NOTE) Elevated high sensitivity troponin I (hsTnI) values and significant  changes across serial measurements may suggest ACS but many other  chronic and acute conditions are known to elevate hsTnI results.  Refer to the "Links" section for chest pain algorithms and additional  guidance. Performed at [facility], [address] [place], [place] [address]   CK     Status: None   Collection Time: 01/04/21 12:05 PM  Result Value Ref Range   Total CK 85 38 - 234 U/L    Comment: Performed at [facility], [address] [place], [place] [address]  Comprehensive metabolic panel     Status: Abnormal   Collection Time: 01/04/21 12:05 PM  Result Value Ref Range   Sodium 135 135 - 145 mmol/L   Potassium 3.4 (L) 3.5 - 5.1 mmol/L   Chloride 103 98 - 111 mmol/L   CO2 23 22 - 32 mmol/L   Glucose, Bld 144 (H) 70 - 99 mg/dL    Comment: Glucose reference range applies only to samples taken after fasting for at least 8 hours.   BUN 36 (H) 6 - 20 mg/dL   Creatinine, Ser 0.86 0.44 - 1.00 mg/dL   Calcium 8.0 (L) 8.9 - 10.3 mg/dL   Total Protein 5.5 (L) 6.5 - 8.1 g/dL   Albumin 2.1 (L) 3.5 - 5.0 g/dL   AST 20 15 - 41 U/L   ALT 13 0 - 44 U/L   Alkaline Phosphatase 82 38 - 126 U/L    Total Bilirubin 0.6 0.3 - 1.2 mg/dL   GFR, Estimated >60 >60 mL/min    Comment: (NOTE) Calculated using the CKD-EPI Creatinine Equation (2021)    Anion gap 9 5 - 15    Comment: Performed at [facility], [address] [place],  [address]  Procalcitonin     Status: None   Collection Time: 01/04/21 12:05 PM  Result Value Ref Range   Procalcitonin 24.93 ng/mL    Comment:        Interpretation: PCT >= 10 ng/mL: Important systemic inflammatory response, almost exclusively due to severe bacterial sepsis or septic shock. (NOTE)       Sepsis PCT Algorithm           Lower Respiratory Tract                                      Infection PCT Algorithm    ----------------------------     ----------------------------  PCT < 0.25 ng/mL                PCT < 0.10 ng/mL          Strongly encourage             Strongly discourage   discontinuation of antibiotics    initiation of antibiotics    ----------------------------     -----------------------------       PCT 0.25 - 0.50 ng/mL            PCT 0.10 - 0.25 ng/mL               OR       >80% decrease in PCT            Discourage initiation of                                            antibiotics      Encourage discontinuation           of antibiotics    ----------------------------     -----------------------------         PCT >= 0.50 ng/mL              PCT 0.26 - 0.50 ng/mL                AND       <80% decrease in PCT             Encourage initiation of                                             antibiotics       Encourage continuation           of antibiotics    ----------------------------     -----------------------------        PCT >= 0.50 ng/mL                  PCT > 0.50 ng/mL               AND         increase in PCT                  Strongly encourage                                      initiation of antibiotics    Strongly encourage escalation           of antibiotics                                      -----------------------------                                           PCT <= 0.25 ng/mL  OR                                        > 80% decrease in PCT                                      Discontinue / Do not initiate                                             antibiotics  Performed at [facility], [address] [place], [place] [address]   Protime-INR     Status: Abnormal   Collection Time: 01/04/21 12:05 PM  Result Value Ref Range   Prothrombin Time 15.6 (H) 11.4 - 15.2 seconds   INR 1.2 0.8 - 1.2    Comment: (NOTE) INR goal varies based on device and disease states. Performed at [facility], [place], [place] [address]   APTT     Status: None   Collection Time: 01/04/21 12:05 PM  Result Value Ref Range   aPTT 31 24 - 36 seconds    Comment: Performed at [facility], [address] [place], [place] [address]  Type and screen [place]     Status: None (Preliminary result)   Collection Time: 01/04/21  1:10 PM  Result Value Ref Range   ABO/RH(D) O POS    Antibody Screen NEG    Sample Expiration 01/07/2021,2359    Unit Number [id number]    Blood Component Type RBC LR PHER1    Unit division 00    Status of Unit REL FROM [facility]    Transfusion Status OK TO TRANSFUSE    Crossmatch Result Compatible    Unit Number [id number]    Blood Component Type RED CELLS,LR    Unit division 00    Status of Unit REL FROM [facility]    Transfusion Status OK TO TRANSFUSE    Crossmatch Result Compatible    Unit Number [id number]    Blood Component Type RBC LR PHER2    Unit division 00    Status of Unit ALLOCATED    Transfusion Status OK TO TRANSFUSE    Crossmatch Result Compatible    Unit Number [id number]    Blood Component Type RED CELLS,LR    Unit division 00    Status of Unit ALLOCATED    Transfusion Status OK TO TRANSFUSE    Crossmatch Result  Compatible    Unit Number [id number]    Blood Component Type RED CELLS,LR    Unit division 00    Status of Unit ISSUED    Transfusion Status OK TO TRANSFUSE    Crossmatch Result COMPATIBLE    Unit Number [id number]    Blood Component Type RED CELLS,LR    Unit division 00    Status of Unit ISSUED    Transfusion Status OK TO TRANSFUSE    Crossmatch Result COMPATIBLE    Unit Number [id number]    Blood Component Type RBC, LR IRR    Unit division 00    Status of Unit ISSUED    Transfusion Status OK TO TRANSFUSE    Crossmatch Result COMPATIBLE    Unit Number  [id number]    Blood Component Type RED CELLS,LR    Unit division 00    Status of Unit ISSUED    Transfusion Status OK TO TRANSFUSE    Crossmatch Result COMPATIBLE   Initiate MTP (Blood Bank Notification)     Status: None   Collection Time: 01/04/21  1:51 PM  Result Value Ref Range   Initiate Massive Transfusion Protocol      START AT 0312 ON 01/04/2021  MTP CONTACT NAME IS Toni Arthurs, RN  ENDED AT 1720 AMK MTP ORDER RECEIVED Performed at [facility], [address], [place], [place] [address]   ABO/Rh     Status: None   Collection Time: 01/04/21  1:57 PM  Result Value Ref Range   ABO/RH(D)      O POS Performed at [facility], [address], [place], [place] [address]   Prepare fresh frozen plasma     Status: None (Preliminary result)   Collection Time: 01/04/21  2:06 PM  Result Value Ref Range   Unit Number [id number]    Blood Component Type THAWED PLASMA    Unit division 00    Status of Unit ISSUED    Transfusion Status OK TO TRANSFUSE    Unit Number [id number]    Blood Component Type THAWED PLASMA    Unit division 00    Status of Unit OUTDATED/DESTROYED    Transfusion Status OK TO TRANSFUSE    Unit Number [id number]    Blood Component Type THAWED PLASMA    Unit division 00    Status of Unit OUTDATED/DESTROYED    Transfusion Status      OK TO TRANSFUSE Performed  at [facility], [address] [place], [place] [address]   Hemoglobin and hematocrit, blood (STAT)     Status: Abnormal   Collection Time: 01/04/21  2:58 PM  Result Value Ref Range   Hemoglobin 11.1 (L) 12.0 - 15.0 g/dL   HCT 32.1 (L) 36.0 - 46.0 %    Comment: Performed at [facility], [address], [place], [place] [address]  Prepare RBC (crossmatch)     Status: None   Collection Time: 01/04/21  2:58 PM  Result Value Ref Range   Order Confirmation      ORDER PROCESSED BY BLOOD BANK Performed at [facility], [address] [place], [place] [address]   Reticulocytes     Status: None   Collection Time: 01/04/21  2:58 PM  Result Value Ref Range   Retic Ct Pct 1.8 0.4 - 3.1 %   RBC. 4.05 3.87 - 5.11 MIL/uL   Retic Count, Absolute 73.2 19.0 - 186.0 K/uL   Immature Retic Fract 11.2 2.3 - 15.9 %    Comment: Performed at [facility], [address], [place], [place] [address]  Pathologist smear review     Status: None   Collection Time: 01/04/21  2:58 PM  Result Value Ref Range   Path Review Blood smear is reviewed.     Comment: Hypochromic anemia with anisopoikilocytosis and polychromasia. Morphology of WBCs and platelets within normal limits. No evidence of circulating blasts or schistocytes. Reviewed by Elmon Kirschner, M.D. Performed at [facility], [address] [place], [place] [address]   Blood culture (routine single)     Status: None (Preliminary result)   Collection Time: 01/04/21  2:58 PM   Specimen: BLOOD  Result Value Ref Range   Specimen Description BLOOD LEFT ANTECUBITAL  Special Requests      BOTTLES DRAWN AEROBIC AND ANAEROBIC Blood Culture adequate volume   Culture  Setup Time      YEAST ANAEROBIC BOTTLE ONLY Organism ID to follow CRITICAL RESULT CALLED TO, READ BACK BY AND VERIFIED WITH: SAMANTHA RAUER 01/05/21 1507 SLM Performed at [facility], [address] [place], [place]  [address]    Culture YEAST    Report Status PENDING   Blood Culture ID Panel (Reflexed)     Status: Abnormal   Collection Time: 01/04/21  2:58 PM  Result Value Ref Range   Enterococcus faecalis NOT DETECTED NOT DETECTED   Enterococcus Faecium NOT DETECTED NOT DETECTED   Listeria monocytogenes NOT DETECTED NOT DETECTED   Staphylococcus species NOT DETECTED NOT DETECTED   Staphylococcus aureus (BCID) NOT DETECTED NOT DETECTED   Staphylococcus epidermidis NOT DETECTED NOT DETECTED   Staphylococcus lugdunensis NOT DETECTED NOT DETECTED   Streptococcus species NOT DETECTED NOT DETECTED   Streptococcus agalactiae NOT DETECTED NOT DETECTED   Streptococcus pneumoniae NOT DETECTED NOT DETECTED   Streptococcus pyogenes NOT DETECTED NOT DETECTED   A.calcoaceticus-baumannii NOT DETECTED NOT DETECTED   Bacteroides fragilis NOT DETECTED NOT DETECTED   Enterobacterales NOT DETECTED NOT DETECTED   Enterobacter cloacae complex NOT DETECTED NOT DETECTED   Escherichia coli NOT DETECTED NOT DETECTED   Klebsiella aerogenes NOT DETECTED NOT DETECTED   Klebsiella oxytoca NOT DETECTED NOT DETECTED   Klebsiella pneumoniae NOT DETECTED NOT DETECTED   Proteus species NOT DETECTED NOT DETECTED   Salmonella species NOT DETECTED NOT DETECTED   Serratia marcescens NOT DETECTED NOT DETECTED   Haemophilus influenzae NOT DETECTED NOT DETECTED   Neisseria meningitidis NOT DETECTED NOT DETECTED   Pseudomonas aeruginosa NOT DETECTED NOT DETECTED   Stenotrophomonas maltophilia NOT DETECTED NOT DETECTED   Candida albicans NOT DETECTED NOT DETECTED   Candida auris NOT DETECTED NOT DETECTED   Candida glabrata NOT DETECTED NOT DETECTED   Candida krusei DETECTED (A) NOT DETECTED    Comment: CRITICAL RESULT CALLED TO, READ BACK BY AND VERIFIED WITH: SAMANTHA RAUER 01/05/21 1507 SLM    Candida parapsilosis NOT DETECTED NOT DETECTED   Candida tropicalis NOT DETECTED NOT DETECTED   Cryptococcus neoformans/gattii NOT DETECTED  NOT DETECTED    Comment: Performed at [facility], [address] [place], [place] [address]  Lactic acid, plasma     Status: Abnormal   Collection Time: 01/04/21  3:47 PM  Result Value Ref Range   Lactic Acid, Venous 2.5 (HH) 0.5 - 1.9 mmol/L    Comment: CRITICAL VALUE NOTED. VALUE IS CONSISTENT WITH PREVIOUSLY REPORTED/CALLED VALUE MJU Performed at [facility], [place], [place] [address]   Troponin I (High Sensitivity)     Status: Abnormal   Collection Time: 01/04/21  3:47 PM  Result Value Ref Range   Troponin I (High Sensitivity) 30 (H) <18 ng/L    Comment: (NOTE) Elevated high sensitivity troponin I (hsTnI) values and significant  changes across serial measurements may suggest ACS but many other  chronic and acute conditions are known to elevate hsTnI results.  Refer to the "Links" section for chest pain algorithms and additional  guidance. Performed at [facility], [address] [place], [place] [address]   DIC Panel now then every 30 minutes     Status: Abnormal   Collection Time: 01/04/21  3:47 PM  Result Value Ref Range   Prothrombin Time 15.2 11.4 - 15.2 seconds   INR 1.2  0.8 - 1.2    Comment: (NOTE) INR goal varies based on device and disease states.    aPTT 30 24 - 36 seconds   Fibrinogen 362 210 - 475 mg/dL    Comment: (NOTE) Fibrinogen results may be underestimated in patients receiving thrombolytic therapy.    D-Dimer, Quant 0.57 (H) 0.00 - 0.50 ug/mL-FEU    Comment: (NOTE) At the manufacturer cut-off value of 0.5 g/mL FEU, this assay has a negative predictive value of 95-100%.This assay is intended for use in conjunction with a clinical pretest probability (PTP) assessment model to exclude pulmonary embolism (PE) and deep venous thrombosis (DVT) in outpatients suspected of PE or DVT. Results should be correlated with clinical presentation.    Platelets 372 150 - 400 K/uL   Smear Review TARGET CELLS      Comment: BURR CELLS GIANT PLATELETS SEEN PLATELETS APPEAR INCREASED MORPHOLOGY UNREMARKABLE Performed at [facility], [address] [place], [place] [address]   Ferritin     Status: None   Collection Time: 01/04/21  3:47 PM  Result Value Ref Range   Ferritin 21 11 - 307 ng/mL    Comment: Performed at [facility], [address] [place], [place] [address]  Iron and TIBC     Status: None   Collection Time: 01/04/21  3:47 PM  Result Value Ref Range   Iron 53 28 - 170 ug/dL   TIBC 255 250 - 450 ug/dL   Saturation Ratios 21 10.4 - 31.8 %   UIBC 202 ug/dL    Comment: Performed at [facility], [address] [place], [place] [address]  Folate, serum, performed at [facility] lab     Status: None   Collection Time: 01/04/21  3:47 PM  Result Value Ref Range   Folate 21.5 >5.9 ng/mL    Comment: Performed at [facility], [address] [place], [place] [address]  Lactate dehydrogenase     Status: None   Collection Time: 01/04/21  3:47 PM  Result Value Ref Range   LDH 146 98 - 192 U/L    Comment: Performed at [facility] [facility], [address] [place], [place] [address]  MRSA Next Gen by PCR, Nasal     Status: None   Collection Time: 01/04/21  4:58 PM   Specimen: Nasal Mucosa; Nasal Swab  Result Value Ref Range   MRSA by PCR Next Gen NOT DETECTED NOT DETECTED    Comment: (NOTE) The GeneXpert MRSA Assay (FDA approved for NASAL specimens only), is one component of a comprehensive MRSA colonization surveillance program. It is not intended to diagnose MRSA infection nor to guide or monitor treatment for MRSA infections. Test performance is not FDA approved in patients less than 11 years old. Performed at [facility], [address] [place], [place] [address]   Glucose, capillary     Status: Abnormal   Collection Time: 01/04/21  5:04 PM  Result Value Ref Range   Glucose-Capillary 100 (H) 70 - 99 mg/dL    Comment: Glucose  reference range applies only to samples taken after fasting for at least 8 hours.  Vitamin B12     Status: None   Collection Time: 01/04/21  7:15 PM  Result Value Ref Range   Vitamin B-12 364 180 - 914 pg/mL    Comment: (NOTE) This assay is not validated for testing neonatal or myeloproliferative syndrome specimens for Vitamin B12 levels. Performed at [facility], [place] [address]  [place], [place] [address]   HIV Antibody (routine testing w rflx)     Status: None   Collection Time: 01/04/21  7:15 PM  Result Value Ref Range   HIV Screen 4th Generation wRfx Non Reactive Non Reactive    Comment: Performed at [facility], [employer] [address] [place], [place] [address]  Hemoglobin and hematocrit, blood (STAT)     Status: Abnormal   Collection Time: 01/04/21  7:15 PM  Result Value Ref Range   Hemoglobin 11.6 (L) 12.0 - 15.0 g/dL   HCT 33.2 (L) 36.0 - 46.0 %    Comment: Performed at [facility], [address] [place], [place] [address]  DIC Panel ONCE - STAT     Status: Abnormal   Collection Time: 01/04/21  7:15 PM  Result Value Ref Range   Prothrombin Time 14.6 11.4 - 15.2 seconds   INR 1.1 0.8 - 1.2    Comment: (NOTE) INR goal varies based on device and disease states.    aPTT 27 24 - 36 seconds   Fibrinogen 364 210 - 475 mg/dL    Comment: (NOTE) Fibrinogen results may be underestimated in patients receiving thrombolytic therapy.    D-Dimer, Quant 0.61 (H) 0.00 - 0.50 ug/mL-FEU    Comment: (NOTE) At the manufacturer cut-off value of 0.5 g/mL FEU, this assay has a negative predictive value of 95-100%.This assay is intended for use in conjunction with a clinical pretest probability (PTP) assessment model to exclude pulmonary embolism (PE) and deep venous thrombosis (DVT) in outpatients suspected of PE or DVT. Results should be correlated with clinical presentation.    Platelets 406 (H) 150 - 400 K/uL   Smear Review MIXED RBC POPULATION     Comment:  PLATELETS APPEAR INCREASED MORPHOLOGY UNREMARKABLE Performed at [facility], [address] [place], [place] [address]   Urinalysis, Complete w Microscopic     Status: Abnormal   Collection Time: 01/04/21  8:50 PM  Result Value Ref Range   Color, Urine YELLOW (A) YELLOW   APPearance CLEAR (A) CLEAR   Specific Gravity, Urine >1.046 (H) 1.005 - 1.030   pH 5.0 5.0 - 8.0   Glucose, UA NEGATIVE NEGATIVE mg/dL   Hgb urine dipstick LARGE (A) NEGATIVE   Bilirubin Urine NEGATIVE NEGATIVE   Ketones, ur NEGATIVE NEGATIVE mg/dL   Protein, ur 30 (A) NEGATIVE mg/dL   Nitrite NEGATIVE NEGATIVE   Leukocytes,Ua LARGE (A) NEGATIVE   RBC / HPF 0-5 0 - 5 RBC/hpf   WBC, UA 6-10 0 - 5 WBC/hpf   Bacteria, UA RARE (A) NONE SEEN   Squamous Epithelial / LPF 0-5 0 - 5    Comment: Performed at [facility], [address], [place], [place] [address]  Urine Drug Screen, Qualitative ([facility] only)     Status: None   Collection Time: 01/04/21  8:50 PM  Result Value Ref Range   Tricyclic, Ur Screen NONE DETECTED NONE DETECTED   Amphetamines, Ur Screen NONE DETECTED NONE DETECTED   MDMA (Ecstasy)Ur Screen NONE DETECTED NONE DETECTED   Cocaine Metabolite,Ur [place] NONE DETECTED NONE DETECTED   Opiate, Ur Screen NONE DETECTED NONE DETECTED   Phencyclidine (PCP) Ur S NONE DETECTED NONE DETECTED   Cannabinoid 50 Ng, Ur [place] NONE DETECTED NONE DETECTED   Barbiturates, Ur Screen NONE DETECTED NONE DETECTED   Benzodiazepine, Ur Scrn NONE DETECTED NONE DETECTED   Methadone Scn, Ur NONE DETECTED NONE DETECTED    Comment: (NOTE) Tricyclics + metabolites, urine  Cutoff 1000 ng/mL Amphetamines + metabolites, urine  Cutoff 1000 ng/mL MDMA (Ecstasy), urine              Cutoff 500 ng/mL Cocaine Metabolite, urine          Cutoff 300 ng/mL Opiate + metabolites, urine        Cutoff 300 ng/mL Phencyclidine (PCP), urine         Cutoff 25 ng/mL Cannabinoid, urine                 Cutoff 50 ng/mL Barbiturates +  metabolites, urine  Cutoff 200 ng/mL Benzodiazepine, urine              Cutoff 200 ng/mL Methadone, urine                   Cutoff 300 ng/mL  The urine drug screen provides only a preliminary, unconfirmed analytical test result and should not be used for non-medical purposes. Clinical consideration and professional judgment should be applied to any positive drug screen result due to possible interfering substances. A more specific alternate chemical method must be used in order to obtain a confirmed analytical result. Gas chromatography / mass spectrometry (GC/MS) is the preferred confirm atory method. Performed at [facility], [address] [place], [place] [address]   Hemoglobin and hematocrit, blood (STAT)     Status: Abnormal   Collection Time: 01/05/21  1:32 AM  Result Value Ref Range   Hemoglobin 11.8 (L) 12.0 - 15.0 g/dL   HCT 33.7 (L) 36.0 - 46.0 %    Comment: Performed at [facility], [address] [place], [place] [address]  Procalcitonin - Baseline     Status: None   Collection Time: 01/05/21  5:38 AM  Result Value Ref Range   Procalcitonin 10.66 ng/mL    Comment:        Interpretation: PCT >= 10 ng/mL: Important systemic inflammatory response, almost exclusively due to severe bacterial sepsis or septic shock. (NOTE)       Sepsis PCT Algorithm           Lower Respiratory Tract                                      Infection PCT Algorithm    ----------------------------     ----------------------------         PCT < 0.25 ng/mL                PCT < 0.10 ng/mL          Strongly encourage             Strongly discourage   discontinuation of antibiotics    initiation of antibiotics    ----------------------------     -----------------------------       PCT 0.25 - 0.50 ng/mL            PCT 0.10 - 0.25 ng/mL               OR       >80% decrease in PCT            Discourage initiation of  antibiotics       Encourage discontinuation           of antibiotics    ----------------------------     -----------------------------         PCT >= 0.50 ng/mL              PCT 0.26 - 0.50 ng/mL                AND       <80% decrease in PCT             Encourage initiation of                                             antibiotics       Encourage continuation           of antibiotics    ----------------------------     -----------------------------        PCT >= 0.50 ng/mL                  PCT > 0.50 ng/mL               AND         increase in PCT                  Strongly encourage                                      initiation of antibiotics    Strongly encourage escalation           of antibiotics                                     -----------------------------                                           PCT <= 0.25 ng/mL                                                 OR                                        > 80% decrease in PCT                                      Discontinue / Do not initiate                                             antibiotics  Performed at [facility], [address], [place], [place] [address]   Folate     Status: None   Collection Time: 01/05/21  5:38 AM  Result Value Ref Range   Folate 8.0 >5.9 ng/mL    Comment: Performed at [facility], [address] [place], [place] [address]  Iron and TIBC     Status: Abnormal   Collection Time: 01/05/21  5:38 AM  Result Value Ref Range   Iron 31 28 - 170 ug/dL   TIBC 322 250 - 450 ug/dL   Saturation Ratios 10 (L) 10.4 - 31.8 %   UIBC 291 ug/dL    Comment: Performed at [facility], [address] [place], [place] [address]  Ferritin     Status: None   Collection Time: 01/05/21  5:38 AM  Result Value Ref Range   Ferritin 28 11 - 307 ng/mL    Comment: Performed at [facility], [address] [place], [place] [address]  CBC     Status: Abnormal   Collection Time: 01/05/21   5:40 AM  Result Value Ref Range   WBC 24.2 (H) 4.0 - 10.5 K/uL   RBC 4.60 3.87 - 5.11 MIL/uL   Hemoglobin 11.8 (L) 12.0 - 15.0 g/dL   HCT 34.7 (L) 36.0 - 46.0 %   MCV 75.4 (L) 80.0 - 100.0 fL    Comment: REPEATED TO VERIFY   MCH 25.7 (L) 26.0 - 34.0 pg   MCHC 34.0 30.0 - 36.0 g/dL   RDW 21.8 (H) 11.5 - 15.5 %   Platelets 372 150 - 400 K/uL   nRBC 1.7 (H) 0.0 - 0.2 %    Comment: Performed at [facility], [address] [place], [place] [address]  Basic metabolic panel     Status: Abnormal   Collection Time: 01/05/21  5:40 AM  Result Value Ref Range   Sodium 138 135 - 145 mmol/L   Potassium 4.1 3.5 - 5.1 mmol/L   Chloride 107 98 - 111 mmol/L   CO2 21 (L) 22 - 32 mmol/L   Glucose, Bld 122 (H) 70 - 99 mg/dL    Comment: Glucose reference range applies only to samples taken after fasting for at least 8 hours.   BUN 21 (H) 6 - 20 mg/dL   Creatinine, Ser 0.75 0.44 - 1.00 mg/dL   Calcium 8.1 (L) 8.9 - 10.3 mg/dL   GFR, Estimated >60 >60 mL/min    Comment: (NOTE) Calculated using the CKD-EPI Creatinine Equation (2021)    Anion gap 10 5 - 15    Comment: Performed at [facility], [address] [place], [place] [address]  Magnesium     Status: None   Collection Time: 01/05/21  5:40 AM  Result Value Ref Range   Magnesium 2.0 1.7 - 2.4 mg/dL    Comment: Performed at [facility], [address], [place], [place] [address]  Phosphorus     Status: None   Collection Time: 01/05/21  5:40 AM  Result Value Ref Range   Phosphorus 2.9 2.5 - 4.6 mg/dL    Comment: Performed at [facility], [address] [place], [place] [address]  Protime-INR     Status: None   Collection Time: 01/05/21  5:40 AM  Result Value Ref Range   Prothrombin Time 14.0 11.4 - 15.2 seconds   INR 1.1 0.8 - 1.2    Comment: (NOTE) INR goal varies based on device and disease states. Performed at [facility], [address], [place], [place] [address]   Lipid  panel     Status: Abnormal   Collection Time:  01/05/21  5:40 AM  Result Value Ref Range   Cholesterol 105 0 - 200 mg/dL   Triglycerides 223 (H) <150 mg/dL   HDL 15 (L) >40 mg/dL   Total CHOL/HDL Ratio 7.0 RATIO   VLDL 45 (H) 0 - 40 mg/dL   LDL Cholesterol 45 0 - 99 mg/dL    Comment:        Total Cholesterol/HDL:CHD Risk Coronary Heart Disease Risk Table                     Men   Women  1/2 Average Risk   3.4   3.3  Average Risk       5.0   4.4  2 X Average Risk   9.6   7.1  3 X Average Risk  23.4   11.0        Use the calculated Patient Ratio above and the CHD Risk Table to determine the patient's CHD Risk.        ATP III CLASSIFICATION (LDL):  <100     mg/dL   Optimal  100-129  mg/dL   Near or Above                    Optimal  130-159  mg/dL   Borderline  160-189  mg/dL   High  >190     mg/dL   Very High Performed at [facility], [place], [place] [address]   Lactic acid, plasma     Status: Abnormal   Collection Time: 01/05/21  5:40 AM  Result Value Ref Range   Lactic Acid, Venous 2.1 (HH) 0.5 - 1.9 mmol/L    Comment: CRITICAL VALUE NOTED. VALUE IS CONSISTENT WITH PREVIOUSLY REPORTED/CALLED VALUE HNM Performed at [facility], [address] [place], [place] [address]   Reticulocytes     Status: Abnormal   Collection Time: 01/05/21  2:17 PM  Result Value Ref Range   Retic Ct Pct 2.0 0.4 - 3.1 %   RBC. 4.42 3.87 - 5.11 MIL/uL   Retic Count, Absolute 90.8 19.0 - 186.0 K/uL   Immature Retic Fract 36.5 (H) 2.3 - 15.9 %    Comment: Performed at [facility], [address] [place], [place] [address]   No components found for: ESR, C REACTIVE PROTEIN MICRO: Recent Results (from the past 720 hour(s))  Resp Panel by RT-PCR (Flu A&B, Covid) Nasopharyngeal Swab     Status: None   Collection Time: 01/04/21 12:05 PM   Specimen: Nasopharyngeal Swab; Nasopharyngeal(NP) swabs in vial transport medium  Result Value Ref Range  Status   SARS Coronavirus 2 by RT PCR NEGATIVE NEGATIVE Final    Comment: (NOTE) SARS-CoV-2 target nucleic acids are NOT DETECTED.  The SARS-CoV-2 RNA is generally detectable in upper respiratory specimens during the acute phase of infection. The lowest concentration of SARS-CoV-2 viral copies this assay can detect is 138 copies/mL. A negative result does not preclude SARS-Cov-2 infection and should not be used as the sole basis for treatment or other patient management decisions. A negative result may occur with  improper specimen collection/handling, submission of specimen other than nasopharyngeal swab, presence of viral mutation(s) within the areas targeted by this assay, and inadequate number of viral copies(<138 copies/mL). A negative result must be combined with clinical observations, patient history, and epidemiological information. The expected result is Negative.  Fact Sheet for Patients:  EntrepreneurPulse.com.au  Fact Sheet for Healthcare Providers:  IncredibleEmployment.be  This test is no t yet approved or cleared by the [place] and  has been authorized for detection and/or diagnosis of SARS-CoV-2 by FDA under an Emergency Use Authorization (EUA). This EUA will remain  in effect (meaning this test can be used) for the duration of the COVID-19 declaration under Section 564(b)(1) of the Act, 21 U.S.C.section 360bbb-3(b)(1), unless the authorization is terminated  or revoked sooner.       Influenza A by PCR NEGATIVE NEGATIVE Final   Influenza B by PCR NEGATIVE NEGATIVE Final    Comment: (NOTE) The Xpert Xpress SARS-CoV-2/FLU/RSV plus assay is intended as an aid in the diagnosis of influenza from Nasopharyngeal swab specimens and should not be used as a sole basis for treatment. Nasal washings and aspirates are unacceptable for Xpert Xpress SARS-CoV-2/FLU/RSV testing.  Fact Sheet for  Patients: EntrepreneurPulse.com.au  Fact Sheet for Healthcare Providers: IncredibleEmployment.be  This test is not yet approved or cleared by the [place] FDA and has been authorized for detection and/or diagnosis of SARS-CoV-2 by FDA under an Emergency Use Authorization (EUA). This EUA will remain in effect (meaning this test can be used) for the duration of the COVID-19 declaration under Section 564(b)(1) of the Act, 21 U.S.C. section 360bbb-3(b)(1), unless the authorization is terminated or revoked.  Performed at [facility], [address] [place], [place] [address]   Blood culture (routine single)     Status: None (Preliminary result)   Collection Time: 01/04/21  2:58 PM   Specimen: BLOOD  Result Value Ref Range Status   Specimen Description BLOOD LEFT ANTECUBITAL  Final   Special Requests   Final    BOTTLES DRAWN AEROBIC AND ANAEROBIC Blood Culture adequate volume   Culture  Setup Time   Final    YEAST ANAEROBIC BOTTLE ONLY Organism ID to follow CRITICAL RESULT CALLED TO, READ BACK BY AND VERIFIED WITH: Coralie Carpen 01/05/21 1507 SLM Performed at [facility], [address] [place], [place] [address]    Culture YEAST  Final   Report Status PENDING  Incomplete  Blood Culture ID Panel (Reflexed)     Status: Abnormal   Collection Time: 01/04/21  2:58 PM  Result Value Ref Range Status   Enterococcus faecalis NOT DETECTED NOT DETECTED Final   Enterococcus Faecium NOT DETECTED NOT DETECTED Final   Listeria monocytogenes NOT DETECTED NOT DETECTED Final   Staphylococcus species NOT DETECTED NOT DETECTED Final   Staphylococcus aureus (BCID) NOT DETECTED NOT DETECTED Final   Staphylococcus epidermidis NOT DETECTED NOT DETECTED Final   Staphylococcus lugdunensis NOT DETECTED NOT DETECTED Final   Streptococcus species NOT DETECTED NOT DETECTED Final   Streptococcus agalactiae NOT DETECTED NOT DETECTED Final    Streptococcus pneumoniae NOT DETECTED NOT DETECTED Final   Streptococcus pyogenes NOT DETECTED NOT DETECTED Final   A.calcoaceticus-baumannii NOT DETECTED NOT DETECTED Final   Bacteroides fragilis NOT DETECTED NOT DETECTED Final   Enterobacterales NOT DETECTED NOT DETECTED Final   Enterobacter cloacae complex NOT DETECTED NOT DETECTED Final   Escherichia coli NOT DETECTED NOT DETECTED Final   Klebsiella aerogenes NOT DETECTED NOT DETECTED Final   Klebsiella oxytoca NOT DETECTED NOT DETECTED Final   Klebsiella pneumoniae NOT DETECTED NOT DETECTED Final   Proteus species NOT DETECTED NOT DETECTED Final   Salmonella species NOT DETECTED NOT DETECTED Final   Serratia marcescens NOT DETECTED NOT DETECTED Final   Haemophilus influenzae NOT DETECTED NOT DETECTED Final   Neisseria meningitidis NOT DETECTED NOT DETECTED Final  Pseudomonas aeruginosa NOT DETECTED NOT DETECTED Final   Stenotrophomonas maltophilia NOT DETECTED NOT DETECTED Final   Candida albicans NOT DETECTED NOT DETECTED Final   Candida auris NOT DETECTED NOT DETECTED Final   Candida glabrata NOT DETECTED NOT DETECTED Final   Candida krusei DETECTED (A) NOT DETECTED Final    Comment: CRITICAL RESULT CALLED TO, READ BACK BY AND VERIFIED WITH: SAMANTHA RAUER 01/05/21 1507 SLM    Candida parapsilosis NOT DETECTED NOT DETECTED Final   Candida tropicalis NOT DETECTED NOT DETECTED Final   Cryptococcus neoformans/gattii NOT DETECTED NOT DETECTED Final    Comment: Performed at [facility], [address] [place], [place] [address]  MRSA Next Gen by PCR, Nasal     Status: None   Collection Time: 01/04/21  4:58 PM   Specimen: Nasal Mucosa; Nasal Swab  Result Value Ref Range Status   MRSA by PCR Next Gen NOT DETECTED NOT DETECTED Final    Comment: (NOTE) The GeneXpert MRSA Assay (FDA approved for NASAL specimens only), is one component of a comprehensive MRSA colonization surveillance program. It is not intended to  diagnose MRSA infection nor to guide or monitor treatment for MRSA infections. Test performance is not FDA approved in patients less than 59 years old. Performed at [facility], [address] [place], [place] [address]     IMAGING: [place] Skull 1-3 Views  Result Date: 01/04/2021 CLINICAL DATA:  Evaluation for shunt EXAM: SKULL - 1-3 VIEW COMPARISON:  01/04/2021 CT head. FINDINGS: There is no evidence of skull fracture or other focal bone lesions. Right frontal approach ventriculostomy catheter, with shunt set at 110 mm H2O. No evidence of shunt discontinuity or kinking. IMPRESSION: Ventriculostomy catheter, with shunt set 110 mm of H2O, without evidence of shunt discontinuity or kinking. Electronically Signed   By: Merilyn Baba M.D.   On: 01/04/2021 18:47   CT HEAD WO CONTRAST (5MM)  Result Date: 01/04/2021 CLINICAL DATA:  Neck trauma, dangerous injury mechanism (Age 40-64y); Head trauma, focal neuro findings (Age 29-64y) EXAM: CT HEAD WITHOUT CONTRAST CT CERVICAL SPINE WITHOUT CONTRAST TECHNIQUE: Multidetector CT imaging of the head and cervical spine was performed following the standard protocol without intravenous contrast. Multiplanar CT image reconstructions of the cervical spine were also generated. COMPARISON:  Head CT 10/12/2019, cervical spine CT 11/10/2012. FINDINGS: CT HEAD FINDINGS Brain: There is a new hypodense right subdural collection, without visible acute blood products.There is adjacent loss of gray-white matter attenuation in the right parietal lobe, concerning for ischemia.There is a right frontal approach ventriculostomy catheter with tip unchanged in position in the midline between the frontal horns. Vascular: No hyperdense vessel or unexpected calcification. Skull: Right frontal burr hole. Prior suboccipital craniotomy. No acute fracture. Sinuses/Orbits: Paranasal sinuses are predominantly clear. Other: None. CT CERVICAL SPINE FINDINGS Alignment: Unchanged Skull  base and vertebrae: No acute fracture. Soft tissues and spinal canal: VP shunt catheter courses down the right neck. No prevertebral soft tissue swelling. No visible canal hematoma. Disc levels: Enlargement of the spinal canal throughout the cervical spine. 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 Unchanged tubing within the spinal canal in comparison to prior exam in 2014. Upper chest: Negative. Other: None. IMPRESSION: Head CT: New hypodense right subdural collection measuring 0.9 cm, without evidence of acute blood products. Underlying loss of gray-white matter differentiation in the right parietal lobe, concerning for acute-subacute infarct. Recommend MRI. Cervical spine CT: No acute cervical spine fracture. Unchanged chronic congenital and postoperative changes. Critical  Value/emergent results were called by telephone at the time of interpretation on 01/04/2021 at 2:43 pm to provider [facility] , who verbally acknowledged these results. Electronically Signed   By: Maurine Simmering M.D.   On: 01/04/2021 14:48   CT Cervical Spine Wo Contrast  Result Date: 01/04/2021 CLINICAL DATA:  Neck trauma, dangerous injury mechanism (Age 26-64y); Head trauma, focal neuro findings (Age 62-64y) EXAM: CT HEAD WITHOUT CONTRAST CT CERVICAL SPINE WITHOUT CONTRAST TECHNIQUE: Multidetector CT imaging of the head and cervical spine was performed following the standard protocol without intravenous contrast. Multiplanar CT image reconstructions of the cervical spine were also generated. COMPARISON:  Head CT 10/12/2019, cervical spine CT 11/10/2012. FINDINGS: CT HEAD FINDINGS Brain: There is a new hypodense right subdural collection, without visible acute blood products.There is adjacent loss of gray-white matter attenuation in the right parietal lobe, concerning for ischemia.There is a right frontal approach ventriculostomy catheter with tip unchanged in position in the midline between the frontal horns.  Vascular: No hyperdense vessel or unexpected calcification. Skull: Right frontal burr hole. Prior suboccipital craniotomy. No acute fracture. Sinuses/Orbits: Paranasal sinuses are predominantly clear. Other: None. CT CERVICAL SPINE FINDINGS Alignment: Unchanged Skull base and vertebrae: No acute fracture. Soft tissues and spinal canal: VP shunt catheter courses down the right neck. No prevertebral soft tissue swelling. No visible canal hematoma. Disc levels: Enlargement of the spinal canal throughout the cervical spine. 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 Unchanged tubing within the spinal canal in comparison to prior exam in 2014. Upper chest: Negative. Other: None. IMPRESSION: Head CT: New hypodense right subdural collection measuring 0.9 cm, without evidence of acute blood products. Underlying loss of gray-white matter differentiation in the right parietal lobe, concerning for acute-subacute infarct. Recommend MRI. Cervical spine CT: No acute cervical spine fracture. Unchanged chronic congenital and postoperative changes. Critical Value/emergent results were called by telephone at the time of interpretation on 01/04/2021 at 2:43 pm to provider [facility] , who verbally acknowledged these results. Electronically Signed   By: Maurine Simmering M.D.   On: 01/04/2021 14:48   CT CHEST ABDOMEN PELVIS W CONTRAST  Result Date: 01/04/2021 CLINICAL DATA:  recurrent falls, VP shunt present EXAM: CT CHEST, ABDOMEN, AND PELVIS WITH CONTRAST TECHNIQUE: Multidetector CT imaging of the chest, abdomen and pelvis was performed following the standard protocol during bolus administration of intravenous contrast. CONTRAST:  31m OMNIPAQUE IOHEXOL 350 MG/ML SOLN COMPARISON:  None. FINDINGS: CT CHEST FINDINGS Cardiovascular: Normal cardiac size.No pericardial disease.Normal size main and branch pulmonary arteries.The thoracic aorta is unremarkable. Mediastinum/Nodes: No lymphadenopathy.The thyroid is  unremarkable.Esophagus is unremarkable.The trachea is unremarkable. Lungs/Pleura: No focal airspace consolidation.No suspicious pulmonary nodules or masses.No pleural effusion.No pneumothorax. 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 Musculoskeletal: No acute osseous abnormality.No suspicious lytic or blastic lesions. Congenital enlarged spinal canal. CT ABDOMEN PELVIS FINDINGS Hepatobiliary: No hepatic injury or perihepatic hematoma. No gallstones, gallbladder wall thickening, or biliary dilatation. Pancreas: Unremarkable. No pancreatic ductal dilatation or surrounding inflammatory changes. Spleen: Focal peripheral somewhat wedge-shaped hypodensity in the upper posterior aspect of the spleen, morphology favoring small cyst, hemangioma, or possibly infarct (series 2, image 47) rather than laceration. Adrenals/Urinary Tract: No adrenal hemorrhage or renal injury identified. Bladder is unremarkable. No hydronephrosis or nephrolithiasis. No renal laceration. Subcentimeter hypodense right renal lesion, likely cyst. The bladder is minimally distended but unremarkable. Stomach/Bowel: The stomach is within normal limits. There is no evidence of bowel obstruction. No evidence of appendicitis. Vascular/Lymphatic: Diffuse atherosclerotic  calcified and noncalcified plaque throughout the abdominal aorta, which appears diminutive distally. Reproductive: Unremarkable. Other: No abdominal wall hernia or abnormality. No abdominopelvic ascites. VP shunt tubing terminates in the pelvis. Musculoskeletal: No acute osseous abnormality. Mild bilateral hip osteoarthritis. Diffuse muscle atrophy. Congenital enlargement of the spinal canal. IMPRESSION: No evidence of acute trauma in the chest. Focal peripheral hypodensity in the upper posterior aspect of the spleen, morphology favoring small cyst, hemangioma, or possibly splenic infarct, and felt unlikely to be acute trauma. No other acute findings in the abdomen or pelvis.  Electronically Signed   By: Maurine Simmering M.D.   On: 01/04/2021 14:56   CT T-SPINE NO CHARGE  Result Date: 01/04/2021 CLINICAL DATA:  Fall EXAM: CT THORACIC SPINE WITHOUT CONTRAST TECHNIQUE: Multidetector CT images of the thoracic were obtained using the standard protocol without intravenous contrast. COMPARISON:  None. FINDINGS: Alignment: Normal. Vertebrae: No acute fracture or focal pathologic process. Paraspinal and other soft tissues: Reported separately on chest CT. Disc levels: Preserved disc heights. Chronic enlargement of the spinal canal. IMPRESSION: No acute thoracic spine fracture. Electronically Signed   By: Maurine Simmering M.D.   On: 01/04/2021 14:18   CT L-SPINE NO CHARGE  Result Date: 01/04/2021 CLINICAL DATA:  Fall EXAM: CT LUMBAR SPINE WITHOUT CONTRAST TECHNIQUE: Multidetector CT imaging of the lumbar spine was performed without intravenous contrast administration. Multiplanar CT image reconstructions were also generated. COMPARISON:  None. FINDINGS: Segmentation: 5 lumbar type vertebrae. Alignment: Normal Vertebrae: No acute fracture or focal pathologic process. Paraspinal and other soft tissues: Reported separately on CT abdomen and pelvis Disc levels: No visible impingement. IMPRESSION: No acute lumbar spine fracture. Electronically Signed   By: Maurine Simmering M.D.   On: 01/04/2021 14:13   DG Chest Portable 1 View  Result Date: 01/04/2021 CLINICAL DATA:  Weakness, falls EXAM: PORTABLE CHEST 1 VIEW COMPARISON:  Chest radiograph 02/11/2013 FINDINGS: The patient tubing is seen over the right hemithorax. A focus of calcification along the right aspect of the shunt projecting over the right apex is unchanged. The cardiomediastinal silhouette is normal. There is no focal consolidation or pulmonary edema. There is no pleural effusion or pneumothorax. Slight levocurvature of the upper thoracic spine is noted. There is no acute osseous abnormality. IMPRESSION: No radiographic evidence of acute  cardiopulmonary process. Electronically Signed   By: Valetta Mole M.D.   On: 01/04/2021 12:51   ECHOCARDIOGRAM COMPLETE  Result Date: 01/05/2021    ECHOCARDIOGRAM REPORT   Patient Name:   MYLES MALLICOAT Date of Exam: 01/05/2021 Medical Rec #:  [id number]        Height:       60.0 in Accession #:    [id number]       Weight:       106.9 lb Date of Birth:  [birth year]       BSA:          1.430 m Patient Age:    15 years         BP:           157/111 mmHg Patient Gender: F                HR:           97 bpm. Exam Location:  [facility] Procedure: 2D Echo, Color Doppler and Cardiac Doppler Indications:     I63.9 Stroke  History:         Patient has no prior history of Echocardiogram examinations.  Risk Factors:Hypertension.  Sonographer:     Charmayne Sheer Referring Phys:  [phone] MCNEILL P KIRKPATRICK Diagnosing Phys: Nelva Bush MD  Sonographer Comments: Suboptimal subcostal window. IMPRESSIONS  1. Left ventricular ejection fraction, by estimation, is 55 to 60%. The left ventricle has normal function. The left ventricle has no regional wall motion abnormalities. Left ventricular diastolic parameters are consistent with Grade I diastolic dysfunction (impaired relaxation). Elevated left atrial pressure.  2. Right ventricular systolic function is normal. The right ventricular size is normal.  3. The mitral valve is abnormal. Cannot exclude prolapse of posterior leaflet or cleft. There is at least mild to moderate mitral valve regurgitation. No evidence of mitral stenosis.  4. The aortic valve is tricuspid. Aortic valve regurgitation is not visualized. No aortic stenosis is present.  5. The inferior vena cava is normal in size with <50% respiratory variability, suggesting right atrial pressure of 8 mmHg. FINDINGS  Left Ventricle: Left ventricular ejection fraction, by estimation, is 55 to 60%. The left ventricle has normal function. The left ventricle has no regional wall motion abnormalities. The left  ventricular internal cavity size was normal in size. There is  no left ventricular hypertrophy. Left ventricular diastolic parameters are consistent with Grade I diastolic dysfunction (impaired relaxation). Elevated left atrial pressure. Right Ventricle: The right ventricular size is normal. No increase in right ventricular wall thickness. Right ventricular systolic function is normal. Left Atrium: Left atrial size was normal in size. Right Atrium: Right atrial size was normal in size. Pericardium: There is no evidence of pericardial effusion. Presence of pericardial fat pad. Mitral Valve: The mitral valve is abnormal. Mild to moderate mitral valve regurgitation. No evidence of mitral valve stenosis. MV peak gradient, 6.0 mmHg. The mean mitral valve gradient is 3.0 mmHg. Tricuspid Valve: The tricuspid valve is not well visualized. Tricuspid valve regurgitation is trivial. Aortic Valve: The aortic valve is tricuspid. Aortic valve regurgitation is not visualized. No aortic stenosis is present. Aortic valve mean gradient measures 6.0 mmHg. Aortic valve peak gradient measures 11.3 mmHg. Aortic valve area, by VTI measures 2.04  cm. Pulmonic Valve: The pulmonic valve was not well visualized. Pulmonic valve regurgitation is trivial. No evidence of pulmonic stenosis. Aorta: The aortic root and ascending aorta are structurally normal, with no evidence of dilitation. Pulmonary Artery: The pulmonary artery is not well seen. Venous: The inferior vena cava is normal in size with less than 50% respiratory variability, suggesting right atrial pressure of 8 mmHg. IAS/Shunts: The interatrial septum was not well visualized.  LEFT VENTRICLE PLAX 2D LVIDd:         4.40 cm   Diastology LVIDs:         3.16 cm   LV e' medial:    6.31 cm/s LV PW:         0.85 cm   LV E/e' medial:  16.5 LV IVS:        0.66 cm   LV e' lateral:   7.83 cm/s LVOT diam:     1.80 cm   LV E/e' lateral: 13.3 LV SV:         63 LV SV Index:   44 LVOT Area:     2.54  cm  RIGHT VENTRICLE RV Basal diam:  2.72 cm RV S prime:     15.60 cm/s LEFT ATRIUM             Index        RIGHT ATRIUM  Index LA diam:        2.80 cm 1.96 cm/m   RA Area:     8.05 cm LA Vol (A2C):   26.3 ml 18.39 ml/m  RA Volume:   16.00 ml 11.19 ml/m LA Vol (A4C):   32.9 ml 23.00 ml/m LA Biplane Vol: 29.9 ml 20.90 ml/m  AORTIC VALVE                     PULMONIC VALVE AV Area (Vmax):    1.95 cm      PV Vmax:       1.16 m/s AV Area (Vmean):   1.94 cm      PV Vmean:      74.500 cm/s AV Area (VTI):     2.04 cm      PV VTI:        0.167 m AV Vmax:           168.00 cm/s   PV Peak grad:  5.4 mmHg AV Vmean:          119.000 cm/s  PV Mean grad:  2.0 mmHg AV VTI:            0.309 m AV Peak Grad:      11.3 mmHg AV Mean Grad:      6.0 mmHg LVOT Vmax:         129.00 cm/s LVOT Vmean:        90.500 cm/s LVOT VTI:          0.248 m LVOT/AV VTI ratio: 0.80  AORTA Ao Root diam: 2.60 cm MITRAL VALVE MV Area (PHT): 6.96 cm     SHUNTS MV Area VTI:   2.99 cm     Systemic VTI:  0.25 m MV Peak grad:  6.0 mmHg     Systemic Diam: 1.80 cm MV Mean grad:  3.0 mmHg MV Vmax:       1.22 m/s MV Vmean:      79.0 cm/s MV Decel Time: 109 msec MV E velocity: 104.00 cm/s MV A velocity: 124.00 cm/s MV E/A ratio:  0.84 Christopher End MD Electronically signed by Nelva Bush MD Signature Date/Time: 01/05/2021/10:54:03 AM    Final     Assessment:   Carolyn Herring is a 45 y.o. 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 Admitted with falls, weakness and found to have profound anemia. Had large ulcer from NSAIDS noted on EGD. Initially WBC 30 but no fevers. BCX neg initially but now growing Candida krusei. She seems to be back to her baseline mentally. She has CT with possible subacute infarct and possible small SDH.  She denies any recent shunt issues. Unclear if she is neurologically at her baseline.  MRI is pending.   The  candidemia source is most likely the gastric ulcer and translocation from the gut.  Need to eval for endocarditis as not clear how long could have been candidemic.  TTE shows abnormality on MV.  I am not convinced the shunt is infected as no peritonitis, no obvious change in her mental status however the only way to rule it out would be to tap shunt.  Recommendations Continue unasyn and eraxis. Repeat bcx ordered.  MRI is pending. After MRI would suggest tap shunt and send for cell count, diff, protein, glucose, routine and fungal cultue. Will need TEE given abnormality on TTE. Would consult ophtho  in AM for detailed retinal exam given candidemia and her recent vision complaints.  Thank you very much for allowing me to participate in the care of this patient. Please call with questions.   Cheral Marker. Ola Spurr, MD

## 2021-01-05 NOTE — Transfer of Care (Signed)
Immediate Anesthesia Transfer of Care Note  Patient: Carolyn Herring  Procedure(s) Performed: ESOPHAGOGASTRODUODENOSCOPY (EGD) WITH PROPOFOL  Patient Location: PACU  Anesthesia Type:General  Level of Consciousness: awake  Airway & Oxygen Therapy: Patient Spontanous Breathing and Patient connected to nasal cannula oxygen  Post-op Assessment: Report given to RN and Post -op Vital signs reviewed and stable  Post vital signs: Reviewed and stable  Last Vitals:  Vitals Value Taken Time  BP    Temp    Pulse    Resp    SpO2      Last Pain:  Vitals:   01/05/21 1319  TempSrc:   PainSc: 4          Complications: No notable events documented.

## 2021-01-05 NOTE — Evaluation (Signed)
Occupational Therapy Evaluation Patient Details Name: Carolyn Herring MRN: [id number] DOB: [birth year] Today's Date: 01/05/2021   History of Present Illness Pt is a 45 y.o. F arriving to ED after a fall with decreased responsiveness and hx of abdominal pain and diarrhea. Upon arrival pt was found to have severe GI bleed. 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   Clinical Impression    Patient presenting with decreased Ind in self care, balance, functional mobility/transfers, endurance, and safety awareness. Patient reports she lives at home and independently without use of AD PTA. No family to confirm baseline and pt providing inconsistent information from earlier PT assessment. Pt reports she cooks her meals yet has a very difficulty time squeezing therapist hand and fully extending digits. When asked how she opens packages while cooking she does not give an answer. Patient needing min A from bed level with BP of 165/122. Pt refused OOB tasks secondary to fatigue and abdominal pain.  Patient will benefit from acute OT to increase overall independence in the areas of ADLs, functional mobility, and safety awareness in order to safely discharge to next venue of care.   Recommendations for follow up therapy are one component of a multi-disciplinary discharge planning process, led by the attending physician.  Recommendations may be updated based on patient status, additional functional criteria and insurance authorization.   Follow Up Recommendations  SNF    Equipment Recommendations  Other (comment) (defer to next venue of care)       Precautions / Restrictions Precautions Precautions: Fall Restrictions Weight Bearing Restrictions: No      Mobility Bed Mobility Overal bed mobility: Needs Assistance Bed Mobility: Rolling Rolling: Min assist   Supine to sit: Min assist;HOB  elevated     General bed mobility comments: Slight assist trunk support, pt prefers to utilize bed rails but looping wrist through to pull toward EOB    Transfers Overall transfer level: Needs assistance Equipment used: Rolling walker (2 wheeled) Transfers: Sit to/from Stand Sit to Stand: Min guard;From elevated surface         General transfer comment: Pt refusal    Balance Overall balance assessment: Needs assistance Sitting-balance support: Bilateral upper extremity supported;Feet unsupported Sitting balance-Leahy Scale: Poor Sitting balance - Comments: requires at least single UE support for balance   Standing balance support: No upper extremity supported Standing balance-Leahy Scale: Fair Standing balance comment: Min-G for standing and stepping forward, could not tolerate moderate challenge                           ADL either performed or assessed with clinical judgement   ADL Overall ADL's : Needs assistance/impaired Eating/Feeding: Set up Eating/Feeding Details (indicate cue type and reason): assistance to open packages/containers Grooming: Wash/dry hands;Wash/dry face;Set up;Bed level                                       Vision Patient Visual Report: No change from baseline Additional Comments: pt reports episodes that affect her vision but could not explain further            Pertinent Vitals/Pain Pain Assessment: Faces Faces Pain Scale: Hurts even more Pain Location: abdomen Pain Descriptors / Indicators: Aching;Sore Pain Intervention(s): Limited activity within patient's tolerance;Monitored during session;Repositioned  Hand Dominance Right   Extremity/Trunk Assessment Upper Extremity Assessment Upper Extremity Assessment: Generalized weakness (hands with significant arthritis)   Lower Extremity Assessment Lower Extremity Assessment: Generalized weakness       Communication Communication Communication: No  difficulties   Cognition Arousal/Alertness: Awake/alert Behavior During Therapy: WFL for tasks assessed/performed Overall Cognitive Status: No family/caregiver present to determine baseline cognitive functioning                                 General Comments: Oriented name, DOB, situation; disoriented to year, place; follows commands consistently but demonstrates decreased STM which pt is aware of and acknowledges (i.e., can't remember if she requies assist for ADLs)              Home Living Family/patient expects to be discharged to:: Private residence Living Arrangements: Alone Available Help at Discharge: Family;Available PRN/intermittently Type of Home: House Home Access: Level entry     Home Layout: One level     Bathroom Shower/Tub: Tub/shower unit         Home Equipment: None   Additional Comments: Aunt resides next door      Prior Functioning/Environment Level of Independence: Needs assistance  Gait / Transfers Assistance Needed: Per pt report, she ambulates without use of AD but does report several falls ADL's / Homemaking Assistance Needed: Pt does not drive and goes to grocery store with aunt. She has non emergency medical transport for appointments. She reports bathing in tub without assistance.   Comments: Pt reports having difficulty with word finding and memory. she gives some conflicting PLOF/home set up information between PT and OT evaluation        OT Problem List: Decreased strength;Decreased activity tolerance;Impaired balance (sitting and/or standing);Decreased coordination      OT Treatment/Interventions: Self-care/ADL training;Therapeutic exercise;Therapeutic activities;Energy conservation;DME and/or AE instruction;Patient/family education;Balance training;Manual therapy;Neuromuscular education    OT Goals(Current goals can be found in the care plan section) Acute Rehab OT Goals Patient Stated Goal: To feel better OT Goal  Formulation: With patient Time For Goal Achievement: 01/05/21 Potential to Achieve Goals: Good  OT Frequency: Min 2X/week   Barriers to D/C:    none known at this time- unable to confirm baseline          AM-PAC OT "6 Clicks" Daily Activity     Outcome Measure Help from another person eating meals?: A Little Help from another person taking care of personal grooming?: A Little Help from another person toileting, which includes using toliet, bedpan, or urinal?: A Lot Help from another person bathing (including washing, rinsing, drying)?: A Little Help from another person to put on and taking off regular upper body clothing?: A Little Help from another person to put on and taking off regular lower body clothing?: A Lot 6 Click Score: 16   End of Session Nurse Communication: Mobility status  Activity Tolerance: Patient limited by fatigue;Patient limited by pain Patient left: in bed;with family/visitor present;with call bell/phone within reach  OT Visit Diagnosis: Unsteadiness on feet (R26.81);Muscle weakness (generalized) (M62.81)                Time: 0630-1601 OT Time Calculation (min): 19 min Charges:  OT General Charges $OT Visit: 1 Visit OT Evaluation $OT Eval Moderate Complexity: 1 Mod OT Treatments $Therapeutic Activity: 8-22 mins  Jackquline Denmark, MS, OTR/L , CBIS ascom [phone]  01/05/21, 4:22 PM

## 2021-01-05 NOTE — Op Note (Signed)
[facility] Patient Name: Carolyn Herring Procedure Date: 01/05/2021 1:11 PM MRN: [id number] Account #: [id number] Date of Birth: [birth year] Admit Type: Inpatient Age: 45 Room: [facility] ENDO ROOM 1 Gender: Female Note Status: Finalized Instrument Name: Upper Endoscope [id number] Procedure:             Upper GI endoscopy Indications:           Melena Providers:             Andrey Farmer MD, MD Referring MD:          No Local Md, MD (Referring MD) Medicines:             Monitored Anesthesia Care Complications:         No immediate complications. Procedure:             Pre-Anesthesia Assessment:                        - Prior to the procedure, a History and Physical was                         performed, and patient medications and allergies were                         reviewed. The patient is competent. The risks and                         benefits of the procedure and the sedation options and                         risks were discussed with the patient. All questions                         were answered and informed consent was obtained.                         Patient identification and proposed procedure were                         verified by the physician, the nurse, the anesthetist                         and the technician in the endoscopy suite. Mental                         Status Examination: alert and oriented. Airway                         Examination: normal oropharyngeal airway and neck                         mobility. Respiratory Examination: clear to                         auscultation. CV Examination: normal. Prophylactic                         Antibiotics: The patient does not require prophylactic  antibiotics. Prior Anticoagulants: The patient has                         taken no previous anticoagulant or antiplatelet                         agents. ASA Grade Assessment: III - A patient with                          severe systemic disease. After reviewing the risks and                         benefits, the patient was deemed in satisfactory                         condition to undergo the procedure. The anesthesia                         plan was to use monitored anesthesia care (MAC).                         Immediately prior to administration of medications,                         the patient was re-assessed for adequacy to receive                         sedatives. The heart rate, respiratory rate, oxygen                         saturations, blood pressure, adequacy of pulmonary                         ventilation, and response to care were monitored                         throughout the procedure. The physical status of the                         patient was re-assessed after the procedure.                        After obtaining informed consent, the endoscope was                         passed under direct vision. Throughout the procedure,                         the patient's blood pressure, pulse, and oxygen                         saturations were monitored continuously. The Endoscope                         was introduced through the mouth, and advanced to the                         second part of duodenum. The upper GI endoscopy was  accomplished without difficulty. The patient tolerated                         the procedure well. Findings:      LA Grade C (one or more mucosal breaks continuous between tops of 2 or       more mucosal folds, less than 75% circumference) esophagitis with no       bleeding was found.      One non-bleeding cratered gastric ulcer with an adherent clot (Forrest       Class IIb) was found on the posterior wall of the gastric antrum. The       lesion was 30 mm in largest dimension and likely bigger as it took up       the majority of the antrum. There was one area with adherent clot that       was lavaged vigourously but  clot did not dislodge. Biopsies not       performed as patient young and has history of grinding up advil plus       motrin into a powder and taking every few hours for months on end.       Biopsies will be done at follow-up endoscopy.      The examined duodenum was normal. Impression:            - LA Grade C reflux esophagitis with no bleeding.                        - Non-bleeding gastric ulcer with an adherent clot                         (Forrest Class IIb).                        - Normal examined duodenum.                        - No specimens collected. Recommendation:        - Return patient to hospital ward for ongoing care.                        - Clear liquid diet.                        - Continue present medications. Given size of ulcer                         and forrest class IIb would recommend IV PPI drip or                         BID for at least 3 days.                        - Repeat upper endoscopy in 3 months to check healing.                        - No ibuprofen, naproxen, or other non-steroidal                         anti-inflammatory drugs. Procedure Code(s):     --- Professional ---  70177, Esophagogastroduodenoscopy, flexible,                         transoral; diagnostic, including collection of                         specimen(s) by brushing or washing, when performed                         (separate procedure) Diagnosis Code(s):     --- Professional ---                        K21.00, Gastro-esophageal reflux disease with                         esophagitis, without bleeding                        K25.4, Chronic or unspecified gastric ulcer with                         hemorrhage                        K92.1, Melena (includes Hematochezia) CPT copyright 2019 American Medical Association. All rights reserved. The codes documented in this report are preliminary and upon coder review may  be revised to meet current compliance  requirements. Andrey Farmer MD, MD 01/05/2021 1:58:46 PM Number of Addenda: 0 Note Initiated On: 01/05/2021 1:11 PM Estimated Blood Loss:  Estimated blood loss: none.      [facility]

## 2021-01-05 NOTE — Anesthesia Postprocedure Evaluation (Signed)
Anesthesia Post Note  Patient: Carolyn Herring  Procedure(s) Performed: ESOPHAGOGASTRODUODENOSCOPY (EGD) WITH PROPOFOL  Patient location during evaluation: Phase II Anesthesia Type: General Level of consciousness: awake and alert, awake and oriented Pain management: pain level controlled Vital Signs Assessment: post-procedure vital signs reviewed and stable Respiratory status: spontaneous breathing, nonlabored ventilation and respiratory function stable Cardiovascular status: blood pressure returned to baseline and stable Postop Assessment: no apparent nausea or vomiting Anesthetic complications: no   No notable events documented.   Last Vitals:  Vitals:   01/05/21 1400 01/05/21 1405  BP: (!) 153/117   Pulse: 87   Resp: 15   Temp:  36.5 C  SpO2: (!) 89% 96%    Last Pain:  Vitals:   01/05/21 1405  TempSrc: Oral  PainSc: 7                  Manfred Arch

## 2021-01-05 NOTE — Progress Notes (Addendum)
SLP Cancellation Note  Patient Details Name: Carolyn Herring MRN: [id number] DOB: 1976-02-18   Cancelled treatment:       Reason Eval/Treat Not Completed:  (chart reviewed; consulted NSG this AM). Pt presented to the ED with severe anemia with a hematocrit of 10.4(received transfusion); s/p Fall hitting her head in the bathroom. Per Neurology, imaging revealed subdural fluid collection with hypodensity in the right parietal region. Possibilities include embolic stroke, healing contusion, watershed infarct due to hypotension/anemia. MRI is desired for further assessment but cannot be completed at this time d/t pt having a Shunt. NSG also reported pt is pending an EGD d/t the anemia at admit; she is NPO currently for procedure, per NSG.  ST services will f/u tomorrow w/ pt's Cognitive-linguistic evaluation post 24 hours admit, and further MD/Neurology assessment of overall status. NSG agreed.     Jerilynn Som, MS, CCC-SLP Speech Language Pathologist Rehab Services [phone] [facility] 01/05/2021, 10:21 AM

## 2021-01-06 ENCOUNTER — Inpatient Hospital Stay: Payer: Medicaid Other

## 2021-01-06 ENCOUNTER — Encounter: Payer: Self-pay | Admitting: Internal Medicine

## 2021-01-06 DIAGNOSIS — A419 Sepsis, unspecified organism: Secondary | ICD-10-CM | POA: Diagnosis not present

## 2021-01-06 DIAGNOSIS — R6520 Severe sepsis without septic shock: Secondary | ICD-10-CM | POA: Diagnosis not present

## 2021-01-06 LAB — BASIC METABOLIC PANEL
Anion gap: 9 (ref 5–15)
BUN: 13 mg/dL (ref 6–20)
CO2: 23 mmol/L (ref 22–32)
Calcium: 8.6 mg/dL — ABNORMAL LOW (ref 8.9–10.3)
Chloride: 109 mmol/L (ref 98–111)
Creatinine, Ser: 0.71 mg/dL (ref 0.44–1.00)
GFR, Estimated: >60 mL/min (ref >60)
Glucose, Bld: 124 mg/dL — ABNORMAL HIGH (ref 70–99)
Potassium: 4.6 mmol/L (ref 3.5–5.1)
Sodium: 141 mmol/L (ref 135–145)

## 2021-01-06 LAB — CBC
HCT: 36.3 % (ref 36.0–46.0)
Hemoglobin: 11.9 g/dL — ABNORMAL LOW (ref 12.0–15.0)
MCH: 26.7 pg (ref 26.0–34.0)
MCHC: 32.8 g/dL (ref 30.0–36.0)
MCV: 81.4 fL (ref 80.0–100.0)
Platelets: 288 10*3/uL (ref 150–400)
RBC: 4.46 MIL/uL (ref 3.87–5.11)
RDW: 23.5 % — ABNORMAL HIGH (ref 11.5–15.5)
WBC: 18.7 10*3/uL — ABNORMAL HIGH (ref 4.0–10.5)
nRBC: 1.9 % — ABNORMAL HIGH (ref 0.0–0.2)

## 2021-01-06 LAB — GASTROINTESTINAL PANEL BY PCR, STOOL (REPLACES STOOL CULTURE)

## 2021-01-06 LAB — C DIFFICILE QUICK SCREEN W PCR REFLEX
C Diff antigen: POSITIVE — AB
C Diff toxin: NEGATIVE

## 2021-01-06 LAB — HAPTOGLOBIN: Haptoglobin: 250 mg/dL (ref 42–296)

## 2021-01-06 LAB — LACTIC ACID, PLASMA: Lactic Acid, Venous: 1.2 mmol/L (ref 0.5–1.9)

## 2021-01-06 LAB — PREPARE RBC (CROSSMATCH)

## 2021-01-06 LAB — CLOSTRIDIUM DIFFICILE BY PCR, REFLEXED: Toxigenic C. Difficile by PCR: NEGATIVE

## 2021-01-06 MED ORDER — IOHEXOL 350 MG/ML SOLN
80.0000 mL | Freq: Once | INTRAVENOUS | Status: AC | PRN
  Administered 2021-01-06: 70 mL via INTRATHECAL

## 2021-01-06 MED ORDER — GADOBUTROL 1 MMOL/ML IV SOLN
5.0000 mL | Freq: Once | INTRAVENOUS | Status: AC | PRN
  Administered 2021-01-06: 5 mL via INTRAVENOUS

## 2021-01-06 MED ORDER — HYDRALAZINE HCL 20 MG/ML IJ SOLN
10.0000 mg | Freq: Four times a day (QID) | INTRAMUSCULAR | Status: DC | PRN
  Administered 2021-01-06: 10 mg via INTRAVENOUS
  Filled 2021-01-06 (×2): qty 1

## 2021-01-06 MED ORDER — LOPERAMIDE HCL 2 MG PO CAPS
2.0000 mg | ORAL_CAPSULE | ORAL | Status: DC | PRN
  Administered 2021-01-06: 22:00:00 2 mg via ORAL
  Filled 2021-01-06: qty 1

## 2021-01-06 MED ORDER — ACETAMINOPHEN 500 MG PO TABS
1000.0000 mg | ORAL_TABLET | Freq: Three times a day (TID) | ORAL | Status: DC | PRN
  Administered 2021-01-06 – 2021-01-08 (×3): 1000 mg via ORAL
  Filled 2021-01-06 (×3): qty 2

## 2021-01-06 NOTE — Progress Notes (Signed)
Patient returned from MRI with complaints of new onset of Headache.  Dr. Fran Lowes notified. Vital are stable and unchanged.

## 2021-01-06 NOTE — Progress Notes (Signed)
SLP Cancellation Note  Patient Details Name: JERMISHA HOFFART MRN: [id number] DOB: 17-Feb-1976   Cancelled treatment:       Reason Eval/Treat Not Completed:  (chart reviewed). Reviewed MRI results(today) and Impression. Also noted Neurosurgery Note from this morning in which pt engaged and followed instructions for Neurologist. 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 Small focus of restricted diffusion within the posterior right temporal lobe may reflect a small more acute infarct. 2. Dural thickening, suggestive of intracranial hypotension, possibly due to over shunting. This could also explain the apparent right parietal convexity extra-axial collection, which would accentuate and exacerbate the tethering.".  Currently this afternoon, pt is resting after experiencing "4 liquid black stools within 1 hour" per NSG; GI is following.  At this time, noted PT/OT recommendations for SNF for Rehab at Discharge. Suspect that Time (post Acute illness) and the environment will be more appropriate for any Cognitive assessment/tx if indicated. ST will hold on Cognitive-linguistic assessment at this time as pt is communicating her wants/needs appropriately to NSG. Will monitor status while admitted.     Jerilynn Som, MS, CCC-SLP Speech Language Pathologist Rehab Services [phone] [facility] 01/06/2021, 3:50 PM

## 2021-01-06 NOTE — H&P (Signed)
Referring Physician:  No referring provider defined for this encounter.  Primary Physician:  Patient, No Pcp Per (Inactive)  Chief Complaint:  sepsis  History of Present Illness: 01/06/2021 Carolyn Herring is a 45 y.o. female who presents with the chief complaint of lethargy and falls. She was severely anemic on presentation.    She has history of spina bifida with shunt placed a long time ago, with most recent revision in 2011.  She denies HA, N, V.  She feels she is back to baseline.  During neurology evaluation, MRI was requested.  I was consulted to evaluate and reprogram her shunt if it is changed by the MRI.  Review of Systems:  A 10 point review of systems is negative, except for the pertinent positives and negatives detailed in the HPI.  Past Medical History: Past Medical History:  Diagnosis Date   Hypertension    Spina bifida [facility])     Past Surgical History: History reviewed. No pertinent surgical history.  Allergies: Allergies as of 01/04/2021 - Review Complete 01/04/2021  Allergen Reaction Noted   Tape Dermatitis 02/09/2011   Codeine Itching 10/21/2017   Hydrogen peroxide Other (See Comments) 10/21/2017   Latex Other (See Comments) 10/21/2017    Medications:  Current Facility-Administered Medications:    Ampicillin-Sulbactam (UNASYN) 3 g in sodium chloride 0.9 % 100 mL IVPB, 3 g, Intravenous, Q6H, Deal, Adonis Housekeeper, RPH, Last Rate: 200 mL/hr at 01/06/21 0309, 3 g at 01/06/21 0309   anidulafungin (ERAXIS) 100 mg in sodium chloride 0.9 % 100 mL IVPB, 100 mg, Intravenous, Q24H, Manandhar, Rozell Searing, MD   budesonide (PULMICORT) nebulizer solution 0.5 mg, 0.5 mg, Nebulization, BID, Belia Heman, Kurian, MD, 0.5 mg at 01/06/21 4235   Chlorhexidine Gluconate Cloth 2 % PADS 6 each, 6 each, Topical, Q0600, Erin Fulling, MD, 6 each at 01/04/21 1703   hydrALAZINE (APRESOLINE) injection 10 mg, 10 mg, Intravenous, Q6H PRN, Manuela Schwartz, NP, 10 mg at 01/06/21 0600    ipratropium-albuterol (DUONEB) 0.5-2.5 (3) MG/3ML nebulizer solution 3 mL, 3 mL, Nebulization, Q6H PRN, Graves, Dana E, NP   methylPREDNISolone sodium succinate (SOLU-MEDROL) 40 mg/mL injection 20 mg, 20 mg, Intravenous, Q12H, Kasa, Kurian, MD, 20 mg at 01/06/21 0519   ondansetron (ZOFRAN) injection 4 mg, 4 mg, Intravenous, Q6H PRN, Graves, Rosalva Ferron, NP   [START ON 01/08/2021] pantoprazole (PROTONIX) injection 40 mg, 40 mg, Intravenous, Q12H, Willy Eddy, MD   pantoprozole (PROTONIX) 80 mg /NS 100 mL infusion, 8 mg/hr, Intravenous, Continuous, Willy Eddy, MD, Last Rate: 10 mL/hr at 01/06/21 0357, 8 mg/hr at 01/06/21 0357   Social History: Social History   Tobacco Use   Smoking status: Every Day    Packs/day: 0.50    Types: Cigarettes   Smokeless tobacco: Never  Vaping Use   Vaping Use: Never used  Substance Use Topics   Alcohol use: Never   Drug use: Never    Family Medical History: History reviewed. No pertinent family history.  Physical Examination: Vitals:   01/06/21 0700 01/06/21 0736  BP: 95/82   Pulse: 95   Resp:    Temp:    SpO2: 99% 100%     General: Patient is well developed, well nourished, calm, collected, and in no apparent distress.  Psychiatric: Patient is non-anxious.  Head:  Pupils equal, round, and reactive to light.  ENT:  Oral mucosa appears well hydrated.  Neck:   Supple.  Full range of motion.  Respiratory: Patient is breathing without any difficulty.  Extremities: No  edema.  Vascular: Palpable pulses in dorsal pedal vessels.  Skin:   On exposed skin, there are no abnormal skin lesions.  NEUROLOGICAL:  General: In no acute distress.   Awake, alert, oriented to person, place, and time.  Pupils equal round and reactive to light.  Facial tone is symmetric.  Tongue protrusion is midline.     Strength: Side Biceps Triceps Deltoid Interossei Grip Wrist Ext. Wrist Flex.  R 5 5 5 5 5 5 5   L 4 4- 4 4 4- 4 4   Side Iliopsoas Quads  Hamstring PF DF EHL  R 5 5 5 5 5 5   L 5 5 5 5 5 5     Bilateral upper and lower extremity sensation is intact to light touch. Reflexes are 2+ and symmetric at the biceps, triceps, brachioradialis, 3+ patella and 2+ achilles.  Clonus is not present.  Toes are down-going.    Gait is untested.   Imaging: CT Head 01/04/2021 IMPRESSION: Head CT:   New hypodense right subdural collection measuring 0.9 cm, without evidence of acute blood products. Underlying loss of gray-white matter differentiation in the right parietal lobe, concerning for acute-subacute infarct. Recommend MRI.   Cervical spine CT:   No acute cervical spine fracture. Unchanged chronic congenital and postoperative changes.   Critical Value/emergent results were called by telephone at the time of interpretation on 01/04/2021 at 2:43 pm to provider [facility] , who verbally acknowledged these results.     Electronically Signed   By: 03/06/2021 M.D.   On: 01/04/2021 14:48  Skull xray 01/04/21 IMPRESSION: Ventriculostomy catheter, with shunt set 110 mm of H2O, without evidence of shunt discontinuity or kinking.     Electronically Signed   By: Caprice Renshaw M.D.   On: 01/04/2021 18:47  I have personally reviewed the images and agree with the above interpretation.  Labs: CBC Latest Ref Rng & Units 01/06/2021 01/05/2021 01/05/2021  WBC 4.0 - 10.5 K/uL 18.7(H) 24.2(H) -  Hemoglobin 12.0 - 15.0 g/dL 11.9(L) 11.8(L) 11.8(L)  Hematocrit 36.0 - 46.0 % 36.3 34.7(L) 33.7(L)  Platelets 150 - 400 K/uL 288 372 -       Assessment and Plan: Ms. Tech is a pleasant 45 y.o. female with history of shunt placement who is being treated for fungemia, GI bleed, anemia. Neurology has requested MRI brain.  - Will review shunt with skull xray after MRI.  Given new parietal fluid collection, will consider increasing the setting to 140. - Other care per primary team.     03/07/2021. Jeanella Craze MD, MPHS Dept. of  Neurosurgery

## 2021-01-06 NOTE — Progress Notes (Addendum)
Occupational Therapy Treatment Patient Details Name: Carolyn Herring MRN: [id number] DOB: [birth year] Today's Date: 01/06/2021   History of present illness Pt is a 45 y.o. F arriving to ED after a fall with decreased responsiveness and hx of abdominal pain and diarrhea. Upon arrival pt was found to have severe GI bleed. 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   OT comments  Ms. Zaborowski seen for OT treatment on this date. Upon arrival to room pt awake/alert. Pt seated upright in bed and incontinent/unaware. Pt reporting pain in abdomen. Pt agreeable to tx. Pt MOD A for standing with use of RW. Pt tolerates multiple bouts of standing for hygiene~10 minutes. TOTAL A + RW for perihygeine and clothing  management in standing. Multiple BMs during session, with transfers. Pt returned to bed and placed on bed pan with RN notified.  Pt making good progress toward goals. Pt continues to benefit from skilled OT services to maximize return to PLOF and minimize risk of future falls, injury, caregiver burden, and readmission. Will continue to follow POC. Discharge recommendation remains appropriate.     Recommendations for follow up therapy are one component of a multi-disciplinary discharge planning process, led by the attending physician.  Recommendations may be updated based on patient status, additional functional criteria and insurance authorization.    Follow Up Recommendations  SNF    Equipment Recommendations   (defer)       Precautions / Restrictions Precautions Precautions: Fall Restrictions Weight Bearing Restrictions: No       Mobility Bed Mobility Overal bed mobility: Needs Assistance Bed Mobility: Rolling;Supine to Sit Rolling: Min assist   Supine to sit: Mod assist;HOB elevated          Transfers Overall transfer level: Needs assistance Equipment used: Rolling  walker (2 wheeled) Transfers: Sit to/from Stand Sit to Stand: Mod assist              Balance Overall balance assessment: Needs assistance Sitting-balance support: Bilateral upper extremity supported;Feet unsupported Sitting balance-Leahy Scale: Poor     Standing balance support: Bilateral upper extremity supported;During functional activity Standing balance-Leahy Scale: Poor                             ADL either performed or assessed with clinical judgement   ADL Overall ADL's : Needs assistance/impaired                                       General ADL Comments: Pt MOD A for standing, tolerates ~10 minutes. TOTAL A + RW for perihygeine, standing. TOTAL A for clothing mgmt, standing.      Cognition Arousal/Alertness: Awake/alert Behavior During Therapy: WFL for tasks assessed/performed Overall Cognitive Status: No family/caregiver present to determine baseline cognitive functioning                                          Exercises Exercises: Other exercises Other Exercises Other Exercises: Pt educ re: importance of mvmt to maintain functional mobility, falls prevention Other Exercises: Sup<>sit, sit<>stand, perihygeine ~10, clothing mgmt   Shoulder Instructions       General Comments      Pertinent  Vitals/ Pain       Pain Assessment: Faces Faces Pain Scale: Hurts even more Pain Location: abdomen Pain Descriptors / Indicators: Discomfort;Aching Pain Intervention(s): Limited activity within patient's tolerance;Repositioned   Frequency  Min 2X/week        Progress Toward Goals  OT Goals(current goals can now be found in the care plan section)  Progress towards OT goals: Progressing toward goals  Acute Rehab OT Goals Patient Stated Goal: To feel better OT Goal Formulation: With patient Time For Goal Achievement: 01/05/21 Potential to Achieve Goals: Good ADL Goals Pt Will Perform Grooming: with  supervision;standing Pt Will Perform Lower Body Dressing: with supervision;sit to/from stand Pt Will Transfer to Toilet: with supervision;ambulating Pt Will Perform Toileting - Clothing Manipulation and hygiene: with supervision;sit to/from stand  Plan Discharge plan remains appropriate       AM-PAC OT "6 Clicks" Daily Activity     Outcome Measure   Help from another person eating meals?: A Little Help from another person taking care of personal grooming?: A Little Help from another person toileting, which includes using toliet, bedpan, or urinal?: A Lot Help from another person bathing (including washing, rinsing, drying)?: Total Help from another person to put on and taking off regular upper body clothing?: A Little Help from another person to put on and taking off regular lower body clothing?: A Lot 6 Click Score: 14    End of Session Equipment Utilized During Treatment: Rolling walker  OT Visit Diagnosis: Unsteadiness on feet (R26.81);Muscle weakness (generalized) (M62.81)   Activity Tolerance Patient limited by fatigue;Patient limited by pain   Patient Left in bed;with call bell/phone within reach   Nurse Communication Other (comment) (BM)        Time: 4825-0037 OT Time Calculation (min): 32 min  Charges: OT General Charges $OT Visit: 1 Visit OT Treatments $Self Care/Home Management : 23-37 mins  [place] [facility], Adine Madura 01/06/2021, 3:36 PM

## 2021-01-06 NOTE — Progress Notes (Signed)
GI Inpatient Follow-up Note  Subjective:  Patient seen and doing well. No bleeding. Hemoglobin stable. Found to have candida in her blood.  Scheduled Inpatient Medications:   budesonide (PULMICORT) nebulizer solution  0.5 mg Nebulization BID   Chlorhexidine Gluconate Cloth  6 each Topical Q0600   methylPREDNISolone (SOLU-MEDROL) injection  20 mg Intravenous Q12H   [START ON 01/08/2021] pantoprazole  40 mg Intravenous Q12H    Continuous Inpatient Infusions:    ampicillin-sulbactam (UNASYN) IV Stopped (01/06/21 1109)   anidulafungin     pantoprazole 8 mg/hr (01/06/21 0357)    PRN Inpatient Medications:  acetaminophen, hydrALAZINE, ipratropium-albuterol, ondansetron (ZOFRAN) IV  Review of Systems:  Review of Systems  Constitutional:  Negative for chills and fever.  Respiratory:  Negative for shortness of breath.   Cardiovascular:  Negative for chest pain.  Gastrointestinal:  Positive for abdominal pain. Negative for blood in stool and melena.  Musculoskeletal:  Positive for joint pain.  Skin:  Negative for rash.  Neurological:  Positive for focal weakness.  Psychiatric/Behavioral:  Negative for substance abuse.   All other systems reviewed and are negative.    Physical Examination: BP 135/89   Pulse 95   Temp 98.1 F (36.7 C) (Oral)   Resp (!) 23   Ht 5' (1.524 m)   Wt 49.6 kg   SpO2 92%   BMI 21.36 kg/m  Gen: NAD, alert and oriented x 4 HEENT: PEERLA, EOMI, Neck: supple, no JVD or thyromegaly Chest: No respiratory distress CV: RRR Abd: patient deferred due to tenderness Ext: no edema Skin: no rash or lesions noted Lymph: no LAD  Data: Lab Results  Component Value Date   WBC 18.7 (H) 01/06/2021   HGB 11.9 (L) 01/06/2021   HCT 36.3 01/06/2021   MCV 81.4 01/06/2021   PLT 288 01/06/2021   Recent Labs  Lab 01/05/21 0132 01/05/21 0540 01/06/21 0415  HGB 11.8* 11.8* 11.9*   Lab Results  Component Value Date   NA 141 01/06/2021   K 4.6 01/06/2021    CL 109 01/06/2021   CO2 23 01/06/2021   BUN 13 01/06/2021   CREATININE 0.71 01/06/2021   Lab Results  Component Value Date   ALT 13 01/04/2021   AST 20 01/04/2021   ALKPHOS 82 01/04/2021   BILITOT 0.6 01/04/2021   Recent Labs  Lab 01/04/21 1915 01/05/21 0540  APTT 27  --   INR 1.1 1.1   Assessment/Plan: Carolyn Herring is a 45 y.o. 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 Recommend nutrition consult. - has trouble with pills so at discharge will need PPI in capsule form so she can break apart and take twice daily - esophagus with localized esophagitis at GE junction so no absolute contraindication to TEE - no blood thinners unless absolutely necessary  Will continue to follow, please call with any questions or concerns.  Merlyn Lot MD, MPH [facility]

## 2021-01-06 NOTE — [place] FL2 (Signed)
[place] MEDICAID FL2 LEVEL OF CARE SCREENING TOOL     IDENTIFICATION  Patient Name: Carolyn Herring Birthdate: [birth year] Sex: female Admission Date (Current Location): 01/04/2021  [place] and [place] Number:  [address] [id number] Facility and Address:  [facility], [address], [place], [place] [address]      Provider Number: [id number]  Attending Physician Name and Address:  Darlin Priestly, MD  Relative Name and Phone Number:  Donnal Debar (Brother)   [phone] (Mobile)    Current Level of Care: SNF Recommended Level of Care: Skilled Nursing Facility Prior Approval Number:    Date Approved/Denied:   PASRR Number: [id number]  Discharge Plan: SNF    Current Diagnoses: Patient Active Problem List   Diagnosis Date Noted   Fall    Altered mental status    History of brain shunt    Hemorrhagic shock (HCC) 01/04/2021    Orientation RESPIRATION BLADDER Height & Weight     Self, Time, Situation, Place  Normal Continent Weight: 109 lb 5.6 oz (49.6 kg) Height:  5' (152.4 cm)  BEHAVIORAL SYMPTOMS/MOOD NEUROLOGICAL BOWEL NUTRITION STATUS      Continent Diet  AMBULATORY STATUS COMMUNICATION OF NEEDS Skin   Limited Assist Verbally Normal                       Personal Care Assistance Level of Assistance  Bathing, Dressing, Feeding, Total care Bathing Assistance: Limited assistance Feeding assistance: Independent Dressing Assistance: Limited assistance Total Care Assistance: Limited assistance   Functional Limitations Info  Sight, Hearing, Speech Sight Info: Adequate Hearing Info: Adequate Speech Info: Adequate    SPECIAL CARE FACTORS FREQUENCY  PT (By licensed PT), OT (By licensed OT)     PT Frequency: 5X per week OT Frequency: 5X per week            Contractures Contractures Info: Not present    Additional Factors Info                  Current Medications (01/06/2021):  This is the current hospital active  medication list Current Facility-Administered Medications  Medication Dose Route Frequency Provider Last Rate Last Admin   Ampicillin-Sulbactam (UNASYN) 3 g in sodium chloride 0.9 % 100 mL IVPB  3 g Intravenous Q6H Deal, Adonis Housekeeper, RPH 200 mL/hr at 01/06/21 0309 3 g at 01/06/21 0309   anidulafungin (ERAXIS) 100 mg in sodium chloride 0.9 % 100 mL IVPB  100 mg Intravenous Q24H Odette Fraction, MD       budesonide (PULMICORT) nebulizer solution 0.5 mg  0.5 mg Nebulization BID Erin Fulling, MD   0.5 mg at 01/06/21 5681   Chlorhexidine Gluconate Cloth 2 % PADS 6 each  6 each Topical [id number] Erin Fulling, MD   6 each at 01/04/21 1703   hydrALAZINE (APRESOLINE) injection 10 mg  10 mg Intravenous Q6H PRN Manuela Schwartz, NP   10 mg at 01/06/21 0600   ipratropium-albuterol (DUONEB) 0.5-2.5 (3) MG/3ML nebulizer solution 3 mL  3 mL Nebulization Q6H PRN Lianne Cure, NP       methylPREDNISolone sodium succinate (SOLU-MEDROL) 40 mg/mL injection 20 mg  20 mg Intravenous Q12H Erin Fulling, MD   20 mg at 01/06/21 0519   ondansetron (ZOFRAN) injection 4 mg  4 mg Intravenous Q6H PRN Lianne Cure, NP       [START ON 01/08/2021] pantoprazole (PROTONIX) injection 40 mg  40 mg Intravenous Q12H Willy Eddy, MD  pantoprozole (PROTONIX) 80 mg /NS 100 mL infusion  8 mg/hr Intravenous Continuous Willy Eddy, MD 10 mL/hr at 01/06/21 0357 8 mg/hr at 01/06/21 0357     Discharge Medications: Please see discharge summary for a list of discharge medications.  Relevant Imaging Results:  Relevant Lab Results:   Additional Information SS# [id number]  Joseph Art, LCSWA

## 2021-01-06 NOTE — TOC Initial Note (Signed)
Transition of Care [facility]) - Initial/Assessment Note    Patient Details  Name: Carolyn Herring MRN: [id number] Date of Birth: 1975-06-13  Transition of [facility]) CM/SW Contact:    Marina Goodell Phone Number: [phone] 01/06/2021, 9:48 AM  Clinical Narrative:                  Patient presents to [facility] due to fall, inability to get up and feeling weaker since COVID dx. Patient is independent w/ all ADLs.  Main contact pt's brother Donnal Debar [phone] [facility]).  PT has recommended SNF.  Expected Discharge Plan: Skilled Nursing Facility Barriers to Discharge: Continued Medical Work up   Patient Goals and CMS Choice Patient states their goals for this hospitalization and ongoing recovery are:: To get back home as soon as possible   Choice offered to / list presented to : Patient  Expected Discharge Plan and Services Expected Discharge Plan: Skilled Nursing Facility In-house Referral: Clinical Social Work   Post Acute Care Choice: Skilled Nursing Facility Living arrangements for the past 2 months: Single Family Home                                      Prior Living Arrangements/Services Living arrangements for the past 2 months: Single Family Home Lives with:: Self Patient language and need for interpreter reviewed:: Yes Do you feel safe going back to the place where you live?: Yes      Need for Family Participation in Patient Care: Yes (Comment) Care giver support system in place?: Yes (comment) Current home services: DME Criminal Activity/Legal Involvement Pertinent to Current Situation/Hospitalization: No - Comment as needed  Activities of Daily Living      Permission Sought/Granted Permission sought to share information with : Family Supports Permission granted to share information with : Yes, Verbal Permission Granted  Share Information with NAME: Donnal Debar (Brother)   [phone] (Mobile)           Emotional  Assessment Appearance:: Appears stated age Attitude/Demeanor/Rapport: Engaged Affect (typically observed): Stable Orientation: : Oriented to Self, Oriented to Place, Oriented to  Time, Oriented to Situation Alcohol / Substance Use: Not Applicable Psych Involvement: No (comment)  Admission diagnosis:  Hemorrhagic shock (HCC) [R57.8] Fall [W19.XXXA] Altered mental status, unspecified altered mental status type [R41.82] Patient Active Problem List   Diagnosis Date Noted   Fall    Altered mental status    History of brain shunt    Hemorrhagic shock (HCC) 01/04/2021   PCP:  Patient, No Pcp Per (Inactive) Pharmacy:  No Pharmacies Listed    Social Determinants of Health (SDOH) Interventions    Readmission Risk Interventions No flowsheet data found.

## 2021-01-06 NOTE — Progress Notes (Signed)
[facility] INF[facility] PROGRESS NOTE Date of Admission:  01/04/2021     ID: [place] Herring is a 45 y.o. female with  Candidemia Active Problems:   Hemorrhagic shock (HCC)   Fall   Altered mental status   History of brain shunt   Subjective: No fevers, remains alert. Had MRI today  ROS  Eleven systems are reviewed and negative except per hpi  Medications:  Antibiotics Given (last 72 hours)     Date/Time Action Medication Dose Rate   01/04/21 1520 New Bag/Given   piperacillin-tazobactam (ZOSYN) IVPB 3.375 g 3.375 g 100 mL/hr   01/04/21 2204 New Bag/Given   Ampicillin-Sulbactam (UNASYN) 3 g in sodium chloride 0.9 % 100 mL IVPB 3 g 200 mL/hr   01/05/21 0426 New Bag/Given   Ampicillin-Sulbactam (UNASYN) 3 g in sodium chloride 0.9 % 100 mL IVPB 3 g 200 mL/hr   01/05/21 0904 New Bag/Given   Ampicillin-Sulbactam (UNASYN) 3 g in sodium chloride 0.9 % 100 mL IVPB 3 g 200 mL/hr   01/05/21 1501 New Bag/Given   Ampicillin-Sulbactam (UNASYN) 3 g in sodium chloride 0.9 % 100 mL IVPB 3 g 200 mL/hr   01/05/21 2141 New Bag/Given   Ampicillin-Sulbactam (UNASYN) 3 g in sodium chloride 0.9 % 100 mL IVPB 3 g 200 mL/hr   01/06/21 0309 New Bag/Given   Ampicillin-Sulbactam (UNASYN) 3 g in sodium chloride 0.9 % 100 mL IVPB 3 g 200 mL/hr   01/06/21 1046 New Bag/Given   Ampicillin-Sulbactam (UNASYN) 3 g in sodium chloride 0.9 % 100 mL IVPB 3 g 200 mL/hr       budesonide (PULMICORT) nebulizer solution  0.5 mg Nebulization BID   Chlorhexidine Gluconate Cloth  6 each Topical Q0600   methylPREDNISolone (SOLU-MEDROL) injection  20 mg Intravenous Q12H   [START ON 01/08/2021] pantoprazole  40 mg Intravenous Q12H    Objective: Vital signs in last 24 hours: Temp:  [97.5 F (36.4 C)-98.1 F (36.7 C)] 98.1 F (36.7 C) (10/12 0900) Pulse Rate:  [70-99] 95 (10/12 1100) Resp:  [14-26] 23 (10/12 1100) BP: (95-168)/(82-136) 135/89 (10/12 1100) SpO2:  [89 %-100 %] 92 % (10/12 1100) Weight:   [49.6 kg] 49.6 kg (10/12 0500) Constitutional:  oriented to person, place, and time. 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  Mouth/Throat: edentulous Oropharynx is clear and dry . No oropharyngeal exudate.  Shunt under  scalp with no pain, tenderness Cardiovascular: Normal rate, regular rhythm and normal heart sounds.  No murmur heard.  Pulmonary/Chest: Effort normal and breath sounds normal. No respiratory distress.  has no wheezes.  Neck = supple, no nuchal rigidity Abdominal: Soft. Bowel sounds are normal.  exhibits no distension. There is no tenderness.  Lymphadenopathy: no cervical adenopathy. 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 Some contractures on hands Skin: Skin is warm and dry. No rash noted. No erythema.  Psychiatric: a normal mood and affect.  behavior is normal.   Lab Results Recent Labs    01/05/21 0540 01/06/21 0415  WBC 24.2* 18.7*  HGB 11.8* 11.9*  HCT 34.7* 36.3  NA 138 141  K 4.1 4.6  CL 107 109  CO2 21* 23  BUN 21* 13  CREATININE 0.75 0.71    Microbiology: Results for orders placed or performed during the hospital encounter of 01/04/21  Resp Panel by RT-PCR (Flu A&B, Covid) Nasopharyngeal Swab  Status: None   Collection Time: 01/04/21 12:05 PM   Specimen: Nasopharyngeal Swab; Nasopharyngeal(NP) swabs in vial transport medium  Result Value Ref Range Status   SARS Coronavirus 2 by RT PCR NEGATIVE NEGATIVE Final    Comment: (NOTE) SARS-CoV-2 target nucleic acids are NOT DETECTED.  The SARS-CoV-2 RNA is generally detectable in upper respiratory specimens during the acute phase of infection. The lowest concentration of SARS-CoV-2 viral copies this assay can detect is 138 copies/mL. A negative result does not preclude SARS-Cov-2 infection and should not be used as the sole basis for treatment or other patient management  decisions. A negative result may occur with  improper specimen collection/handling, submission of specimen other than nasopharyngeal swab, presence of viral mutation(s) within the areas targeted by this assay, and inadequate number of viral copies(<138 copies/mL). A negative result must be combined with clinical observations, patient history, and epidemiological information. The expected result is Negative.  Fact Sheet for Patients:  BloggerCourse.com  Fact Sheet for Healthcare Providers:  SeriousBroker.it  This test is no t yet approved or cleared by the [place] FDA and  has been authorized for detection and/or diagnosis of SARS-CoV-2 by FDA under an Emergency Use Authorization (EUA). This EUA will remain  in effect (meaning this test can be used) for the duration of the COVID-19 declaration under Section 564(b)(1) of the Act, 21 U.S.C.section 360bbb-3(b)(1), unless the authorization is terminated  or revoked sooner.       Influenza A by PCR NEGATIVE NEGATIVE Final   Influenza B by PCR NEGATIVE NEGATIVE Final    Comment: (NOTE) The Xpert Xpress SARS-CoV-2/FLU/RSV plus assay is intended as an aid in the diagnosis of influenza from Nasopharyngeal swab specimens and should not be used as a sole basis for treatment. Nasal washings and aspirates are unacceptable for Xpert Xpress SARS-CoV-2/FLU/RSV testing.  Fact Sheet for Patients: BloggerCourse.com  Fact Sheet for Healthcare Providers: SeriousBroker.it  This test is not yet approved or cleared by the [place] FDA and has been authorized for detection and/or diagnosis of SARS-CoV-2 by FDA under an Emergency Use Authorization (EUA). This EUA will remain in effect (meaning this test can be used) for the duration of the COVID-19 declaration under Section 564(b)(1) of the Act, 21 U.S.C. section 360bbb-3(b)(1), unless the  authorization is terminated or revoked.  Performed at [facility], [address] [place], [place] [address]   Blood culture (routine single)     Status: None (Preliminary result)   Collection Time: 01/04/21  2:58 PM   Specimen: BLOOD  Result Value Ref Range Status   Specimen Description   Final    BLOOD LEFT ANTECUBITAL Performed at [facility], [address], [place], [place] [address]    Special Requests   Final    BOTTLES DRAWN AEROBIC AND ANAEROBIC Blood Culture adequate volume Performed at [facility], [address] [place], [place] [address]    Culture  Setup Time   Final    YEAST ANAEROBIC BOTTLE ONLY Organism ID to follow CRITICAL RESULT CALLED TO, READ BACK BY AND VERIFIED WITH: SAMANTHA RAUER 01/05/21 1507 SLM IN BOTH AEROBIC AND ANAEROBIC BOTTLES Performed at [facility], [address], [place], [place] [address]    Culture   Final    CULTURE REINCUBATED FOR BETTER GROWTH Performed at [facility], [address] [place], [place] [address]    Report Status PENDING  Incomplete  Blood Culture ID Panel (Reflexed)     Status:  Abnormal   Collection Time: 01/04/21  2:58 PM  Result Value Ref Range Status   Enterococcus faecalis NOT DETECTED NOT DETECTED Final   Enterococcus Faecium NOT DETECTED NOT DETECTED Final   Listeria monocytogenes NOT DETECTED NOT DETECTED Final   Staphylococcus species NOT DETECTED NOT DETECTED Final   Staphylococcus aureus (BCID) NOT DETECTED NOT DETECTED Final   Staphylococcus epidermidis NOT DETECTED NOT DETECTED Final   Staphylococcus lugdunensis NOT DETECTED NOT DETECTED Final   Streptococcus species NOT DETECTED NOT DETECTED Final   Streptococcus agalactiae NOT DETECTED NOT DETECTED Final   Streptococcus pneumoniae NOT DETECTED NOT DETECTED Final   Streptococcus pyogenes NOT DETECTED NOT DETECTED Final   A.calcoaceticus-baumannii NOT DETECTED NOT DETECTED Final   Bacteroides  fragilis NOT DETECTED NOT DETECTED Final   Enterobacterales NOT DETECTED NOT DETECTED Final   Enterobacter cloacae complex NOT DETECTED NOT DETECTED Final   Escherichia coli NOT DETECTED NOT DETECTED Final   Klebsiella aerogenes NOT DETECTED NOT DETECTED Final   Klebsiella oxytoca NOT DETECTED NOT DETECTED Final   Klebsiella pneumoniae NOT DETECTED NOT DETECTED Final   Proteus species NOT DETECTED NOT DETECTED Final   Salmonella species NOT DETECTED NOT DETECTED Final   Serratia marcescens NOT DETECTED NOT DETECTED Final   Haemophilus influenzae NOT DETECTED NOT DETECTED Final   Neisseria meningitidis NOT DETECTED NOT DETECTED Final   Pseudomonas aeruginosa NOT DETECTED NOT DETECTED Final   Stenotrophomonas maltophilia NOT DETECTED NOT DETECTED Final   Candida albicans NOT DETECTED NOT DETECTED Final   Candida auris NOT DETECTED NOT DETECTED Final   Candida glabrata NOT DETECTED NOT DETECTED Final   Candida krusei DETECTED (A) NOT DETECTED Final    Comment: CRITICAL RESULT CALLED TO, READ BACK BY AND VERIFIED WITH: SAMANTHA RAUER 01/05/21 1507 SLM    Candida parapsilosis NOT DETECTED NOT DETECTED Final   Candida tropicalis NOT DETECTED NOT DETECTED Final   Cryptococcus neoformans/gattii NOT DETECTED NOT DETECTED Final    Comment: Performed at [facility], [address] [place], [place] [address]  MRSA Next Gen by PCR, Nasal     Status: None   Collection Time: 01/04/21  4:58 PM   Specimen: Nasal Mucosa; Nasal Swab  Result Value Ref Range Status   MRSA by PCR Next Gen NOT DETECTED NOT DETECTED Final    Comment: (NOTE) The GeneXpert MRSA Assay (FDA approved for NASAL specimens only), is one component of a comprehensive MRSA colonization surveillance program. It is not intended to diagnose MRSA infection nor to guide or monitor treatment for MRSA infections. Test performance is not FDA approved in patients less than 54 years old. Performed at [facility] [place] [facility], [address] [place], [place] [address]   Culture, blood (single)     Status: None (Preliminary result)   Collection Time: 01/05/21  4:42 PM   Specimen: BLOOD  Result Value Ref Range Status   Specimen Description BLOOD BLOOD LEFT HAND  Final   Special Requests   Final    BOTTLES DRAWN AEROBIC AND ANAEROBIC Blood Culture adequate volume   Culture   Final    NO GROWTH < 24 HOURS Performed at [facility], [address], [place], [place] [address]    Report Status PENDING  Incomplete    Studies/Results: DG Skull 1-3 Views  Result Date: 01/04/2021 CLINICAL DATA:  Evaluation for shunt EXAM: SKULL - 1-3 VIEW COMPARISON:  01/04/2021 CT head. FINDINGS: There is no evidence of skull fracture or other focal bone lesions. Right frontal  approach ventriculostomy catheter, with shunt set at 110 mm H2O. No evidence of shunt discontinuity or kinking. IMPRESSION: Ventriculostomy catheter, with shunt set 110 mm of H2O, without evidence of shunt discontinuity or kinking. Electronically Signed   By: Wiliam Ke M.D.   On: 01/04/2021 18:47   CT HEAD WO CONTRAST ( )  Result Date: 01/04/2021 CLINICAL DATA:  Neck trauma, dangerous injury mechanism (Age 69-64y); Head trauma, focal neuro findings (Age 58-64y) EXAM: CT HEAD WITHOUT CONTRAST CT CERVICAL SPINE WITHOUT CONTRAST TECHNIQUE: Multidetector CT imaging of the head and cervical spine was performed following the standard protocol without intravenous contrast. Multiplanar CT image reconstructions of the cervical spine were also generated. COMPARISON:  Head CT 10/12/2019, cervical spine CT 11/10/2012. FINDINGS: CT HEAD FINDINGS Brain: There is a new hypodense right subdural collection, without visible acute blood products.There is adjacent loss of gray-white matter attenuation in the right parietal lobe, concerning for ischemia.There is a right frontal approach ventriculostomy catheter with tip unchanged in position in the midline between the  frontal horns. Vascular: No hyperdense vessel or unexpected calcification. Skull: Right frontal burr hole. Prior suboccipital craniotomy. No acute fracture. Sinuses/Orbits: Paranasal sinuses are predominantly clear. Other: None. CT CERVICAL SPINE FINDINGS Alignment: Unchanged Skull base and vertebrae: No acute fracture. Soft tissues and spinal canal: VP shunt catheter courses down the right neck. No prevertebral soft tissue swelling. No visible canal hematoma. Disc levels: Enlargement of the spinal canal throughout the cervical spine. 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 Unchanged tubing within the spinal canal in comparison to prior exam in 2014. Upper chest: Negative. Other: None. IMPRESSION: Head CT: New hypodense right subdural collection measuring 0.9 cm, without evidence of acute blood products. Underlying loss of gray-white matter differentiation in the right parietal lobe, concerning for acute-subacute infarct. Recommend MRI. Cervical spine CT: No acute cervical spine fracture. Unchanged chronic congenital and postoperative changes. Critical Value/emergent results were called by telephone at the time of interpretation on 01/04/2021 at 2:43 pm to provider [facility] , who verbally acknowledged these results. Electronically Signed   By: Caprice Renshaw M.D.   On: 01/04/2021 14:48   CT Cervical Spine Wo Contrast  Result Date: 01/04/2021 CLINICAL DATA:  Neck trauma, dangerous injury mechanism (Age 60-64y); Head trauma, focal neuro findings (Age 36-64y) EXAM: CT HEAD WITHOUT CONTRAST CT CERVICAL SPINE WITHOUT CONTRAST TECHNIQUE: Multidetector CT imaging of the head and cervical spine was performed following the standard protocol without intravenous contrast. Multiplanar CT image reconstructions of the cervical spine were also generated. COMPARISON:  Head CT 10/12/2019, cervical spine CT 11/10/2012. FINDINGS: CT HEAD FINDINGS Brain: There is a new hypodense right subdural collection,  without visible acute blood products.There is adjacent loss of gray-white matter attenuation in the right parietal lobe, concerning for ischemia.There is a right frontal approach ventriculostomy catheter with tip unchanged in position in the midline between the frontal horns. Vascular: No hyperdense vessel or unexpected calcification. Skull: Right frontal burr hole. Prior suboccipital craniotomy. No acute fracture. Sinuses/Orbits: Paranasal sinuses are predominantly clear. Other: None. CT CERVICAL SPINE FINDINGS Alignment: Unchanged Skull base and vertebrae: No acute fracture. Soft tissues and spinal canal: VP shunt catheter courses down the right neck. No prevertebral soft tissue swelling. No visible canal hematoma. Disc levels: Enlargement of the spinal canal throughout the cervical spine. 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 Unchanged tubing within the spinal canal in comparison to prior exam in 2014. Upper chest: Negative. Other: None. IMPRESSION: Head CT:  New hypodense right subdural collection measuring 0.9 cm, without evidence of acute blood products. Underlying loss of gray-white matter differentiation in the right parietal lobe, concerning for acute-subacute infarct. Recommend MRI. Cervical spine CT: No acute cervical spine fracture. Unchanged chronic congenital and postoperative changes. Critical Value/emergent results were called by telephone at the time of interpretation on 01/04/2021 at 2:43 pm to provider [facility] , who verbally acknowledged these results. Electronically Signed   By: Caprice Renshaw M.D.   On: 01/04/2021 14:48   CT CHEST ABDOMEN PELVIS W CONTRAST  Result Date: 01/04/2021 CLINICAL DATA:  recurrent falls, VP shunt present EXAM: CT CHEST, ABDOMEN, AND PELVIS WITH CONTRAST TECHNIQUE: Multidetector CT imaging of the chest, abdomen and pelvis was performed following the standard protocol during bolus administration of intravenous contrast. CONTRAST:  77mL  OMNIPAQUE IOHEXOL 350 MG/ML SOLN COMPARISON:  None. FINDINGS: CT CHEST FINDINGS Cardiovascular: Normal cardiac size.No pericardial disease.Normal size main and branch pulmonary arteries.The thoracic aorta is unremarkable. Mediastinum/Nodes: No lymphadenopathy.The thyroid is unremarkable.Esophagus is unremarkable.The trachea is unremarkable. Lungs/Pleura: No focal airspace consolidation.No suspicious pulmonary nodules or masses.No pleural effusion.No pneumothorax. 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 Musculoskeletal: No acute osseous abnormality.No suspicious lytic or blastic lesions. Congenital enlarged spinal canal. CT ABDOMEN PELVIS FINDINGS Hepatobiliary: No hepatic injury or perihepatic hematoma. No gallstones, gallbladder wall thickening, or biliary dilatation. Pancreas: Unremarkable. No pancreatic ductal dilatation or surrounding inflammatory changes. Spleen: Focal peripheral somewhat wedge-shaped hypodensity in the upper posterior aspect of the spleen, morphology favoring small cyst, hemangioma, or possibly infarct (series 2, image 47) rather than laceration. Adrenals/Urinary Tract: No adrenal hemorrhage or renal injury identified. Bladder is unremarkable. No hydronephrosis or nephrolithiasis. No renal laceration. Subcentimeter hypodense right renal lesion, likely cyst. The bladder is minimally distended but unremarkable. Stomach/Bowel: The stomach is within normal limits. There is no evidence of bowel obstruction. No evidence of appendicitis. Vascular/Lymphatic: Diffuse atherosclerotic calcified and noncalcified plaque throughout the abdominal aorta, which appears diminutive distally. Reproductive: Unremarkable. Other: No abdominal wall hernia or abnormality. No abdominopelvic ascites. VP shunt tubing terminates in the pelvis. Musculoskeletal: No acute osseous abnormality. Mild bilateral hip osteoarthritis. Diffuse muscle atrophy. Congenital enlargement of the spinal canal. IMPRESSION: No  evidence of acute trauma in the chest. Focal peripheral hypodensity in the upper posterior aspect of the spleen, morphology favoring small cyst, hemangioma, or possibly splenic infarct, and felt unlikely to be acute trauma. No other acute findings in the abdomen or pelvis. Electronically Signed   By: Caprice Renshaw M.D.   On: 01/04/2021 14:56   CT T-SPINE NO CHARGE  Result Date: 01/04/2021 CLINICAL DATA:  Fall EXAM: CT THORACIC SPINE WITHOUT CONTRAST TECHNIQUE: Multidetector CT images of the thoracic were obtained using the standard protocol without intravenous contrast. COMPARISON:  None. FINDINGS: Alignment: Normal. Vertebrae: No acute fracture or focal pathologic process. Paraspinal and other soft tissues: Reported separately on chest CT. Disc levels: Preserved disc heights. Chronic enlargement of the spinal canal. IMPRESSION: No acute thoracic spine fracture. Electronically Signed   By: Caprice Renshaw M.D.   On: 01/04/2021 14:18   CT L-SPINE NO CHARGE  Result Date: 01/04/2021 CLINICAL DATA:  Fall EXAM: CT LUMBAR SPINE WITHOUT CONTRAST TECHNIQUE: Multidetector CT imaging of the lumbar spine was performed without intravenous contrast administration. Multiplanar CT image reconstructions were also generated. COMPARISON:  None. FINDINGS: Segmentation: 5 lumbar type vertebrae. Alignment: Normal Vertebrae: No acute fracture or focal pathologic process. Paraspinal and other soft tissues: Reported separately on CT abdomen and pelvis Disc  levels: No visible impingement. IMPRESSION: No acute lumbar spine fracture. Electronically Signed   By: Caprice Renshaw M.D.   On: 01/04/2021 14:13   ECHOCARDIOGRAM COMPLETE  Result Date: 01/05/2021    ECHOCARDIOGRAM REPORT   Patient Name:   HIKARI TRIPP Date of Exam: 01/05/2021 Medical Rec #:  [id number]        Height:       60.0 in Accession #:    [id number]       Weight:       106.9 lb Date of Birth:  05/03/1975       BSA:          1.430 m Patient Age:    44 years         BP:            157/111 mmHg Patient Gender: F                HR:           97 bpm. Exam Location:  [facility] Procedure: 2D Echo, Color Doppler and Cardiac Doppler Indications:     I63.9 Stroke  History:         Patient has no prior history of Echocardiogram examinations.                  Risk Factors:Hypertension.  Sonographer:     Humphrey Rolls Referring Phys:  [phone] MCNEILL P KIRKPATRICK Diagnosing Phys: Yvonne Kendall MD  Sonographer Comments: Suboptimal subcostal window. IMPRESSIONS  1. Left ventricular ejection fraction, by estimation, is 55 to 60%. The left ventricle has normal function. The left ventricle has no regional wall motion abnormalities. Left ventricular diastolic parameters are consistent with Grade I diastolic dysfunction (impaired relaxation). Elevated left atrial pressure.  2. Right ventricular systolic function is normal. The right ventricular size is normal.  3. The mitral valve is abnormal. Cannot exclude prolapse of posterior leaflet or cleft. There is at least mild to moderate mitral valve regurgitation. No evidence of mitral stenosis.  4. The aortic valve is tricuspid. Aortic valve regurgitation is not visualized. No aortic stenosis is present.  5. The inferior vena cava is normal in size with <50% respiratory variability, suggesting right atrial pressure of 8 mmHg. FINDINGS  Left Ventricle: Left ventricular ejection fraction, by estimation, is 55 to 60%. The left ventricle has normal function. The left ventricle has no regional wall motion abnormalities. The left ventricular internal cavity size was normal in size. There is  no left ventricular hypertrophy. Left ventricular diastolic parameters are consistent with Grade I diastolic dysfunction (impaired relaxation). Elevated left atrial pressure. Right Ventricle: The right ventricular size is normal. No increase in right ventricular wall thickness. Right ventricular systolic function is normal. Left Atrium: Left atrial size was normal in size. Right  Atrium: Right atrial size was normal in size. Pericardium: There is no evidence of pericardial effusion. Presence of pericardial fat pad. Mitral Valve: The mitral valve is abnormal. Mild to moderate mitral valve regurgitation. No evidence of mitral valve stenosis. MV peak gradient, 6.0 mmHg. The mean mitral valve gradient is 3.0 mmHg. Tricuspid Valve: The tricuspid valve is not well visualized. Tricuspid valve regurgitation is trivial. Aortic Valve: The aortic valve is tricuspid. Aortic valve regurgitation is not visualized. No aortic stenosis is present. Aortic valve mean gradient measures 6.0 mmHg. Aortic valve peak gradient measures 11.3 mmHg. Aortic valve area, by VTI measures 2.04  cm. Pulmonic Valve: The pulmonic valve was not well visualized. Pulmonic valve regurgitation  is trivial. No evidence of pulmonic stenosis. Aorta: The aortic root and ascending aorta are structurally normal, with no evidence of dilitation. Pulmonary Artery: The pulmonary artery is not well seen. Venous: The inferior vena cava is normal in size with less than 50% respiratory variability, suggesting right atrial pressure of 8 mmHg. IAS/Shunts: The interatrial septum was not well visualized.  LEFT VENTRICLE PLAX 2D LVIDd:         4.40 cm   Diastology LVIDs:         3.16 cm   LV e' medial:    6.31 cm/s LV PW:         0.85 cm   LV E/e' medial:  16.5 LV IVS:        0.66 cm   LV e' lateral:   7.83 cm/s LVOT diam:     1.80 cm   LV E/e' lateral: 13.3 LV SV:         63 LV SV Index:   44 LVOT Area:     2.54 cm  RIGHT VENTRICLE RV Basal diam:  2.72 cm RV S prime:     15.60 cm/s LEFT ATRIUM             Index        RIGHT ATRIUM          Index LA diam:        2.80 cm 1.96 cm/m   RA Area:     8.05 cm LA Vol (A2C):   26.3 ml 18.39 ml/m  RA Volume:   16.00 ml 11.19 ml/m LA Vol (A4C):   32.9 ml 23.00 ml/m LA Biplane Vol: 29.9 ml 20.90 ml/m  AORTIC VALVE                     PULMONIC VALVE AV Area (Vmax):    1.95 cm      PV Vmax:       1.16 m/s  AV Area (Vmean):   1.94 cm      PV Vmean:      74.500 cm/s AV Area (VTI):     2.04 cm      PV VTI:        0.167 m AV Vmax:           168.00 cm/s   PV Peak grad:  5.4 mmHg AV Vmean:          119.000 cm/s  PV Mean grad:  2.0 mmHg AV VTI:            0.309 m AV Peak Grad:      11.3 mmHg AV Mean Grad:      6.0 mmHg LVOT Vmax:         129.00 cm/s LVOT Vmean:        90.500 cm/s LVOT VTI:          0.248 m LVOT/AV VTI ratio: 0.80  AORTA Ao Root diam: 2.60 cm MITRAL VALVE MV Area (PHT): 6.96 cm     SHUNTS MV Area VTI:   2.99 cm     Systemic VTI:  0.25 m MV Peak grad:  6.0 mmHg     Systemic Diam: 1.80 cm MV Mean grad:  3.0 mmHg MV Vmax:       1.22 m/s MV Vmean:      79.0 cm/s MV Decel Time: 109 msec MV E velocity: 104.00 cm/s MV A velocity: 124.00 cm/s MV E/A ratio:  0.84 Christopher End MD Electronically signed by  Yvonne Kendall MD Signature Date/Time: 01/05/2021/10:54:03 AM    Final     Assessment/Plan: LADAWN BOULLION is a 45 y.o. 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 Admitted with falls, weakness and found to have profound anemia. Had large ulcer from NSAIDS noted on EGD. Initially WBC 30 but no fevers. BCX neg initially but now growing Candida krusei. She seems to be back to her baseline mentally. She has CT with possible subacute infarct and possible small SDH.  She denies any recent shunt issues. Unclear if she is neurologically at her baseline.  MRI is pending.    The candidemia source is most likely the gastric ulcer and translocation from the gut.  Need to eval for endocarditis as not clear how long could have been candidemic.  TTE shows abnormality on MV.  I am not convinced the shunt is infected as no peritonitis, no obvious change in her mental status however the only way to rule it out would be to tap shunt.   Recommendations Continue unasyn and eraxis. Repeat bcx pended.  MRI is pending. After  MRI would suggest tap shunt and send for cell count, diff, protein, glucose, routine and fungal cultue. Will need TEE given abnormality on TTE.  Would consult ophtho ifor detailed retinal exam given candidemia and her recent vision complaints.   Thank you very much for the consult. Will follow with you.  Mick Sell   01/06/2021, 1:11 PM

## 2021-01-06 NOTE — Progress Notes (Signed)
Patient transported to MRI via bed by [employer], orderly

## 2021-01-06 NOTE — Progress Notes (Signed)
PROGRESS NOTE    GER NICKS  IDP:[id number] DOB: May 08, 1975 DOA: 01/04/2021 PCP: Patient, No Pcp Per (Inactive)   Brief Narrative: Taken from prior notes. 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  She has not seen a neurosurgeon in 3 yrs.  She presented to [facility] ER on 10/10 via EMS from home with recurrent falls, generalized weakness, and back pain.  She is unsure if she has been hitting her head during the falls.  She also endorsed several week hx of abdominal pain and diarrhea.  She was diagnosed with COVID-19 in 08/2020, and has had worsening generalized weakness since the dx.  She has not seen a PCP for evaluation of symptoms.  She does live alone, but her aunt lives next door.  Of note pt able to notify EMS for assistance, however when they arrived at her home she was unable to answer the door.  Therefore,  EMS had to break her door down to gain entry.    On EMS arrival patient was hypotensive with systolic in 60s, heart rate 57.  Blood pressure improved with 2 L of LR bolus.  Lactic acidosis at 3.6, procalcitonin at 24.93, WBCs of 30.1, hemoglobin of 2.7, hematocrit of 10.4 and stool occult positive.  Patient was using Goody powder and ibuprofen frequently for abdominal pain.  Massive blood transfusion protocol activated on arrival to ED but patient only received 2 units of PRBC and 1 unit of FFP.  Hemoglobin improved to 11.8, doubtful that original reading was Real, might be some lab error.  Last hemoglobin in the chart was 14.4 in July 2021. She was started on Zosyn empirically for a possible intra-abdominal infection.  CT abdomen and pelvis was without any acute significant abnormality.  There was a focal peripheral hypodensity in the upper posterior aspect of the spleen, possibly a small cyst, hemangioma or a possible small splenic infarct.  No other abnormality found. Blood cultures  negative in 24 hours, COVID-19 and influenza PCR negative, MRSA swab negative. Chest x-ray without any acute abnormality.  CT head and cervical spine with new hyperdense right subdural collection, without evidence of acute blood products.  There was an underlying loss of gray-white matter differentiation in the right parietal lobe concerning for acute-subacute infarct.  No new focal deficit.  Neurology was consulted and they are recommending MRI and MRA if her shunt device is compatible. GI was also consulted and she is going for EGD today. Patient initially admitted to ICU, TRH to resume care on 01/05/2021  10/11 : patient appears to be at baseline.  Patient denies any obvious bleeding, hematemesis, melena or hematochezia.  She was having some diarrhea for some time.  She is pretty much independent for ADLs and walk with the help of walker.  Lives alone with family keep checking on her.  They are not lives next door.  She denies any fever or chills.  She denies any menorrhagia, stating that her periods are becoming more scant, she thinks she is approaching menopause. Going for EGD later today.  Blood culture came back positive for fungal infection, patient has fungemia, ID was automatically consulted and she was started on Eraxis.  Patient also has shunt in place.  Subjective: Pt complained of frequent diarrhea which caused her severe headache.  RN reported multiple episodes of black stool.     Assessment & Plan:   Active Problems:  Hemorrhagic shock (HCC)   Fall   Altered mental status   History of brain shunt  Severe sepsis 2/2 Candidemia --leukocytosis, tachycardia on presenting to ED, and lactic acidosis.  Procalcitonin markedly elevated.   --She initially received Zosyn which was transitioned to Unasyn.   --started on Eraxis for fungemia --ID consulted Plan: --cont Unasyn --cont Eraxis --tap shunt, per ID rec --will need TEE  --consider ophtho consult  Hemorrhagic shock  ruled out --Hypotension on presentation responsive to fluid resuscitation.   --patient only received 2 unit of PRBC and 1 unit of FFP with unusually significant improvement in her hemoglobin to 11.8.  Initial Hgb of 2.7 is likely a lab error.  Gastric ulcer --history of significant NSAID use. --EGD found giant gastric ulcer that was not actively bleeding.  However, pt found to have black stools today. Plan: --cont IV PPI gtt for 72 hours --soft diet, per GI rec --has trouble with pills so at discharge will need PPI in capsule form so she can break apart and take twice daily - no blood thinners unless absolutely necessary  Generalized weakness and falls.   Since prior COVID infection in June 2022. -PT/OT evaluation  Hydrocephalus, and Spina Bifida s/p VP shunt  --Neurosurgery following --will need to tap shunt, per ID rec  Abnormal CT head.   There was some concern of new hyperdense right subdural collection, without evidence of acute blood products and a concern of acute-subacute infarct in the right parietal lobe. Neurology was consulted.  No new focal deficit.  Patient has baseline less strength on left. Plan: --MRI and MRAs today  Diarrhea --likely due to abx and anti-fungal use --No active C diff infection.  GI path neg --Imodium PRN   Objective: Vitals:   01/06/21 1500 01/06/21 1600 01/06/21 1700 01/06/21 1800  BP: (!) 136/93 (!) 141/106 (!) 147/111 (!) 154/101  Pulse: 91 87 84 87  Resp: (!) Temp:      TempSrc:      SpO2: 94% 93% 95% 95%  Weight:      Height:        Intake/Output Summary (Last 24 hours) at 01/06/2021 1817 Last data filed at 01/06/2021 1800 Gross per 24 hour  Intake 772.82 ml  Output 1300 ml  Net -527.18 ml   Filed Weights   01/04/21 1657 01/05/21 0426 01/06/21 0500  Weight: 49.2 kg 48.5 kg 49.6 kg    Examination:  Constitutional: NAD, AAOx3 HEENT: conjunctivae and lids normal, EOMI CV: No cyanosis.   RESP: normal  respiratory effort, on RA Extremities: No effusions, edema in BLE SKIN: warm, dry Neuro: II - XII grossly intact.   Psych: depressed mood and affect.  Appropriate judgement and reason   DVT prophylaxis: SCDs, concern of GI bleed Code Status: Full Family Communication:  Disposition Plan:  Status is: Inpatient   Dispo: The patient is from: Home              Anticipated d/c is to: Home              Patient currently is not medically stable to d/c.  On IV abx and IV antifungal, pending neurosurgery and neuro eval.    Difficult to place patient No                 All the records are reviewed and case discussed with Care Management/Social Worker. Management plans discussed with the patient, nursing and they are in agreement.  Consultants:  GI PCCM  Procedures:  Antimicrobials:  Unasyn  Data Reviewed: I have personally reviewed following labs and imaging studies  CBC: Recent Labs  Lab 01/04/21 1205 01/04/21 1458 01/04/21 1547 01/04/21 1915 01/05/21 0132 01/05/21 0540 01/06/21 0415  WBC 30.2*  --   --   --   --  24.2* 18.7*  NEUTROABS 27.0*  --   --   --   --   --   --   HGB 2.7* 11.1*  --  11.6* 11.8* 11.8* 11.9*  HCT 10.4* 32.1*  --  33.2* 33.7* 34.7* 36.3  MCV 65.0*  --   --   --   --  75.4* 81.4  PLT 721*  --  372 406*  --  372 288   Basic Metabolic Panel: Recent Labs  Lab 01/04/21 1205 01/05/21 0540 01/06/21 0415  NA 135 138 141  K 3.4* 4.1 4.6  CL 103 107 109  CO2 23 21* 23  GLUCOSE 144* 122* 124*  BUN 36* 21* 13  CREATININE 0.86 0.75 0.71  CALCIUM 8.0* 8.1* 8.6*  MG  --  2.0  --   PHOS  --  2.9  --    GFR: Estimated Creatinine Clearance: 64.5 mL/min (by C-G formula based on SCr of 0.71 mg/dL). Liver Function Tests: Recent Labs  Lab 01/04/21 1205  AST 20  ALT 13  ALKPHOS 82  BILITOT 0.6  PROT 5.5*  ALBUMIN 2.1*   Recent Labs  Lab 01/04/21 1205  LIPASE 50   No results for input(s): AMMONIA in the last 168 hours. Coagulation  Profile: Recent Labs  Lab 01/04/21 1205 01/04/21 1547 01/04/21 1915 01/05/21 0540  INR 1.2 1.2 1.1 1.1   Cardiac Enzymes: Recent Labs  Lab 01/04/21 1205  CKTOTAL 85   BNP (last 3 results) No results for input(s): PROBNP in the last 8760 hours. HbA1C: Recent Labs    01/05/21 0540  HGBA1C 5.4   CBG: Recent Labs  Lab 01/04/21 1704  GLUCAP 100*   Lipid Profile: Recent Labs    01/05/21 0540  CHOL 105  HDL 15*  LDLCALC 45  TRIG 161*  CHOLHDL 7.0   Thyroid Function Tests: No results for input(s): TSH, T4TOTAL, FREET4, T3FREE, THYROIDAB in the last 72 hours. Anemia Panel: Recent Labs    01/04/21 1458 01/04/21 1547 01/04/21 1915 01/05/21 0538 01/05/21 0539 01/05/21 1417  VITAMINB12  --   --  364  --  269  --   FOLATE  --  21.5  --  8.0  --   --   FERRITIN  --  21  --  28  --   --   TIBC  --  255  --  322  --   --   IRON  --  53  --  31  --   --   RETICCTPCT 1.8  --   --   --   --  2.0   Sepsis Labs: Recent Labs  Lab 01/04/21 1205 01/04/21 1547 01/05/21 0538 01/05/21 0540 01/06/21 0415  PROCALCITON 24.93  --  10.66  --   --   LATICACIDVEN 3.6* 2.5*  --  2.1* 1.2    Recent Results (from the past 240 hour(s))  Resp Panel by RT-PCR (Flu A&B, Covid) Nasopharyngeal Swab     Status: None   Collection Time: 01/04/21 12:05 PM   Specimen: Nasopharyngeal Swab; Nasopharyngeal(NP) swabs in vial transport medium  Result Value Ref Range Status   SARS Coronavirus 2 by RT  PCR NEGATIVE NEGATIVE Final    Comment: (NOTE) SARS-CoV-2 target nucleic acids are NOT DETECTED.  The SARS-CoV-2 RNA is generally detectable in upper respiratory specimens during the acute phase of infection. The lowest concentration of SARS-CoV-2 viral copies this assay can detect is 138 copies/mL. A negative result does not preclude SARS-Cov-2 infection and should not be used as the sole basis for treatment or other patient management decisions. A negative result may occur with  improper  specimen collection/handling, submission of specimen other than nasopharyngeal swab, presence of viral mutation(s) within the areas targeted by this assay, and inadequate number of viral copies(<138 copies/mL). A negative result must be combined with clinical observations, patient history, and epidemiological information. The expected result is Negative.  Fact Sheet for Patients:  BloggerCourse.com  Fact Sheet for Healthcare Providers:  SeriousBroker.it  This test is no t yet approved or cleared by the [place] FDA and  has been authorized for detection and/or diagnosis of SARS-CoV-2 by FDA under an Emergency Use Authorization (EUA). This EUA will remain  in effect (meaning this test can be used) for the duration of the COVID-19 declaration under Section 564(b)(1) of the Act, 21 U.S.C.section 360bbb-3(b)(1), unless the authorization is terminated  or revoked sooner.       Influenza A by PCR NEGATIVE NEGATIVE Final   Influenza B by PCR NEGATIVE NEGATIVE Final    Comment: (NOTE) The Xpert Xpress SARS-CoV-2/FLU/RSV plus assay is intended as an aid in the diagnosis of influenza from Nasopharyngeal swab specimens and should not be used as a sole basis for treatment. Nasal washings and aspirates are unacceptable for Xpert Xpress SARS-CoV-2/FLU/RSV testing.  Fact Sheet for Patients: BloggerCourse.com  Fact Sheet for Healthcare Providers: SeriousBroker.it  This test is not yet approved or cleared by the [place] FDA and has been authorized for detection and/or diagnosis of SARS-CoV-2 by FDA under an Emergency Use Authorization (EUA). This EUA will remain in effect (meaning this test can be used) for the duration of the COVID-19 declaration under Section 564(b)(1) of the Act, 21 U.S.C. section 360bbb-3(b)(1), unless the authorization is terminated or revoked.  Performed at  [facility], [address] [place], [place] [address]   Blood culture (routine single)     Status: None (Preliminary result)   Collection Time: 01/04/21  2:58 PM   Specimen: BLOOD  Result Value Ref Range Status   Specimen Description   Final    BLOOD LEFT ANTECUBITAL Performed at [facility], [address] [place], [place] [address]    Special Requests   Final    BOTTLES DRAWN AEROBIC AND ANAEROBIC Blood Culture adequate volume Performed at [facility], [address] [place], [place] [address]    Culture  Setup Time   Final    YEAST ANAEROBIC BOTTLE ONLY Organism ID to follow CRITICAL RESULT CALLED TO, READ BACK BY AND VERIFIED WITH: SAMANTHA RAUER 01/05/21 1507 SLM IN BOTH AEROBIC AND ANAEROBIC BOTTLES Performed at [facility], [address] [place], [place] [address]    Culture   Final    CULTURE REINCUBATED FOR BETTER GROWTH Performed at [facility], [address], [place], [place] [address]    Report Status PENDING  Incomplete  Blood Culture ID Panel (Reflexed)     Status: Abnormal   Collection Time: 01/04/21  2:58 PM  Result Value Ref Range Status   Enterococcus faecalis NOT DETECTED NOT DETECTED Final   Enterococcus Faecium NOT DETECTED NOT DETECTED Final  Listeria monocytogenes NOT DETECTED NOT DETECTED Final   Staphylococcus species NOT DETECTED NOT DETECTED Final   Staphylococcus aureus (BCID) NOT DETECTED NOT DETECTED Final   Staphylococcus epidermidis NOT DETECTED NOT DETECTED Final   Staphylococcus lugdunensis NOT DETECTED NOT DETECTED Final   Streptococcus species NOT DETECTED NOT DETECTED Final   Streptococcus agalactiae NOT DETECTED NOT DETECTED Final   Streptococcus pneumoniae NOT DETECTED NOT DETECTED Final   Streptococcus pyogenes NOT DETECTED NOT DETECTED Final   A.calcoaceticus-baumannii NOT DETECTED NOT DETECTED Final   Bacteroides fragilis NOT DETECTED NOT DETECTED Final   Enterobacterales  NOT DETECTED NOT DETECTED Final   Enterobacter cloacae complex NOT DETECTED NOT DETECTED Final   Escherichia coli NOT DETECTED NOT DETECTED Final   Klebsiella aerogenes NOT DETECTED NOT DETECTED Final   Klebsiella oxytoca NOT DETECTED NOT DETECTED Final   Klebsiella pneumoniae NOT DETECTED NOT DETECTED Final   Proteus species NOT DETECTED NOT DETECTED Final   Salmonella species NOT DETECTED NOT DETECTED Final   Serratia marcescens NOT DETECTED NOT DETECTED Final   Haemophilus influenzae NOT DETECTED NOT DETECTED Final   Neisseria meningitidis NOT DETECTED NOT DETECTED Final   Pseudomonas aeruginosa NOT DETECTED NOT DETECTED Final   Stenotrophomonas maltophilia NOT DETECTED NOT DETECTED Final   Candida albicans NOT DETECTED NOT DETECTED Final   Candida auris NOT DETECTED NOT DETECTED Final   Candida glabrata NOT DETECTED NOT DETECTED Final   Candida krusei DETECTED (A) NOT DETECTED Final    Comment: CRITICAL RESULT CALLED TO, READ BACK BY AND VERIFIED WITH: SAMANTHA RAUER 01/05/21 1507 SLM    Candida parapsilosis NOT DETECTED NOT DETECTED Final   Candida tropicalis NOT DETECTED NOT DETECTED Final   Cryptococcus neoformans/gattii NOT DETECTED NOT DETECTED Final    Comment: Performed at [facility], [address] [place], [place] [address]  MRSA Next Gen by PCR, Nasal     Status: None   Collection Time: 01/04/21  4:58 PM   Specimen: Nasal Mucosa; Nasal Swab  Result Value Ref Range Status   MRSA by PCR Next Gen NOT DETECTED NOT DETECTED Final    Comment: (NOTE) The GeneXpert MRSA Assay (FDA approved for NASAL specimens only), is one component of a comprehensive MRSA colonization surveillance program. It is not intended to diagnose MRSA infection nor to guide or monitor treatment for MRSA infections. Test performance is not FDA approved in patients less than 62 years old. Performed at [facility], [address] [place], [place] [address]   Gastrointestinal  Panel by PCR , Stool     Status: None   Collection Time: 01/05/21  3:01 PM   Specimen: Stool  Result Value Ref Range Status   Campylobacter species NOT DETECTED NOT DETECTED Final   Plesimonas shigelloides NOT DETECTED NOT DETECTED Final   Salmonella species NOT DETECTED NOT DETECTED Final   Yersinia enterocolitica NOT DETECTED NOT DETECTED Final   Vibrio species NOT DETECTED NOT DETECTED Final   Vibrio cholerae NOT DETECTED NOT DETECTED Final   Enteroaggregative E coli (EAEC) NOT DETECTED NOT DETECTED Final   Enteropathogenic E coli (EPEC) NOT DETECTED NOT DETECTED Final   Enterotoxigenic E coli (ETEC) NOT DETECTED NOT DETECTED Final   Shiga like toxin producing E coli (STEC) NOT DETECTED NOT DETECTED Final   Shigella/Enteroinvasive E coli (EIEC) NOT DETECTED NOT DETECTED Final   Cryptosporidium NOT DETECTED NOT DETECTED Final   Cyclospora cayetanensis NOT DETECTED NOT DETECTED Final   Entamoeba histolytica NOT DETECTED NOT DETECTED Final  Giardia lamblia NOT DETECTED NOT DETECTED Final   Adenovirus F40/41 NOT DETECTED NOT DETECTED Final   Astrovirus NOT DETECTED NOT DETECTED Final   Norovirus GI/GII NOT DETECTED NOT DETECTED Final   Rotavirus A NOT DETECTED NOT DETECTED Final   Sapovirus (I, II, IV, and V) NOT DETECTED NOT DETECTED Final    Comment: Performed at [facility], [address] [place], [place] [address]  C Difficile Quick Screen w PCR reflex     Status: Abnormal   Collection Time: 01/05/21  3:01 PM   Specimen: STOOL  Result Value Ref Range Status   C Diff antigen POSITIVE (A) NEGATIVE Final   C Diff toxin NEGATIVE NEGATIVE Final   C Diff interpretation Results are indeterminate. See PCR results.  Final    Comment: Performed at [facility], [address] [place], [place] [address]  C. Diff by PCR, Reflexed     Status: None   Collection Time: 01/05/21  3:01 PM  Result Value Ref Range Status   Toxigenic C. Difficile by PCR NEGATIVE NEGATIVE  Final    Comment: Patient is colonized with non toxigenic C. difficile. May not need treatment unless significant symptoms are present. Performed at [facility], [address] [place], [place] [address]   Culture, blood (single)     Status: None (Preliminary result)   Collection Time: 01/05/21  4:42 PM   Specimen: BLOOD  Result Value Ref Range Status   Specimen Description BLOOD BLOOD LEFT HAND  Final   Special Requests   Final    BOTTLES DRAWN AEROBIC AND ANAEROBIC Blood Culture adequate volume   Culture   Final    NO GROWTH < 24 HOURS Performed at [facility], [address], [place], [place] [address]    Report Status PENDING  Incomplete     Radiology Studies: DG Skull 1-3 Views  Result Date: 01/04/2021 CLINICAL DATA:  Evaluation for shunt EXAM: SKULL - 1-3 VIEW COMPARISON:  01/04/2021 CT head. FINDINGS: There is no evidence of skull fracture or other focal bone lesions. Right frontal approach ventriculostomy catheter, with shunt set at 110 mm H2O. No evidence of shunt discontinuity or kinking. IMPRESSION: Ventriculostomy catheter, with shunt set 110 mm of H2O, without evidence of shunt discontinuity or kinking. Electronically Signed   By: Wiliam Ke M.D.   On: 01/04/2021 18:47   MR ANGIO HEAD WO CONTRAST  Result Date: 01/06/2021 CLINICAL DATA:  Stroke follow-up EXAM: MRI HEAD WITHOUT AND WITH CONTRAST MRA HEAD WITHOUT CONTRAST MRA NECK WITHOUT AND WITH CONTRAST TECHNIQUE: Multiplanar, multiecho pulse sequences of the brain and surrounding structures were obtained without and with intravenous contrast. Angiographic images of the head were obtained using MRA technique without contrast. Angiographic images of the neck were acquired using MRA technique without and with intravenous contrast. Carotid stenosis measurements (when applicable) are obtained utilizing NASCET criteria, using the distal internal carotid diameter as the denominator. CONTRAST:  65mL GADAVIST  GADOBUTROL 1 MMOL/ML IV SOLN COMPARISON:  01/04/2021 and 10/12/2019 CT head, no prior MRI FINDINGS: MRI HEAD FINDINGS Brain: Evaluation is somewhat limited by susceptibility artifact from overlying shunt hardware. 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 Tethering of a portion of the right lateral parietal lobe, extending to the calvarium and the location of a prior right parietal burr hole (series 16, image 96). Possible area of restricted diffusion in the posterior right temporal lobe (series  5, image 25). Additional areas of increased signal on diffusion-weighted imaging are without definite ADC correlate and may represent remote infarcts. Fluid collection measuring up to 6 mm along the right parietal convexity in this area, without evidence of hemorrhage or proteinaceous fluid; the fluid collection follows CSF signal on all sequences. Mild diffuse dural thickening. No definite abnormal enhancement. Right frontal approach ventriculostomy catheter with tip approximating the body of the left lateral ventricle. No hydrocephalus. Status post prior suboccipital craniectomy with unchanged appearance of the cerebellum and tonsils compared to Vascular: See MRA findings, below. Skull and upper cervical spine: Status post suboccipital craniectomy. Right frontal and parietal burr holes. Otherwise normal marrow signal. Sinuses/Orbits: Negative. Other: The mastoids are well aerated. MRA HEAD FINDINGS No definite flow seen in the right intracranial internal carotid artery, with flow reconstituting in the supraclinoid segment. The right intracranial internal carotid artery is patent to the terminus, without stenosis or other abnormality. A1 segments patent, although there is somewhat poor flow in the right A1. Anterior communicating artery not well visualized. Possible fusiform aneurysmal dilatation of the  proximal right A2 (series 1050, image 108), although this may be artifactual. Anterior cerebral arteries are patent to their distal aspects. Normal left MCA, with distal branches well perfused. The right M1 is somewhat irregular; although the distal branches appear perfused, they are less perfused than the left MCA branches. Right vertebral artery is patent to the vertebrobasilar junction without stenosis. Variant branching at the distal basilar artery, which bifurcates before giving off the PCA and SCA bilaterally. Distal PCA branches appear well perfused. MRA NECK FINDINGS Aortic arch: Standard branching. No evidence of dissection or stenosis. Right carotid system: Severe focal stenosis at the origin of the ICA (series 1107, image 38), with occlusion slightly more distally. The right ICA is not definitively seen for the remainder of extracranial course. Left carotid system: Patent without significant stenosis peer Vertebral arteries: The right vertebral artery is well visualized and appears patent, without hemodynamically significant stenosis. The left vertebral artery is not visualized from just distal to its origin lesion. Other: None IMPRESSION: 1. Tethering of a segment of the right parietal cortex to a right parietal burr hole. 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 Small focus of restricted diffusion within the posterior right temporal lobe may reflect a small more acute infarct. 2. Dural thickening, suggestive of intracranial hypotension, possibly due to over shunting. This could also explain the apparent right parietal convexity extra-axial collection, which would accentuate and exacerbate the tethering. 3. Non opacification of the majority of the extra and intracranial right ICA, with reconstitution at the supraclinoid segment, likely via collaterals. Poor  flow in the right ACA and MCA, with possible fusiform dilatation of the right A2 segment. Consider CTA for better evaluation. 4. Non opacification of the left vertebral artery. These results were called by telephone at the time of interpretation on 01/06/2021 at 1:40 pm to provider [facility] , who verbally acknowledged these results. Electronically Signed   By: Wiliam Ke M.D.   On: 01/06/2021 13:42   MR BRAIN W WO CONTRAST  Result Date: 01/06/2021 CLINICAL DATA:  Stroke follow-up EXAM: MRI HEAD WITHOUT AND WITH CONTRAST MRA HEAD WITHOUT CONTRAST MRA NECK WITHOUT AND WITH CONTRAST TECHNIQUE: Multiplanar, multiecho pulse sequences of the brain and surrounding structures were obtained without and with intravenous contrast. Angiographic images of the head  were obtained using MRA technique without contrast. Angiographic images of the neck were acquired using MRA technique without and with intravenous contrast. Carotid stenosis measurements (when applicable) are obtained utilizing NASCET criteria, using the distal internal carotid diameter as the denominator. CONTRAST:  5mL GADAVIST GADOBUTROL 1 MMOL/ML IV SOLN COMPARISON:  01/04/2021 and 10/12/2019 CT head, no prior MRI FINDINGS: MRI HEAD FINDINGS Brain: Evaluation is somewhat limited by susceptibility artifact from overlying shunt hardware. 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 Tethering of a portion of the right lateral parietal lobe, extending to the calvarium and the location of a prior right parietal burr hole (series 16, image 96). Possible area of restricted diffusion in the posterior right temporal lobe (series 5, image 25). Additional areas of increased signal on diffusion-weighted imaging are without definite ADC correlate and may represent remote infarcts. Fluid collection measuring up to 6 mm along the right parietal  convexity in this area, without evidence of hemorrhage or proteinaceous fluid; the fluid collection follows CSF signal on all sequences. Mild diffuse dural thickening. No definite abnormal enhancement. Right frontal approach ventriculostomy catheter with tip approximating the body of the left lateral ventricle. No hydrocephalus. Status post prior suboccipital craniectomy with unchanged appearance of the cerebellum and tonsils compared to Vascular: See MRA findings, below. Skull and upper cervical spine: Status post suboccipital craniectomy. Right frontal and parietal burr holes. Otherwise normal marrow signal. Sinuses/Orbits: Negative. Other: The mastoids are well aerated. MRA HEAD FINDINGS No definite flow seen in the right intracranial internal carotid artery, with flow reconstituting in the supraclinoid segment. The right intracranial internal carotid artery is patent to the terminus, without stenosis or other abnormality. A1 segments patent, although there is somewhat poor flow in the right A1. Anterior communicating artery not well visualized. Possible fusiform aneurysmal dilatation of the proximal right A2 (series 1050, image 108), although this may be artifactual. Anterior cerebral arteries are patent to their distal aspects. Normal left MCA, with distal branches well perfused. The right M1 is somewhat irregular; although the distal branches appear perfused, they are less perfused than the left MCA branches. Right vertebral artery is patent to the vertebrobasilar junction without stenosis. Variant branching at the distal basilar artery, which bifurcates before giving off the PCA and SCA bilaterally. Distal PCA branches appear well perfused. MRA NECK FINDINGS Aortic arch: Standard branching. No evidence of dissection or stenosis. Right carotid system: Severe focal stenosis at the origin of the ICA (series 1107, image 38), with occlusion slightly more distally. The right ICA is not definitively seen for the  remainder of extracranial course. Left carotid system: Patent without significant stenosis peer Vertebral arteries: The right vertebral artery is well visualized and appears patent, without hemodynamically significant stenosis. The left vertebral artery is not visualized from just distal to its origin lesion. Other: None IMPRESSION: 1. Tethering of a segment of the right parietal cortex to a right parietal burr hole. 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 Small focus of restricted diffusion within the posterior right temporal lobe may reflect a small more acute infarct. 2. Dural thickening, suggestive of intracranial hypotension, possibly due to over shunting. This could also explain the apparent right parietal convexity extra-axial collection, which would accentuate and exacerbate the tethering. 3. Non opacification of the majority of the extra and intracranial  right ICA, with reconstitution at the supraclinoid segment, likely via collaterals. Poor flow in the right ACA and MCA, with possible fusiform dilatation of the right A2 segment. Consider CTA for better evaluation. 4. Non opacification of the left vertebral artery. These results were called by telephone at the time of interpretation on 01/06/2021 at 1:40 pm to provider MCNEILL [facility] , who verbally acknowledged these results. Electronically Signed   By: Wiliam Ke M.D.   On: 01/06/2021 13:42   ECHOCARDIOGRAM COMPLETE  Result Date: 01/05/2021    ECHOCARDIOGRAM REPORT   Patient Name:   OZELLA COMINS Date of Exam: 01/05/2021 Medical Rec #:  [id number]        Height:       60.0 in Accession #:    [id number]       Weight:       106.9 lb Date of Birth:  Oct 17, 1975       BSA:          1.430 m Patient Age:    44 years         BP:           157/111 mmHg Patient Gender: F                HR:           97  bpm. Exam Location:  [facility] Procedure: 2D Echo, Color Doppler and Cardiac Doppler Indications:     I63.9 Stroke  History:         Patient has no prior history of Echocardiogram examinations.                  Risk Factors:Hypertension.  Sonographer:     Humphrey Rolls Referring Phys:  [phone] MCNEILL P KIRKPATRICK Diagnosing Phys: Yvonne Kendall MD  Sonographer Comments: Suboptimal subcostal window. IMPRESSIONS  1. Left ventricular ejection fraction, by estimation, is 55 to 60%. The left ventricle has normal function. The left ventricle has no regional wall motion abnormalities. Left ventricular diastolic parameters are consistent with Grade I diastolic dysfunction (impaired relaxation). Elevated left atrial pressure.  2. Right ventricular systolic function is normal. The right ventricular size is normal.  3. The mitral valve is abnormal. Cannot exclude prolapse of posterior leaflet or cleft. There is at least mild to moderate mitral valve regurgitation. No evidence of mitral stenosis.  4. The aortic valve is tricuspid. Aortic valve regurgitation is not visualized. No aortic stenosis is present.  5. The inferior vena cava is normal in size with <50% respiratory variability, suggesting right atrial pressure of 8 mmHg. FINDINGS  Left Ventricle: Left ventricular ejection fraction, by estimation, is 55 to 60%. The left ventricle has normal function. The left ventricle has no regional wall motion abnormalities. The left ventricular internal cavity size was normal in size. There is  no left ventricular hypertrophy. Left ventricular diastolic parameters are consistent with Grade I diastolic dysfunction (impaired relaxation). Elevated left atrial pressure. Right Ventricle: The right ventricular size is normal. No increase in right ventricular wall thickness. Right ventricular systolic function is normal. Left Atrium: Left atrial size was normal in size. Right Atrium: Right atrial size was normal in size. Pericardium: There is no  evidence of pericardial effusion. Presence of pericardial fat pad. Mitral Valve: The mitral valve is abnormal. Mild to moderate mitral valve regurgitation. No evidence of mitral valve stenosis. MV peak gradient, 6.0 mmHg. The mean mitral valve gradient is 3.0 mmHg. Tricuspid Valve: The tricuspid valve is not well visualized. Tricuspid  valve regurgitation is trivial. Aortic Valve: The aortic valve is tricuspid. Aortic valve regurgitation is not visualized. No aortic stenosis is present. Aortic valve mean gradient measures 6.0 mmHg. Aortic valve peak gradient measures 11.3 mmHg. Aortic valve area, by VTI measures 2.04  cm. Pulmonic Valve: The pulmonic valve was not well visualized. Pulmonic valve regurgitation is trivial. No evidence of pulmonic stenosis. Aorta: The aortic root and ascending aorta are structurally normal, with no evidence of dilitation. Pulmonary Artery: The pulmonary artery is not well seen. Venous: The inferior vena cava is normal in size with less than 50% respiratory variability, suggesting right atrial pressure of 8 mmHg. IAS/Shunts: The interatrial septum was not well visualized.  LEFT VENTRICLE PLAX 2D LVIDd:         4.40 cm   Diastology LVIDs:         3.16 cm   LV e' medial:    6.31 cm/s LV PW:         0.85 cm   LV E/e' medial:  16.5 LV IVS:        0.66 cm   LV e' lateral:   7.83 cm/s LVOT diam:     1.80 cm   LV E/e' lateral: 13.3 LV SV:         63 LV SV Index:   44 LVOT Area:     2.54 cm  RIGHT VENTRICLE RV Basal diam:  2.72 cm RV S prime:     15.60 cm/s LEFT ATRIUM             Index        RIGHT ATRIUM          Index LA diam:        2.80 cm 1.96 cm/m   RA Area:     8.05 cm LA Vol (A2C):   26.3 ml 18.39 ml/m  RA Volume:   16.00 ml 11.19 ml/m LA Vol (A4C):   32.9 ml 23.00 ml/m LA Biplane Vol: 29.9 ml 20.90 ml/m  AORTIC VALVE                     PULMONIC VALVE AV Area (Vmax):    1.95 cm      PV Vmax:       1.16 m/s AV Area (Vmean):   1.94 cm      PV Vmean:      74.500 cm/s AV Area  (VTI):     2.04 cm      PV VTI:        0.167 m AV Vmax:           168.00 cm/s   PV Peak grad:  5.4 mmHg AV Vmean:          119.000 cm/s  PV Mean grad:  2.0 mmHg AV VTI:            0.309 m AV Peak Grad:      11.3 mmHg AV Mean Grad:      6.0 mmHg LVOT Vmax:         129.00 cm/s LVOT Vmean:        90.500 cm/s LVOT VTI:          0.248 m LVOT/AV VTI ratio: 0.80  AORTA Ao Root diam: 2.60 cm MITRAL VALVE MV Area (PHT): 6.96 cm     SHUNTS MV Area VTI:   2.99 cm     Systemic VTI:  0.25 m MV Peak grad:  6.0 mmHg  Systemic Diam: 1.80 cm MV Mean grad:  3.0 mmHg MV Vmax:       1.22 m/s MV Vmean:      79.0 cm/s MV Decel Time: 109 msec MV E velocity: 104.00 cm/s MV A velocity: 124.00 cm/s MV E/A ratio:  0.84 Christopher End MD Electronically signed by Yvonne Kendall MD Signature Date/Time: 01/05/2021/10:54:03 AM    Final     Scheduled Meds:  budesonide (PULMICORT) nebulizer solution  0.5 mg Nebulization BID   Chlorhexidine Gluconate Cloth  6 each Topical Q0600   methylPREDNISolone (SOLU-MEDROL) injection  20 mg Intravenous Q12H   [START ON 01/08/2021] pantoprazole  40 mg Intravenous Q12H   Continuous Infusions:  ampicillin-sulbactam (UNASYN) IV 200 mL/hr at 01/06/21 1800   anidulafungin Stopped (01/06/21 1603)   pantoprazole 8 mg/hr (01/06/21 1800)     LOS: 2 days    Darlin Priestly, MD [facility]  If 7PM-7AM, please contact night-coverage [email or web address]  01/06/2021, 6:17 PM

## 2021-01-06 NOTE — Progress Notes (Signed)
Patient has had 4 liquid black stools within 1 hour 14:00-15:00.  Dr. Fran Lowes notified.  Patient with stable blood pressure and complained of cramping and abdominal pain prior to bowel movement.  15:20 Patient is now resting in bed with eyes closed and denies any abdominal pain

## 2021-01-06 NOTE — Consult Note (Signed)
PHARMACY CONSULT NOTE - FOLLOW UP  Pharmacy Consult for Electrolyte Monitoring and Replacement   Recent Labs: Potassium (mmol/L)  Date Value  01/06/2021 4.6  02/11/2013 5.1   Magnesium (mg/dL)  Date Value  19/14/7829 2.0   Calcium (mg/dL)  Date Value  56/21/3086 8.6 (L)   Calcium, Total (mg/dL)  Date Value  57/84/6962 9.1   Albumin (g/dL)  Date Value  95/28/4132 2.1 (L)  02/11/2013 3.5   Phosphorus (mg/dL)  Date Value  44/03/270 2.9   Sodium (mmol/L)  Date Value  01/06/2021 141  02/11/2013 138   Corrected Ca: 9.5  Assessment: 45yo F with PMH of HTN, hydrocephalus and spina bifida status post VP shunt with cervical tethered cord release in 2011 last revised in 2019 who was admitted for fall. Pharmacy is consulted for electrolyte replacement.   K: 4.1>4.6 Phos 2.9>NNL Mg 2.0>NNL Scr: 0.75>0.71  Goal of Therapy:  Electrolytes within normal limits   Plan:  Renal fxn stable; all lytes WNL today. No further repletion at this time.. Will f/u with AM labs and assess need for additional replacement  Martyn Malay, PharmD, [facility] Clinical Pharmacist 01/06/2021 8:22 AM

## 2021-01-07 ENCOUNTER — Inpatient Hospital Stay: Payer: Medicaid Other

## 2021-01-07 ENCOUNTER — Encounter: Payer: Self-pay | Admitting: Gastroenterology

## 2021-01-07 DIAGNOSIS — T859XXS Unspecified complication of internal prosthetic device, implant and graft, sequela: Secondary | ICD-10-CM

## 2021-01-07 DIAGNOSIS — I38 Endocarditis, valve unspecified: Secondary | ICD-10-CM

## 2021-01-07 DIAGNOSIS — B379 Candidiasis, unspecified: Secondary | ICD-10-CM

## 2021-01-07 DIAGNOSIS — L8990 Pressure ulcer of unspecified site, unspecified stage: Secondary | ICD-10-CM | POA: Insufficient documentation

## 2021-01-07 DIAGNOSIS — B377 Candidal sepsis: Principal | ICD-10-CM

## 2021-01-07 DIAGNOSIS — R4182 Altered mental status, unspecified: Secondary | ICD-10-CM | POA: Diagnosis not present

## 2021-01-07 DIAGNOSIS — I059 Rheumatic mitral valve disease, unspecified: Secondary | ICD-10-CM

## 2021-01-07 LAB — TYPE AND SCREEN
ABO/RH(D): O POS
Antibody Screen: NEGATIVE
Unit division: 0
Unit division: 0
Unit division: 0
Unit division: 0
Unit division: 0
Unit division: 0
Unit division: 0
Unit division: 0

## 2021-01-07 LAB — BPAM FFP
Blood Product Expiration Date: 202210102359
Blood Product Expiration Date: 202210102359
Blood Product Expiration Date: 202210102359
ISSUE DATE / TIME: 202210101500
Unit Type and Rh: 6200
Unit Type and Rh: 6200
Unit Type and Rh: 6200

## 2021-01-07 LAB — PREPARE FRESH FROZEN PLASMA
Unit division: 0
Unit division: 0
Unit division: 0

## 2021-01-07 LAB — CSF CELL COUNT WITH DIFFERENTIAL
Eosinophils, CSF: 0 %
Lymphs, CSF: 20 %
Monocyte-Macrophage-Spinal Fluid: 60 %
RBC Count, CSF: 0 /mm3 (ref 0–3)
Segmented Neutrophils-CSF: 20 %
Tube #: 3
WBC, CSF: 21 /mm3 (ref 0–5)

## 2021-01-07 LAB — BPAM RBC
Blood Product Expiration Date: 202210122359
Blood Product Expiration Date: 202210132359
Blood Product Expiration Date: 202211012359
Blood Product Expiration Date: 202211042359
Blood Product Expiration Date: 202211132359
Blood Product Expiration Date: 202211132359
Blood Product Expiration Date: 202211132359
Blood Product Expiration Date: 202211132359
ISSUE DATE / TIME: 202210101400
ISSUE DATE / TIME: 202210101400
ISSUE DATE / TIME: 202210101420
ISSUE DATE / TIME: 202210101420
Unit Type and Rh: 5100
Unit Type and Rh: 5100
Unit Type and Rh: 5100
Unit Type and Rh: 5100
Unit Type and Rh: 9500
Unit Type and Rh: 9500
Unit Type and Rh: 9500
Unit Type and Rh: 9500

## 2021-01-07 LAB — BASIC METABOLIC PANEL
Anion gap: 8 (ref 5–15)
BUN: 15 mg/dL (ref 6–20)
CO2: 24 mmol/L (ref 22–32)
Calcium: 8.1 mg/dL — ABNORMAL LOW (ref 8.9–10.3)
Chloride: 104 mmol/L (ref 98–111)
Creatinine, Ser: 0.62 mg/dL (ref 0.44–1.00)
GFR, Estimated: >60 mL/min (ref >60)
Glucose, Bld: 110 mg/dL — ABNORMAL HIGH (ref 70–99)
Potassium: 4.2 mmol/L (ref 3.5–5.1)
Sodium: 136 mmol/L (ref 135–145)

## 2021-01-07 LAB — CBC
HCT: 29.4 % — ABNORMAL LOW (ref 36.0–46.0)
Hemoglobin: 9.4 g/dL — ABNORMAL LOW (ref 12.0–15.0)
MCH: 25.8 pg — ABNORMAL LOW (ref 26.0–34.0)
MCHC: 32 g/dL (ref 30.0–36.0)
MCV: 80.5 fL (ref 80.0–100.0)
Platelets: 226 10*3/uL (ref 150–400)
RBC: 3.65 MIL/uL — ABNORMAL LOW (ref 3.87–5.11)
RDW: 23.8 % — ABNORMAL HIGH (ref 11.5–15.5)
WBC: 11.3 10*3/uL — ABNORMAL HIGH (ref 4.0–10.5)
nRBC: 1.1 % — ABNORMAL HIGH (ref 0.0–0.2)

## 2021-01-07 LAB — URINE CULTURE: Culture: <10000 — AB

## 2021-01-07 LAB — PHOSPHORUS: Phosphorus: 2.9 mg/dL (ref 2.5–4.6)

## 2021-01-07 LAB — GLUCOSE, CSF: Glucose, CSF: 66 mg/dL (ref 40–70)

## 2021-01-07 LAB — MAGNESIUM: Magnesium: 2 mg/dL (ref 1.7–2.4)

## 2021-01-07 LAB — PROTEIN, CSF: Total  Protein, CSF: 98 mg/dL — ABNORMAL HIGH (ref 15–45)

## 2021-01-07 MED ORDER — TRAMADOL HCL 50 MG PO TABS
50.0000 mg | ORAL_TABLET | Freq: Four times a day (QID) | ORAL | Status: DC | PRN
  Administered 2021-01-07 – 2021-01-09 (×2): 50 mg via ORAL
  Filled 2021-01-07 (×2): qty 1

## 2021-01-07 MED ORDER — LIDOCAINE HCL (PF) 1 % IJ SOLN
10.0000 mL | Freq: Once | INTRAMUSCULAR | Status: AC
  Administered 2021-01-07: 15:00:00 5 mL
  Filled 2021-01-07: qty 10

## 2021-01-07 NOTE — Progress Notes (Signed)
[facility] CLINIC INFECTIOUS DISEASE PROGRESS NOTE Date of Admission:  01/04/2021     ID: Carolyn Herring is a 45 y.o. female with  Candidemia Active Problems:   Hemorrhagic shock (HCC)   Fall   Altered mental status   History of brain shunt   Subjective: No fevers, remains alert. Had MRI today  ROS  Eleven systems are reviewed and negative except per hpi  Medications:  Antibiotics Given (last 72 hours)     Date/Time Action Medication Dose Rate   01/04/21 1520 New Bag/Given   piperacillin-tazobactam (ZOSYN) IVPB 3.375 g 3.375 g 100 mL/hr   01/04/21 2204 New Bag/Given   Ampicillin-Sulbactam (UNASYN) 3 g in sodium chloride 0.9 % 100 mL IVPB 3 g 200 mL/hr   01/05/21 0426 New Bag/Given   Ampicillin-Sulbactam (UNASYN) 3 g in sodium chloride 0.9 % 100 mL IVPB 3 g 200 mL/hr   01/05/21 0904 New Bag/Given   Ampicillin-Sulbactam (UNASYN) 3 g in sodium chloride 0.9 % 100 mL IVPB 3 g 200 mL/hr   01/05/21 1501 New Bag/Given   Ampicillin-Sulbactam (UNASYN) 3 g in sodium chloride 0.9 % 100 mL IVPB 3 g 200 mL/hr   01/05/21 2141 New Bag/Given   Ampicillin-Sulbactam (UNASYN) 3 g in sodium chloride 0.9 % 100 mL IVPB 3 g 200 mL/hr   01/06/21 0309 New Bag/Given   Ampicillin-Sulbactam (UNASYN) 3 g in sodium chloride 0.9 % 100 mL IVPB 3 g 200 mL/hr   01/06/21 1046 New Bag/Given   Ampicillin-Sulbactam (UNASYN) 3 g in sodium chloride 0.9 % 100 mL IVPB 3 g 200 mL/hr   01/06/21 1752 New Bag/Given   Ampicillin-Sulbactam (UNASYN) 3 g in sodium chloride 0.9 % 100 mL IVPB 3 g 200 mL/hr   01/06/21 2213 New Bag/Given   Ampicillin-Sulbactam (UNASYN) 3 g in sodium chloride 0.9 % 100 mL IVPB 3 g 200 mL/hr   01/07/21 0456 New Bag/Given   Ampicillin-Sulbactam (UNASYN) 3 g in sodium chloride 0.9 % 100 mL IVPB 3 g 200 mL/hr       budesonide (PULMICORT) nebulizer solution  0.5 mg Nebulization BID   Chlorhexidine Gluconate Cloth  6 each Topical Q0600   methylPREDNISolone (SOLU-MEDROL) injection  20 mg  Intravenous Q12H   [START ON 01/08/2021] pantoprazole  40 mg Intravenous Q12H    Objective: Vital signs in last 24 hours: Temp:  [97.5 F (36.4 C)-98.6 F (37 C)] 97.5 F (36.4 C) (10/13 0751) Pulse Rate:  [83-108] 83 (10/13 0751) Resp:  [16-24] 20 (10/13 0751) BP: (130-165)/(89-112) 165/107 (10/13 0751) SpO2:  [92 %-97 %] 97 % (10/13 0751) Weight:  [50.9 kg] 50.9 kg (10/13 0536) Constitutional:  oriented to person, place, and time. 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  Mouth/Throat: edentulous Oropharynx is clear and dry . No oropharyngeal exudate.  Shunt under  scalp with no pain, tenderness Cardiovascular: Normal rate, regular rhythm and normal heart sounds.  No murmur heard.  Pulmonary/Chest: Effort normal and breath sounds normal. No respiratory distress.  has no wheezes.  Neck = supple, no nuchal rigidity Abdominal: Soft. Bowel sounds are normal.  exhibits no distension. There is no tenderness.  Lymphadenopathy: no cervical adenopathy. 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 Some contractures on hands Skin: Skin is warm and dry. No rash noted. No erythema.  Psychiatric: a normal mood and affect.  behavior is normal.   Lab Results Recent  Labs    01/06/21 0415 01/07/21 0455  WBC 18.7* 11.3*  HGB 11.9* 9.4*  HCT 36.3 29.4*  NA 141 136  K 4.6 4.2  CL 109 104  CO2 23 24  BUN 13 15  CREATININE 0.71 0.62    Microbiology: Results for orders placed or performed during the hospital encounter of 01/04/21  Resp Panel by RT-PCR (Flu A&B, Covid) Nasopharyngeal Swab     Status: None   Collection Time: 01/04/21 12:05 PM   Specimen: Nasopharyngeal Swab; Nasopharyngeal(NP) swabs in vial transport medium  Result Value Ref Range Status   SARS Coronavirus 2 by RT PCR NEGATIVE NEGATIVE Final    Comment: (NOTE) SARS-CoV-2 target nucleic acids are NOT  DETECTED.  The SARS-CoV-2 RNA is generally detectable in upper respiratory specimens during the acute phase of infection. The lowest concentration of SARS-CoV-2 viral copies this assay can detect is 138 copies/mL. A negative result does not preclude SARS-Cov-2 infection and should not be used as the sole basis for treatment or other patient management decisions. A negative result may occur with  improper specimen collection/handling, submission of specimen other than nasopharyngeal swab, presence of viral mutation(s) within the areas targeted by this assay, and inadequate number of viral copies(<138 copies/mL). A negative result must be combined with clinical observations, patient history, and epidemiological information. The expected result is Negative.  Fact Sheet for Patients:  BloggerCourse.com  Fact Sheet for Healthcare Providers:  SeriousBroker.it  This test is no t yet approved or cleared by the [place] FDA and  has been authorized for detection and/or diagnosis of SARS-CoV-2 by FDA under an Emergency Use Authorization (EUA). This EUA will remain  in effect (meaning this test can be used) for the duration of the COVID-19 declaration under Section 564(b)(1) of the Act, 21 U.S.C.section 360bbb-3(b)(1), unless the authorization is terminated  or revoked sooner.       Influenza A by PCR NEGATIVE NEGATIVE Final   Influenza B by PCR NEGATIVE NEGATIVE Final    Comment: (NOTE) The Xpert Xpress SARS-CoV-2/FLU/RSV plus assay is intended as an aid in the diagnosis of influenza from Nasopharyngeal swab specimens and should not be used as a sole basis for treatment. Nasal washings and aspirates are unacceptable for Xpert Xpress SARS-CoV-2/FLU/RSV testing.  Fact Sheet for Patients: BloggerCourse.com  Fact Sheet for Healthcare Providers: SeriousBroker.it  This test is not yet  approved or cleared by the [place] FDA and has been authorized for detection and/or diagnosis of SARS-CoV-2 by FDA under an Emergency Use Authorization (EUA). This EUA will remain in effect (meaning this test can be used) for the duration of the COVID-19 declaration under Section 564(b)(1) of the Act, 21 U.S.C. section 360bbb-3(b)(1), unless the authorization is terminated or revoked.  Performed at [facility], [address] [place], [place] [address]   Blood culture (routine single)     Status: None (Preliminary result)   Collection Time: 01/04/21  2:58 PM   Specimen: BLOOD  Result Value Ref Range Status   Specimen Description   Final    BLOOD LEFT ANTECUBITAL Performed at [facility], [address], [place], [place] [address]    Special Requests   Final    BOTTLES DRAWN AEROBIC AND ANAEROBIC Blood Culture adequate volume Performed at [facility], [address] [place], [place] [address]    Culture  Setup Time   Final    YEAST ANAEROBIC BOTTLE ONLY Organism ID to follow CRITICAL RESULT CALLED TO, READ BACK  BY AND VERIFIED WITH: SAMANTHA RAUER 01/05/21 1507 SLM IN BOTH AEROBIC AND ANAEROBIC BOTTLES Performed at [facility], [address] [place], [place] [address]    Culture YEAST  Final   Report Status PENDING  Incomplete  Blood Culture ID Panel (Reflexed)     Status: Abnormal   Collection Time: 01/04/21  2:58 PM  Result Value Ref Range Status   Enterococcus faecalis NOT DETECTED NOT DETECTED Final   Enterococcus Faecium NOT DETECTED NOT DETECTED Final   Listeria monocytogenes NOT DETECTED NOT DETECTED Final   Staphylococcus species NOT DETECTED NOT DETECTED Final   Staphylococcus aureus (BCID) NOT DETECTED NOT DETECTED Final   Staphylococcus epidermidis NOT DETECTED NOT DETECTED Final   Staphylococcus lugdunensis NOT DETECTED NOT DETECTED Final   Streptococcus species NOT DETECTED NOT DETECTED Final   Streptococcus  agalactiae NOT DETECTED NOT DETECTED Final   Streptococcus pneumoniae NOT DETECTED NOT DETECTED Final   Streptococcus pyogenes NOT DETECTED NOT DETECTED Final   A.calcoaceticus-baumannii NOT DETECTED NOT DETECTED Final   Bacteroides fragilis NOT DETECTED NOT DETECTED Final   Enterobacterales NOT DETECTED NOT DETECTED Final   Enterobacter cloacae complex NOT DETECTED NOT DETECTED Final   Escherichia coli NOT DETECTED NOT DETECTED Final   Klebsiella aerogenes NOT DETECTED NOT DETECTED Final   Klebsiella oxytoca NOT DETECTED NOT DETECTED Final   Klebsiella pneumoniae NOT DETECTED NOT DETECTED Final   Proteus species NOT DETECTED NOT DETECTED Final   Salmonella species NOT DETECTED NOT DETECTED Final   Serratia marcescens NOT DETECTED NOT DETECTED Final   Haemophilus influenzae NOT DETECTED NOT DETECTED Final   Neisseria meningitidis NOT DETECTED NOT DETECTED Final   Pseudomonas aeruginosa NOT DETECTED NOT DETECTED Final   Stenotrophomonas maltophilia NOT DETECTED NOT DETECTED Final   Candida albicans NOT DETECTED NOT DETECTED Final   Candida auris NOT DETECTED NOT DETECTED Final   Candida glabrata NOT DETECTED NOT DETECTED Final   Candida krusei DETECTED (A) NOT DETECTED Final    Comment: CRITICAL RESULT CALLED TO, READ BACK BY AND VERIFIED WITH: SAMANTHA RAUER 01/05/21 1507 SLM    Candida parapsilosis NOT DETECTED NOT DETECTED Final   Candida tropicalis NOT DETECTED NOT DETECTED Final   Cryptococcus neoformans/gattii NOT DETECTED NOT DETECTED Final    Comment: Performed at [facility], [address] [place], [place] [address]  MRSA Next Gen by PCR, Nasal     Status: None   Collection Time: 01/04/21  4:58 PM   Specimen: Nasal Mucosa; Nasal Swab  Result Value Ref Range Status   MRSA by PCR Next Gen NOT DETECTED NOT DETECTED Final    Comment: (NOTE) The GeneXpert MRSA Assay (FDA approved for NASAL specimens only), is one component of a comprehensive MRSA colonization  surveillance program. It is not intended to diagnose MRSA infection nor to guide or monitor treatment for MRSA infections. Test performance is not FDA approved in patients less than 41 years old. Performed at [facility], [address] [place], [place] [address]   Urine Culture     Status: Abnormal   Collection Time: 01/04/21  8:50 PM   Specimen: Urine, Clean Catch  Result Value Ref Range Status   Specimen Description   Final    URINE, CLEAN CATCH Performed at [facility], [address] [place], [place] [address]    Special Requests   Final    NONE Performed at [facility], [address], [place], [place] [address]    Culture (A)  Final    <10,000 COLONIES/mL INSIGNIFICANT GROWTH Performed at [facility], [address], [place], [place] [address]    Report Status 01/07/2021 FINAL  Final  Gastrointestinal Panel by PCR , Stool     Status: None   Collection Time: 01/05/21  3:01 PM   Specimen: Stool  Result Value Ref Range Status   Campylobacter species NOT DETECTED NOT DETECTED Final   Plesimonas shigelloides NOT DETECTED NOT DETECTED Final   Salmonella species NOT DETECTED NOT DETECTED Final   Yersinia enterocolitica NOT DETECTED NOT DETECTED Final   Vibrio species NOT DETECTED NOT DETECTED Final   Vibrio cholerae NOT DETECTED NOT DETECTED Final   Enteroaggregative E coli (EAEC) NOT DETECTED NOT DETECTED Final   Enteropathogenic E coli (EPEC) NOT DETECTED NOT DETECTED Final   Enterotoxigenic E coli (ETEC) NOT DETECTED NOT DETECTED Final   Shiga like toxin producing E coli (STEC) NOT DETECTED NOT DETECTED Final   Shigella/Enteroinvasive E coli (EIEC) NOT DETECTED NOT DETECTED Final   Cryptosporidium NOT DETECTED NOT DETECTED Final   Cyclospora cayetanensis NOT DETECTED NOT DETECTED Final   Entamoeba histolytica NOT DETECTED NOT DETECTED Final   Giardia lamblia NOT DETECTED NOT DETECTED Final   Adenovirus F40/41 NOT DETECTED NOT  DETECTED Final   Astrovirus NOT DETECTED NOT DETECTED Final   Norovirus GI/GII NOT DETECTED NOT DETECTED Final   Rotavirus A NOT DETECTED NOT DETECTED Final   Sapovirus (I, II, IV, and V) NOT DETECTED NOT DETECTED Final    Comment: Performed at [facility], [address] [place], [place] [address]  C Difficile Quick Screen w PCR reflex     Status: Abnormal   Collection Time: 01/05/21  3:01 PM   Specimen: STOOL  Result Value Ref Range Status   C Diff antigen POSITIVE (A) NEGATIVE Final   C Diff toxin NEGATIVE NEGATIVE Final   C Diff interpretation Results are indeterminate. See PCR results.  Final    Comment: Performed at [facility], [address] [place], [place] [address]  C. Diff by PCR, Reflexed     Status: None   Collection Time: 01/05/21  3:01 PM  Result Value Ref Range Status   Toxigenic C. Difficile by PCR NEGATIVE NEGATIVE Final    Comment: Patient is colonized with non toxigenic C. difficile. May not need treatment unless significant symptoms are present. Performed at [facility], [address] [place], [place] [address]   Culture, blood (single)     Status: None (Preliminary result)   Collection Time: 01/05/21  4:42 PM   Specimen: BLOOD  Result Value Ref Range Status   Specimen Description BLOOD BLOOD LEFT HAND  Final   Special Requests   Final    BOTTLES DRAWN AEROBIC AND ANAEROBIC Blood Culture adequate volume   Culture   Final    NO GROWTH 2 DAYS Performed at [facility], [address] [place], [place] [address]    Report Status PENDING  Incomplete    Studies/Results: CT ANGIO HEAD NECK W WO CM  Result Date: 01/06/2021 CLINICAL DATA:  Stroke, follow-up EXAM: CT ANGIOGRAPHY HEAD AND NECK TECHNIQUE: Multidetector CT imaging of the head and neck was performed using the standard protocol during bolus administration of intravenous contrast. Multiplanar CT image reconstructions and MIPs were obtained to evaluate the  vascular anatomy. Carotid stenosis measurements (when applicable) are obtained utilizing NASCET criteria, using the distal internal carotid diameter as the denominator. CONTRAST:  70mL OMNIPAQUE IOHEXOL 350 MG/ML  SOLN COMPARISON:  CT brain 01/04/2021 MRI brain and MRA head and neck 01/06/2021 FINDINGS: CT HEAD FINDINGS Brain: Redemonstrated tethering of the right parietal cortex to a right parietal burr hole. 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 No new evidence of infarction. Redemonstrated right cerebral convexity fluid collection, measuring to 7 mm. Right frontal approach ventriculostomy catheter, with tip approximating the interventricular septum. Unchanged size and configuration of the ventricular system. No acute hemorrhage, mass, mass effect, or midline shift. Vascular: See CTA findings. Skull: Right frontal and left parietal burr holes. No acute osseous abnormality.Prior suboccipital craniectomy. Sinuses: Imaged portions are clear. Orbits: No acute finding. Review of the MIP images confirms the above findings CTA NECK FINDINGS Aortic arch: 2 vessel arch, with common origin of the brachiocephalic and right common carotid arteries. Imaged portion shows no evidence of aneurysm or dissection. No significant stenosis of the major arch vessel origins. Right carotid system: Complete occlusion of the right internal carotid artery, from the level of the bifurcation to the mid petrous portion (series 14, image 177 and 125). The right common and external carotid arteries appear patent and without significant stenosis or occlusion. Left carotid system: No evidence of dissection, stenosis (50% or greater) or occlusion. Retropharyngeal left internal carotid artery. Vertebral arteries: Non opacification of the majority of the left vertebral artery, with minimal thread-like reconstitution in the mid cervical spine (series 14, image 126), with diminutive left V3  and V4 segments. The right vertebral artery is patent from its origin to the vertebrobasilar junction. Skeleton: No acute osseous abnormality. Other neck: Negative Upper chest: Diffuse ground-glass and nodular opacities in the right greater than left imaged lungs. Review of the MIP images confirms the above findings CTA HEAD FINDINGS Anterior circulation: The right intracranial internal carotid artery reconstitutes in the petrous portion (series 14, image 177) but remains narrow for the remainder of its course to the terminus. Diminutive right MCA, which appears patent. The right A1 is diminutive but patent. Distal right MCA branches appear perfused. The left intracranial internal carotid artery is patent to the terminus, without stenosis or other abnormality. The left A1 and more distal ACA segments are patent. Normal anterior communicating artery. Normal left M1, bifurcation, and distal segments patent. Posterior circulation: The right vertebral artery is patent to the vertebrobasilar junction. Thread-like left V4 segment, which may terminate in PICA. The basilar artery is patent. Superior cerebellar arteries and posterior cerebral arteries are patent. Venous sinuses: As permitted by contrast timing, patent. Anatomic variants: None significant Review of the MIP images confirms the above findings IMPRESSION: 1. 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 2. Non opacification of the proximal left vertebral artery from its origin, with thread-like enhancement in the mid V2 segment, extending to the V3 and V4 segments. 3. Diminutive right MCA and ACA, which appear patent. No additional intracranial large vessel occlusion. 4. Extensive ground-glass opacities in the imaged lungs, which are new compared to 01/04/2021, concerning for infection. Consider repeat chest CT. 5. Redemonstrated tethering of the right parietal cortex to a right  parietal burr hole, with cortical hypodensity likely representing infarcts of varying ages, better evaluated on same-day MRI. These results were called by telephone at the time of interpretation on 01/06/2021 at 6:58 pm to provider Dr. Fran Lowes, who verbally acknowledged these results. Electronically Signed   By: Wiliam Ke M.D.   On: 01/06/2021 18:59   DG  Skull 1-3 Views  Result Date: 01/06/2021 CLINICAL DATA:  Complication of ventricular shunt, lateral view only EXAM: SKULL - 1-3 VIEW COMPARISON:  01/04/2021 FINDINGS: Single lateral view of the skull. Shunt set at 100 mm of H2O, with shunt dial at a slightly different angle than on 01/04/2021. No evidence of skull fracture or other focal bone lesion. Otherwise unchanged appearance of right frontal approach ventriculostomy catheter. IMPRESSION: Shunt set at 100 mm H2O. Electronically Signed   By: Wiliam Ke M.D.   On: 01/06/2021 14:27   MR ANGIO HEAD WO CONTRAST  Result Date: 01/06/2021 CLINICAL DATA:  Stroke follow-up EXAM: MRI HEAD WITHOUT AND WITH CONTRAST MRA HEAD WITHOUT CONTRAST MRA NECK WITHOUT AND WITH CONTRAST TECHNIQUE: Multiplanar, multiecho pulse sequences of the brain and surrounding structures were obtained without and with intravenous contrast. Angiographic images of the head were obtained using MRA technique without contrast. Angiographic images of the neck were acquired using MRA technique without and with intravenous contrast. Carotid stenosis measurements (when applicable) are obtained utilizing NASCET criteria, using the distal internal carotid diameter as the denominator. CONTRAST:  35mL GADAVIST GADOBUTROL 1 MMOL/ML IV SOLN COMPARISON:  01/04/2021 and 10/12/2019 CT head, no prior MRI FINDINGS: MRI HEAD FINDINGS Brain: Evaluation is somewhat limited by susceptibility artifact from overlying shunt hardware. Increased curvilinear cortical T1 signal on precontrast imaging in the right parietal, posterior temporal, and lateral occipital  lobes, likely laminar necrosis. Tethering of a portion of the right lateral parietal lobe, extending to the calvarium and the location of a prior right parietal burr hole (series 16, image 96). Possible area of restricted diffusion in the posterior right temporal lobe (series 5, image 25). Additional areas of increased signal on diffusion-weighted imaging are without definite ADC correlate and may represent remote infarcts. Fluid collection measuring up to 6 mm along the right parietal convexity in this area, without evidence of hemorrhage or proteinaceous fluid; the fluid collection follows CSF signal on all sequences. Mild diffuse dural thickening. No definite abnormal enhancement. Right frontal approach ventriculostomy catheter with tip approximating the body of the left lateral ventricle. No hydrocephalus. Status post prior suboccipital craniectomy with unchanged appearance of the cerebellum and tonsils compared to Vascular: See MRA findings, below. Skull and upper cervical spine: Status post suboccipital craniectomy. Right frontal and parietal burr holes. Otherwise normal marrow signal. Sinuses/Orbits: Negative. Other: The mastoids are well aerated. MRA HEAD FINDINGS No definite flow seen in the right intracranial internal carotid artery, with flow reconstituting in the supraclinoid segment. The right intracranial internal carotid artery is patent to the terminus, without stenosis or other abnormality. A1 segments patent, although there is somewhat poor flow in the right A1. Anterior communicating artery not well visualized. Possible fusiform aneurysmal dilatation of the proximal right A2 (series 1050, image 108), although this may be artifactual. Anterior cerebral arteries are patent to their distal aspects. Normal left MCA, with distal branches well perfused. The right M1 is somewhat irregular; although the distal branches appear perfused, they are less perfused than the left MCA branches. Right vertebral  artery is patent to the vertebrobasilar junction without stenosis. Variant branching at the distal basilar artery, which bifurcates before giving off the PCA and SCA bilaterally. Distal PCA branches appear well perfused. MRA NECK FINDINGS Aortic arch: Standard branching. No evidence of dissection or stenosis. Right carotid system: Severe focal stenosis at the origin of the ICA (series 1107, image 38), with occlusion slightly more distally. The right ICA is not definitively seen for the remainder of  extracranial course. Left carotid system: Patent without significant stenosis peer Vertebral arteries: The right vertebral artery is well visualized and appears patent, without hemodynamically significant stenosis. The left vertebral artery is not visualized from just distal to its origin lesion. Other: None IMPRESSION: 1. Tethering of a segment of the right parietal cortex to a right parietal burr hole. 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 Small focus of restricted diffusion within the posterior right temporal lobe may reflect a small more acute infarct. 2. Dural thickening, suggestive of intracranial hypotension, possibly due to over shunting. This could also explain the apparent right parietal convexity extra-axial collection, which would accentuate and exacerbate the tethering. 3. Non opacification of the majority of the extra and intracranial right ICA, with reconstitution at the supraclinoid segment, likely via collaterals. Poor flow in the right ACA and MCA, with possible fusiform dilatation of the right A2 segment. Consider CTA for better evaluation. 4. Non opacification of the left vertebral artery. These results were called by telephone at the time of interpretation on 01/06/2021 at 1:40 pm to provider [facility] , who verbally acknowledged these results.  Electronically Signed   By: Wiliam Ke M.D.   On: 01/06/2021 13:42   MR ANGIO NECK W WO CONTRAST  Result Date: 01/06/2021 CLINICAL DATA:  Stroke follow-up EXAM: MRI HEAD WITHOUT AND WITH CONTRAST MRA HEAD WITHOUT CONTRAST MRA NECK WITHOUT AND WITH CONTRAST TECHNIQUE: Multiplanar, multiecho pulse sequences of the brain and surrounding structures were obtained without and with intravenous contrast. Angiographic images of the head were obtained using MRA technique without contrast. Angiographic images of the neck were acquired using MRA technique without and with intravenous contrast. Carotid stenosis measurements (when applicable) are obtained utilizing NASCET criteria, using the distal internal carotid diameter as the denominator. CONTRAST:  13mL GADAVIST GADOBUTROL 1 MMOL/ML IV SOLN COMPARISON:  01/04/2021 and 10/12/2019 CT head, no prior MRI FINDINGS: MRI HEAD FINDINGS Brain: Evaluation is somewhat limited by susceptibility artifact from overlying shunt hardware. 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 Tethering of a portion of the right lateral parietal lobe, extending to the calvarium and the location of a prior right parietal burr hole (series 16, image 96). Possible area of restricted diffusion in the posterior right temporal lobe (series 5, image 25). Additional areas of increased signal on diffusion-weighted imaging are without definite ADC correlate and may represent remote infarcts. Fluid collection measuring up to 6 mm along the right parietal convexity in this area, without evidence of hemorrhage or proteinaceous fluid; the fluid collection follows CSF signal on all sequences. Mild diffuse dural thickening. No definite abnormal enhancement. Right frontal approach ventriculostomy catheter with tip approximating the body of the left lateral ventricle. No hydrocephalus. Status post  prior suboccipital craniectomy with unchanged appearance of the cerebellum and tonsils compared to Vascular: See MRA findings, below. Skull and upper cervical spine: Status post suboccipital craniectomy. Right frontal and parietal burr holes. Otherwise normal marrow signal. Sinuses/Orbits: Negative. Other: The mastoids are well aerated. MRA HEAD FINDINGS No definite flow seen in the right intracranial internal carotid artery, with flow reconstituting in the supraclinoid segment. The right intracranial internal carotid artery is patent to the terminus, without stenosis or other abnormality. A1 segments patent, although there is somewhat poor flow in the right A1. Anterior communicating artery  not well visualized. Possible fusiform aneurysmal dilatation of the proximal right A2 (series 1050, image 108), although this may be artifactual. Anterior cerebral arteries are patent to their distal aspects. Normal left MCA, with distal branches well perfused. The right M1 is somewhat irregular; although the distal branches appear perfused, they are less perfused than the left MCA branches. Right vertebral artery is patent to the vertebrobasilar junction without stenosis. Variant branching at the distal basilar artery, which bifurcates before giving off the PCA and SCA bilaterally. Distal PCA branches appear well perfused. MRA NECK FINDINGS Aortic arch: Standard branching. No evidence of dissection or stenosis. Right carotid system: Severe focal stenosis at the origin of the ICA (series 1107, image 38), with occlusion slightly more distally. The right ICA is not definitively seen for the remainder of extracranial course. Left carotid system: Patent without significant stenosis peer Vertebral arteries: The right vertebral artery is well visualized and appears patent, without hemodynamically significant stenosis. The left vertebral artery is not visualized from just distal to its origin lesion. Other: None IMPRESSION: 1.  Tethering of a segment of the right parietal cortex to a right parietal burr hole. 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 Small focus of restricted diffusion within the posterior right temporal lobe may reflect a small more acute infarct. 2. Dural thickening, suggestive of intracranial hypotension, possibly due to over shunting. This could also explain the apparent right parietal convexity extra-axial collection, which would accentuate and exacerbate the tethering. 3. Non opacification of the majority of the extra and intracranial right ICA, with reconstitution at the supraclinoid segment, likely via collaterals. Poor flow in the right ACA and MCA, with possible fusiform dilatation of the right A2 segment. Consider CTA for better evaluation. 4. Non opacification of the left vertebral artery. These results were called by telephone at the time of interpretation on 01/06/2021 at 1:40 pm to provider MCNEILL [facility] , who verbally acknowledged these results. Electronically Signed   By: Wiliam Ke M.D.   On: 01/06/2021 13:42   MR BRAIN W WO CONTRAST  Result Date: 01/06/2021 CLINICAL DATA:  Stroke follow-up EXAM: MRI HEAD WITHOUT AND WITH CONTRAST MRA HEAD WITHOUT CONTRAST MRA NECK WITHOUT AND WITH CONTRAST TECHNIQUE: Multiplanar, multiecho pulse sequences of the brain and surrounding structures were obtained without and with intravenous contrast. Angiographic images of the head were obtained using MRA technique without contrast. Angiographic images of the neck were acquired using MRA technique without and with intravenous contrast. Carotid stenosis measurements (when applicable) are obtained utilizing NASCET criteria, using the distal internal carotid diameter as the denominator. CONTRAST:  5mL GADAVIST GADOBUTROL 1 MMOL/ML IV SOLN COMPARISON:  01/04/2021 and  10/12/2019 CT head, no prior MRI FINDINGS: MRI HEAD FINDINGS Brain: Evaluation is somewhat limited by susceptibility artifact from overlying shunt hardware. 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 Tethering of a portion of the right lateral parietal lobe, extending to the calvarium and the location of a prior right parietal burr hole (series 16, image 96). Possible area of restricted diffusion in the posterior right temporal lobe (series 5, image 25). Additional areas of increased signal on diffusion-weighted imaging are without definite ADC correlate and may represent remote infarcts. Fluid collection measuring up to 6 mm along the right parietal convexity in this area, without evidence of hemorrhage  or proteinaceous fluid; the fluid collection follows CSF signal on all sequences. Mild diffuse dural thickening. No definite abnormal enhancement. Right frontal approach ventriculostomy catheter with tip approximating the body of the left lateral ventricle. No hydrocephalus. Status post prior suboccipital craniectomy with unchanged appearance of the cerebellum and tonsils compared to Vascular: See MRA findings, below. Skull and upper cervical spine: Status post suboccipital craniectomy. Right frontal and parietal burr holes. Otherwise normal marrow signal. Sinuses/Orbits: Negative. Other: The mastoids are well aerated. MRA HEAD FINDINGS No definite flow seen in the right intracranial internal carotid artery, with flow reconstituting in the supraclinoid segment. The right intracranial internal carotid artery is patent to the terminus, without stenosis or other abnormality. A1 segments patent, although there is somewhat poor flow in the right A1. Anterior communicating artery not well visualized. Possible fusiform aneurysmal dilatation of the proximal right A2 (series 1050, image 108), although this may  be artifactual. Anterior cerebral arteries are patent to their distal aspects. Normal left MCA, with distal branches well perfused. The right M1 is somewhat irregular; although the distal branches appear perfused, they are less perfused than the left MCA branches. Right vertebral artery is patent to the vertebrobasilar junction without stenosis. Variant branching at the distal basilar artery, which bifurcates before giving off the PCA and SCA bilaterally. Distal PCA branches appear well perfused. MRA NECK FINDINGS Aortic arch: Standard branching. No evidence of dissection or stenosis. Right carotid system: Severe focal stenosis at the origin of the ICA (series 1107, image 38), with occlusion slightly more distally. The right ICA is not definitively seen for the remainder of extracranial course. Left carotid system: Patent without significant stenosis peer Vertebral arteries: The right vertebral artery is well visualized and appears patent, without hemodynamically significant stenosis. The left vertebral artery is not visualized from just distal to its origin lesion. Other: None IMPRESSION: 1. Tethering of a segment of the right parietal cortex to a right parietal burr hole. 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 Small focus of restricted diffusion within the posterior right temporal lobe may reflect a small more acute infarct. 2. Dural thickening, suggestive of intracranial hypotension, possibly due to over shunting. This could also explain the apparent right parietal convexity extra-axial collection, which would accentuate and exacerbate the tethering. 3. Non opacification of the majority of the extra and intracranial right ICA, with reconstitution at the supraclinoid segment, likely via collaterals. Poor flow in the right ACA and MCA, with possible fusiform dilatation  of the right A2 segment. Consider CTA for better evaluation. 4. Non opacification of the left vertebral artery. These results were called by telephone at the time of interpretation on 01/06/2021 at 1:40 pm to provider MCNEILL [facility] , who verbally acknowledged these results. Electronically Signed   By: Wiliam Ke M.D.   On: 01/06/2021 13:42   ECHOCARDIOGRAM COMPLETE  Result Date: 01/05/2021    ECHOCARDIOGRAM REPORT   Patient Name:   SANIKA BROSIOUS Date of Exam: 01/05/2021 Medical Rec #:  [id number]        Height:       60.0 in Accession #:    [id number]       Weight:       106.9 lb Date of Birth:  1976/03/26       BSA:          1.430 m Patient Age:  44 years         BP:           157/111 mmHg Patient Gender: F                HR:           97 bpm. Exam Location:  [facility] Procedure: 2D Echo, Color Doppler and Cardiac Doppler Indications:     I63.9 Stroke  History:         Patient has no prior history of Echocardiogram examinations.                  Risk Factors:Hypertension.  Sonographer:     Humphrey Rolls Referring Phys:  [phone] MCNEILL P KIRKPATRICK Diagnosing Phys: Yvonne Kendall MD  Sonographer Comments: Suboptimal subcostal window. IMPRESSIONS  1. Left ventricular ejection fraction, by estimation, is 55 to 60%. The left ventricle has normal function. The left ventricle has no regional wall motion abnormalities. Left ventricular diastolic parameters are consistent with Grade I diastolic dysfunction (impaired relaxation). Elevated left atrial pressure.  2. Right ventricular systolic function is normal. The right ventricular size is normal.  3. The mitral valve is abnormal. Cannot exclude prolapse of posterior leaflet or cleft. There is at least mild to moderate mitral valve regurgitation. No evidence of mitral stenosis.  4. The aortic valve is tricuspid. Aortic valve regurgitation is not visualized. No aortic stenosis is present.  5. The inferior vena cava is normal in size with <50% respiratory  variability, suggesting right atrial pressure of 8 mmHg. FINDINGS  Left Ventricle: Left ventricular ejection fraction, by estimation, is 55 to 60%. The left ventricle has normal function. The left ventricle has no regional wall motion abnormalities. The left ventricular internal cavity size was normal in size. There is  no left ventricular hypertrophy. Left ventricular diastolic parameters are consistent with Grade I diastolic dysfunction (impaired relaxation). Elevated left atrial pressure. Right Ventricle: The right ventricular size is normal. No increase in right ventricular wall thickness. Right ventricular systolic function is normal. Left Atrium: Left atrial size was normal in size. Right Atrium: Right atrial size was normal in size. Pericardium: There is no evidence of pericardial effusion. Presence of pericardial fat pad. Mitral Valve: The mitral valve is abnormal. Mild to moderate mitral valve regurgitation. No evidence of mitral valve stenosis. MV peak gradient, 6.0 mmHg. The mean mitral valve gradient is 3.0 mmHg. Tricuspid Valve: The tricuspid valve is not well visualized. Tricuspid valve regurgitation is trivial. Aortic Valve: The aortic valve is tricuspid. Aortic valve regurgitation is not visualized. No aortic stenosis is present. Aortic valve mean gradient measures 6.0 mmHg. Aortic valve peak gradient measures 11.3 mmHg. Aortic valve area, by VTI measures 2.04  cm. Pulmonic Valve: The pulmonic valve was not well visualized. Pulmonic valve regurgitation is trivial. No evidence of pulmonic stenosis. Aorta: The aortic root and ascending aorta are structurally normal, with no evidence of dilitation. Pulmonary Artery: The pulmonary artery is not well seen. Venous: The inferior vena cava is normal in size with less than 50% respiratory variability, suggesting right atrial pressure of 8 mmHg. IAS/Shunts: The interatrial septum was not well visualized.  LEFT VENTRICLE PLAX 2D LVIDd:         4.40 cm    Diastology LVIDs:         3.16 cm   LV e' medial:    6.31 cm/s LV PW:         0.85 cm   LV E/e' medial:  16.5  LV IVS:        0.66 cm   LV e' lateral:   7.83 cm/s LVOT diam:     1.80 cm   LV E/e' lateral: 13.3 LV SV:         63 LV SV Index:   44 LVOT Area:     2.54 cm  RIGHT VENTRICLE RV Basal diam:  2.72 cm RV S prime:     15.60 cm/s LEFT ATRIUM             Index        RIGHT ATRIUM          Index LA diam:        2.80 cm 1.96 cm/m   RA Area:     8.05 cm LA Vol (A2C):   26.3 ml 18.39 ml/m  RA Volume:   16.00 ml 11.19 ml/m LA Vol (A4C):   32.9 ml 23.00 ml/m LA Biplane Vol: 29.9 ml 20.90 ml/m  AORTIC VALVE                     PULMONIC VALVE AV Area (Vmax):    1.95 cm      PV Vmax:       1.16 m/s AV Area (Vmean):   1.94 cm      PV Vmean:      74.500 cm/s AV Area (VTI):     2.04 cm      PV VTI:        0.167 m AV Vmax:           168.00 cm/s   PV Peak grad:  5.4 mmHg AV Vmean:          119.000 cm/s  PV Mean grad:  2.0 mmHg AV VTI:            0.309 m AV Peak Grad:      11.3 mmHg AV Mean Grad:      6.0 mmHg LVOT Vmax:         129.00 cm/s LVOT Vmean:        90.500 cm/s LVOT VTI:          0.248 m LVOT/AV VTI ratio: 0.80  AORTA Ao Root diam: 2.60 cm MITRAL VALVE MV Area (PHT): 6.96 cm     SHUNTS MV Area VTI:   2.99 cm     Systemic VTI:  0.25 m MV Peak grad:  6.0 mmHg     Systemic Diam: 1.80 cm MV Mean grad:  3.0 mmHg MV Vmax:       1.22 m/s MV Vmean:      79.0 cm/s MV Decel Time: 109 msec MV E velocity: 104.00 cm/s MV A velocity: 124.00 cm/s MV E/A ratio:  0.84 Christopher End MD Electronically signed by Yvonne Kendall MD Signature Date/Time: 01/05/2021/10:54:03 AM    Final     Assessment/Plan: Carolyn Herring is a 45 y.o. 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 Admitted with falls, weakness and found to have profound anemia. Had large ulcer from NSAIDS noted on EGD. Initially WBC 30 but no fevers. BCX neg initially  but now growing Candida krusei. She seems to be back to her baseline mentally. She has CT with possible subacute infarct and possible small SDH.  She denies any recent shunt issues. Unclear if she is neurologically at her baseline.  MRI is pending.  The candidemia source is most likely the gastric ulcer and translocation from the gut.  Need to eval for endocarditis as not clear how long could have been candidemic.  TTE shows abnormality on MV.  I am not convinced the shunt is infected as no peritonitis, no obvious change in her mental status however the only way to rule it out would be to tap shunt.   Recommendations Can dc unasyn Cont eraxis. Repeat bcx NGTD.   Do LP and send for cell count, diff, protein, glucose, routine and fungal cultue. Will need TEE given abnormality on TTE.  Would consult ophtho ifor detailed retinal exam given candidemia and her recent vision complaints.   Thank you very much for the consult. Will follow with you.  Mick Sell   01/07/2021, 9:43 AM

## 2021-01-07 NOTE — Progress Notes (Signed)
PT Cancellation Note  Patient Details Name: Carolyn Herring MRN: [id number] DOB: [birth year]   Cancelled Treatment:    Reason Eval/Treat Not Completed: Other (comment) Pt eating breakfast. PT to reassess as able.  [employer], SPT

## 2021-01-07 NOTE — Progress Notes (Signed)
PROGRESS NOTE    Carolyn Herring  ZOX:[id number] DOB: 1975-06-27 DOA: 01/04/2021 PCP: Patient, No Pcp Per (Inactive)   Brief Narrative: Taken from prior notes. 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  She has not seen a neurosurgeon in 3 yrs.  She presented to [facility] ER on 10/10 via EMS from home with recurrent falls, generalized weakness, and back pain.  She is unsure if she has been hitting her head during the falls.  She also endorsed several week hx of abdominal pain and diarrhea.  She was diagnosed with COVID-19 in 08/2020, and has had worsening generalized weakness since the dx.  She has not seen a PCP for evaluation of symptoms.  She does live alone, but her aunt lives next door.  Of note pt able to notify EMS for assistance, however when they arrived at her home she was unable to answer the door.  Therefore,  EMS had to break her door down to gain entry.    On EMS arrival patient was hypotensive with systolic in 60s, heart rate 57.  Blood pressure improved with 2 L of LR bolus.  Lactic acidosis at 3.6, procalcitonin at 24.93, WBCs of 30.1, hemoglobin of 2.7, hematocrit of 10.4 and stool occult positive.  Patient was using Goody powder and ibuprofen frequently for abdominal pain.  Massive blood transfusion protocol activated on arrival to ED but patient only received 2 units of PRBC and 1 unit of FFP.  Hemoglobin improved to 11.8, doubtful that original reading was Real, might be some lab error.  Last hemoglobin in the chart was 14.4 in July 2021. She was started on Zosyn empirically for a possible intra-abdominal infection.  CT abdomen and pelvis was without any acute significant abnormality.  There was a focal peripheral hypodensity in the upper posterior aspect of the spleen, possibly a small cyst, hemangioma or a possible small splenic infarct.  No other abnormality found. Blood cultures  negative in 24 hours, COVID-19 and influenza PCR negative, MRSA swab negative. Chest x-ray without any acute abnormality.  CT head and cervical spine with new hyperdense right subdural collection, without evidence of acute blood products.  There was an underlying loss of gray-white matter differentiation in the right parietal lobe concerning for acute-subacute infarct.  No new focal deficit.  Neurology was consulted and they are recommending MRI and MRA if her shunt device is compatible. GI was also consulted and she is going for EGD today. Patient initially admitted to ICU, TRH to resume care on 01/05/2021  10/11 : patient appears to be at baseline.  Patient denies any obvious bleeding, hematemesis, melena or hematochezia.  She was having some diarrhea for some time.  She is pretty much independent for ADLs and walk with the help of walker.  Lives alone with family keep checking on her.  They are not lives next door.  She denies any fever or chills.  She denies any menorrhagia, stating that her periods are becoming more scant, she thinks she is approaching menopause.   Subjective: Continued to have black diarrhea, but reduced in episodes.  Abdominal pain associated with BM.  Complained of her backside feeling raw.  Had LP today.      Assessment & Plan:   Active Problems:   Hemorrhagic shock (HCC)   Fall   Altered mental status   History of brain shunt   Pressure injury of  skin  Severe sepsis 2/2 Candidemia --leukocytosis, tachycardia on presenting to ED, and lactic acidosis.  Procalcitonin markedly elevated.   --She initially received Zosyn which was transitioned to Unasyn.   --started on Eraxis for fungemia --ID consulted Plan: --d/c Unasyn today, per ID --cont Eraxis --LP today, per ID rec --consult cardiology today for TEE --consider ophtho consult  Hemorrhagic shock ruled out --Hypotension on presentation responsive to fluid resuscitation.   --patient only received 2 unit  of PRBC and 1 unit of FFP with unusually significant improvement in her hemoglobin to 11.8.  Initial Hgb of 2.7 is likely a lab error.  Gastric ulcer --history of significant NSAID use. --EGD found giant gastric ulcer that was not actively bleeding.  However, pt continued to have black diarrhea. Plan: --cont IV PPI gtt for 72 hours --soft diet, per GI rec --has trouble with pills so at discharge will need PPI in capsule form so she can break apart and take twice daily - no blood thinners unless absolutely necessary  Acute blood loss anemia --likely due to gastric ulcer.  Continued to have black diarrhea. --GI following --monitor Hgb  Generalized weakness and falls.   Since prior COVID infection in June 2022. -PT/OT evaluation  Hydrocephalus, and Spina Bifida s/p VP shunt  --Neurosurgery reprogrammed shunt today  Abnormal CT head.   There was some concern of new hyperdense right subdural collection, without evidence of acute blood products and a concern of acute-subacute infarct in the right parietal lobe. Neurology was consulted.  No new focal deficit.  Patient has baseline less strength on left. --MRI and MRAs  --Neurology following  Diarrhea --likely due to abx and anti-fungal use --No active C diff infection.  GI path neg --Imodium PRN   Objective: Vitals:   01/07/21 0536 01/07/21 0751 01/07/21 1127 01/07/21 1548  BP:  (!) 165/107 (!) 152/104 (!) 140/91  Pulse:  83 81 86  Resp:  Temp:  (!) 97.5 F (36.4 C) 98.2 F (36.8 C) 97.7 F (36.5 C)  TempSrc:  Oral Oral Oral  SpO2:  97% 98% 98%  Weight: 50.9 kg     Height:        Intake/Output Summary (Last 24 hours) at 01/07/2021 1610 Last data filed at 01/07/2021 1146 Gross per 24 hour  Intake 105.18 ml  Output 250 ml  Net -144.82 ml   Filed Weights   01/05/21 0426 01/06/21 0500 01/07/21 0536  Weight: 48.5 kg 49.6 kg 50.9 kg    Examination:  Constitutional: NAD, AAOx3 HEENT: conjunctivae and lids  normal, EOMI CV: No cyanosis.   RESP: normal respiratory effort, on RA Extremities: No effusions, edema in BLE SKIN: warm, dry Neuro: II - XII grossly intact.   Psych: depressed mood and affect.     DVT prophylaxis: SCDs, concern of GI bleed Code Status: Full Family Communication:  Disposition Plan:  Status is: Inpatient   Dispo: The patient is from: Home              Anticipated d/c is to: Home              Patient currently is not medically stable to d/c.  On V antifungal, pending neurosurgery and neuro eval.    Difficult to place patient No                 All the records are reviewed and case discussed with Care Management/Social Worker. Management plans discussed with the patient, nursing and they are  in agreement.  Consultants:  GI PCCM  Procedures:  Antimicrobials:  Unasyn  Data Reviewed: I have personally reviewed following labs and imaging studies  CBC: Recent Labs  Lab 01/04/21 1205 01/04/21 1458 01/04/21 1547 01/04/21 1915 01/05/21 0132 01/05/21 0540 01/06/21 0415 01/07/21 0455  WBC 30.2*  --   --   --   --  24.2* 18.7* 11.3*  NEUTROABS 27.0*  --   --   --   --   --   --   --   HGB 2.7*   < >  --  11.6* 11.8* 11.8* 11.9* 9.4*  HCT 10.4*   < >  --  33.2* 33.7* 34.7* 36.3 29.4*  MCV 65.0*  --   --   --   --  75.4* 81.4 80.5  PLT 721*  --  372 406*  --  372 288 226   < > = values in this interval not displayed.   Basic Metabolic Panel: Recent Labs  Lab 01/04/21 1205 01/05/21 0540 01/06/21 0415 01/07/21 0455  NA 135 138 141 136  K 3.4* 4.1 4.6 4.2  CL 103 107 109 104  CO2 23 21* 23 24  GLUCOSE 144* 122* 124* 110*  BUN 36* 21* 13 15  CREATININE 0.86 0.75 0.71 0.62  CALCIUM 8.0* 8.1* 8.6* 8.1*  MG  --  2.0  --  2.0  PHOS  --  2.9  --  2.9   GFR: Estimated Creatinine Clearance: 64.5 mL/min (by C-G formula based on SCr of 0.62 mg/dL). Liver Function Tests: Recent Labs  Lab 01/04/21 1205  AST 20  ALT 13  ALKPHOS 82  BILITOT 0.6   PROT 5.5*  ALBUMIN 2.1*   Recent Labs  Lab 01/04/21 1205  LIPASE 50   No results for input(s): AMMONIA in the last 168 hours. Coagulation Profile: Recent Labs  Lab 01/04/21 1205 01/04/21 1547 01/04/21 1915 01/05/21 0540  INR 1.2 1.2 1.1 1.1   Cardiac Enzymes: Recent Labs  Lab 01/04/21 1205  CKTOTAL 85   BNP (last 3 results) No results for input(s): PROBNP in the last 8760 hours. HbA1C: Recent Labs    01/05/21 0540  HGBA1C 5.4   CBG: Recent Labs  Lab 01/04/21 1704  GLUCAP 100*   Lipid Profile: Recent Labs    01/05/21 0540  CHOL 105  HDL 15*  LDLCALC 45  TRIG 295*  CHOLHDL 7.0   Thyroid Function Tests: No results for input(s): TSH, T4TOTAL, FREET4, T3FREE, THYROIDAB in the last 72 hours. Anemia Panel: Recent Labs    01/04/21 1915 01/05/21 0538 01/05/21 0539 01/05/21 1417  VITAMINB12 364  --  269  --   FOLATE  --  8.0  --   --   FERRITIN  --  28  --   --   TIBC  --  322  --   --   IRON  --  31  --   --   RETICCTPCT  --   --   --  2.0   Sepsis Labs: Recent Labs  Lab 01/04/21 1205 01/04/21 1547 01/05/21 0538 01/05/21 0540 01/06/21 0415  PROCALCITON 24.93  --  10.66  --   --   LATICACIDVEN 3.6* 2.5*  --  2.1* 1.2    Recent Results (from the past 240 hour(s))  Resp Panel by RT-PCR (Flu A&B, Covid) Nasopharyngeal Swab     Status: None   Collection Time: 01/04/21 12:05 PM   Specimen: Nasopharyngeal Swab; Nasopharyngeal(NP) swabs in vial transport  medium  Result Value Ref Range Status   SARS Coronavirus 2 by RT PCR NEGATIVE NEGATIVE Final    Comment: (NOTE) SARS-CoV-2 target nucleic acids are NOT DETECTED.  The SARS-CoV-2 RNA is generally detectable in upper respiratory specimens during the acute phase of infection. The lowest concentration of SARS-CoV-2 viral copies this assay can detect is 138 copies/mL. A negative result does not preclude SARS-Cov-2 infection and should not be used as the sole basis for treatment or other patient  management decisions. A negative result may occur with  improper specimen collection/handling, submission of specimen other than nasopharyngeal swab, presence of viral mutation(s) within the areas targeted by this assay, and inadequate number of viral copies(<138 copies/mL). A negative result must be combined with clinical observations, patient history, and epidemiological information. The expected result is Negative.  Fact Sheet for Patients:  BloggerCourse.com  Fact Sheet for Healthcare Providers:  SeriousBroker.it  This test is no t yet approved or cleared by the [place] FDA and  has been authorized for detection and/or diagnosis of SARS-CoV-2 by FDA under an Emergency Use Authorization (EUA). This EUA will remain  in effect (meaning this test can be used) for the duration of the COVID-19 declaration under Section 564(b)(1) of the Act, 21 U.S.C.section 360bbb-3(b)(1), unless the authorization is terminated  or revoked sooner.       Influenza A by PCR NEGATIVE NEGATIVE Final   Influenza B by PCR NEGATIVE NEGATIVE Final    Comment: (NOTE) The Xpert Xpress SARS-CoV-2/FLU/RSV plus assay is intended as an aid in the diagnosis of influenza from Nasopharyngeal swab specimens and should not be used as a sole basis for treatment. Nasal washings and aspirates are unacceptable for Xpert Xpress SARS-CoV-2/FLU/RSV testing.  Fact Sheet for Patients: BloggerCourse.com  Fact Sheet for Healthcare Providers: SeriousBroker.it  This test is not yet approved or cleared by the [place] FDA and has been authorized for detection and/or diagnosis of SARS-CoV-2 by FDA under an Emergency Use Authorization (EUA). This EUA will remain in effect (meaning this test can be used) for the duration of the COVID-19 declaration under Section 564(b)(1) of the Act, 21 U.S.C. section 360bbb-3(b)(1),  unless the authorization is terminated or revoked.  Performed at [facility], [address] [place], [place] [address]   Blood culture (routine single)     Status: Abnormal (Preliminary result)   Collection Time: 01/04/21  2:58 PM   Specimen: BLOOD  Result Value Ref Range Status   Specimen Description   Final    BLOOD LEFT ANTECUBITAL Performed at [facility], [address], [place], [place] [address]    Special Requests   Final    BOTTLES DRAWN AEROBIC AND ANAEROBIC Blood Culture adequate volume Performed at [facility], [address] [place], [place] [address]    Culture  Setup Time   Final    YEAST ANAEROBIC BOTTLE ONLY Organism ID to follow CRITICAL RESULT CALLED TO, READ BACK BY AND VERIFIED WITH: SAMANTHA RAUER 01/05/21 1507 SLM IN BOTH AEROBIC AND ANAEROBIC BOTTLES Performed at [facility], [address] [place], [place] [address]    Culture (A)  Final    CANDIDA KRUSEI Sent to [facility] for further susceptibility testing. Performed at [facility], [address] [place], [place] [address]    Report Status PENDING  Incomplete  Blood Culture ID Panel (Reflexed)     Status: Abnormal   Collection Time: 01/04/21  2:58 PM  Result Value Ref Range Status  Enterococcus faecalis NOT DETECTED NOT DETECTED Final   Enterococcus Faecium NOT DETECTED NOT DETECTED Final   Listeria monocytogenes NOT DETECTED NOT DETECTED Final   Staphylococcus species NOT DETECTED NOT DETECTED Final   Staphylococcus aureus (BCID) NOT DETECTED NOT DETECTED Final   Staphylococcus epidermidis NOT DETECTED NOT DETECTED Final   Staphylococcus lugdunensis NOT DETECTED NOT DETECTED Final   Streptococcus species NOT DETECTED NOT DETECTED Final   Streptococcus agalactiae NOT DETECTED NOT DETECTED Final   Streptococcus pneumoniae NOT DETECTED NOT DETECTED Final   Streptococcus pyogenes NOT DETECTED NOT DETECTED Final   A.calcoaceticus-baumannii NOT  DETECTED NOT DETECTED Final   Bacteroides fragilis NOT DETECTED NOT DETECTED Final   Enterobacterales NOT DETECTED NOT DETECTED Final   Enterobacter cloacae complex NOT DETECTED NOT DETECTED Final   Escherichia coli NOT DETECTED NOT DETECTED Final   Klebsiella aerogenes NOT DETECTED NOT DETECTED Final   Klebsiella oxytoca NOT DETECTED NOT DETECTED Final   Klebsiella pneumoniae NOT DETECTED NOT DETECTED Final   Proteus species NOT DETECTED NOT DETECTED Final   Salmonella species NOT DETECTED NOT DETECTED Final   Serratia marcescens NOT DETECTED NOT DETECTED Final   Haemophilus influenzae NOT DETECTED NOT DETECTED Final   Neisseria meningitidis NOT DETECTED NOT DETECTED Final   Pseudomonas aeruginosa NOT DETECTED NOT DETECTED Final   Stenotrophomonas maltophilia NOT DETECTED NOT DETECTED Final   Candida albicans NOT DETECTED NOT DETECTED Final   Candida auris NOT DETECTED NOT DETECTED Final   Candida glabrata NOT DETECTED NOT DETECTED Final   Candida krusei DETECTED (A) NOT DETECTED Final    Comment: CRITICAL RESULT CALLED TO, READ BACK BY AND VERIFIED WITH: SAMANTHA RAUER 01/05/21 1507 SLM    Candida parapsilosis NOT DETECTED NOT DETECTED Final   Candida tropicalis NOT DETECTED NOT DETECTED Final   Cryptococcus neoformans/gattii NOT DETECTED NOT DETECTED Final    Comment: Performed at [facility], [address] [place], [place] [address]  MRSA Next Gen by PCR, Nasal     Status: None   Collection Time: 01/04/21  4:58 PM   Specimen: Nasal Mucosa; Nasal Swab  Result Value Ref Range Status   MRSA by PCR Next Gen NOT DETECTED NOT DETECTED Final    Comment: (NOTE) The GeneXpert MRSA Assay (FDA approved for NASAL specimens only), is one component of a comprehensive MRSA colonization surveillance program. It is not intended to diagnose MRSA infection nor to guide or monitor treatment for MRSA infections. Test performance is not FDA approved in patients less than 85  years old. Performed at [facility], [address] [place], [place] [address]   Urine Culture     Status: Abnormal   Collection Time: 01/04/21  8:50 PM   Specimen: Urine, Clean Catch  Result Value Ref Range Status   Specimen Description   Final    URINE, CLEAN CATCH Performed at [facility], [address], [place], [place] [address]    Special Requests   Final    NONE Performed at [facility], [address], [place], [place] [address]    Culture (A)  Final    <10,000 COLONIES/mL INSIGNIFICANT GROWTH Performed at [facility], [address] [place], [place] [address]    Report Status 01/07/2021 FINAL  Final  Gastrointestinal Panel by PCR , Stool     Status: None   Collection Time: 01/05/21  3:01 PM   Specimen: Stool  Result Value Ref Range Status   Campylobacter species NOT DETECTED NOT DETECTED  Final   Plesimonas shigelloides NOT DETECTED NOT DETECTED Final   Salmonella species NOT DETECTED NOT DETECTED Final   Yersinia enterocolitica NOT DETECTED NOT DETECTED Final   Vibrio species NOT DETECTED NOT DETECTED Final   Vibrio cholerae NOT DETECTED NOT DETECTED Final   Enteroaggregative E coli (EAEC) NOT DETECTED NOT DETECTED Final   Enteropathogenic E coli (EPEC) NOT DETECTED NOT DETECTED Final   Enterotoxigenic E coli (ETEC) NOT DETECTED NOT DETECTED Final   Shiga like toxin producing E coli (STEC) NOT DETECTED NOT DETECTED Final   Shigella/Enteroinvasive E coli (EIEC) NOT DETECTED NOT DETECTED Final   Cryptosporidium NOT DETECTED NOT DETECTED Final   Cyclospora cayetanensis NOT DETECTED NOT DETECTED Final   Entamoeba histolytica NOT DETECTED NOT DETECTED Final   Giardia lamblia NOT DETECTED NOT DETECTED Final   Adenovirus F40/41 NOT DETECTED NOT DETECTED Final   Astrovirus NOT DETECTED NOT DETECTED Final   Norovirus GI/GII NOT DETECTED NOT DETECTED Final   Rotavirus A NOT DETECTED NOT DETECTED Final   Sapovirus (I, II, IV, and  V) NOT DETECTED NOT DETECTED Final    Comment: Performed at [facility], [address] [place], [place] [address]  C Difficile Quick Screen w PCR reflex     Status: Abnormal   Collection Time: 01/05/21  3:01 PM   Specimen: STOOL  Result Value Ref Range Status   C Diff antigen POSITIVE (A) NEGATIVE Final   C Diff toxin NEGATIVE NEGATIVE Final   C Diff interpretation Results are indeterminate. See PCR results.  Final    Comment: Performed at [facility], [address] [place], [place] [address]  C. Diff by PCR, Reflexed     Status: None   Collection Time: 01/05/21  3:01 PM  Result Value Ref Range Status   Toxigenic C. Difficile by PCR NEGATIVE NEGATIVE Final    Comment: Patient is colonized with non toxigenic C. difficile. May not need treatment unless significant symptoms are present. Performed at [facility], [address] [place], [place] [address]   Culture, blood (single)     Status: None (Preliminary result)   Collection Time: 01/05/21  4:42 PM   Specimen: BLOOD  Result Value Ref Range Status   Specimen Description BLOOD BLOOD LEFT HAND  Final   Special Requests   Final    BOTTLES DRAWN AEROBIC AND ANAEROBIC Blood Culture adequate volume   Culture   Final    NO GROWTH 2 DAYS Performed at [facility], [address] [place], [place] [address]    Report Status PENDING  Incomplete  CSF culture w Gram Stain     Status: None (Preliminary result)   Collection Time: 01/07/21  2:43 PM   Specimen: PATH Cytology CSF; Cerebrospinal Fluid  Result Value Ref Range Status   Specimen Description CSF  Final   Special Requests NONE  Final   Gram Stain   Final    NO ORGANISMS SEEN WBC PRESENT, PREDOMINANTLY PMN Performed at [facility], [address], [place], [place] [address]    Culture PENDING  Incomplete   Report Status PENDING  Incomplete     Radiology Studies: CT ANGIO HEAD NECK W WO CM  Result Date:  01/06/2021 CLINICAL DATA:  Stroke, follow-up EXAM: CT ANGIOGRAPHY HEAD AND NECK TECHNIQUE: Multidetector CT imaging of the head and neck was performed using the standard protocol during bolus administration of intravenous contrast. Multiplanar CT image reconstructions and MIPs were obtained to evaluate the vascular anatomy.  Carotid stenosis measurements (when applicable) are obtained utilizing NASCET criteria, using the distal internal carotid diameter as the denominator. CONTRAST:  65mL OMNIPAQUE IOHEXOL 350 MG/ML SOLN COMPARISON:  CT brain 01/04/2021 MRI brain and MRA head and neck 01/06/2021 FINDINGS: CT HEAD FINDINGS Brain: Redemonstrated tethering of the right parietal cortex to a right parietal burr hole. 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 No new evidence of infarction. Redemonstrated right cerebral convexity fluid collection, measuring to 7 mm. Right frontal approach ventriculostomy catheter, with tip approximating the interventricular septum. Unchanged size and configuration of the ventricular system. No acute hemorrhage, mass, mass effect, or midline shift. Vascular: See CTA findings. Skull: Right frontal and left parietal burr holes. No acute osseous abnormality.Prior suboccipital craniectomy. Sinuses: Imaged portions are clear. Orbits: No acute finding. Review of the MIP images confirms the above findings CTA NECK FINDINGS Aortic arch: 2 vessel arch, with common origin of the brachiocephalic and right common carotid arteries. Imaged portion shows no evidence of aneurysm or dissection. No significant stenosis of the major arch vessel origins. Right carotid system: Complete occlusion of the right internal carotid artery, from the level of the bifurcation to the mid petrous portion (series 14, image 177 and 125). The right common and external carotid arteries appear patent and without significant stenosis or occlusion. Left carotid system: No  evidence of dissection, stenosis (50% or greater) or occlusion. Retropharyngeal left internal carotid artery. Vertebral arteries: Non opacification of the majority of the left vertebral artery, with minimal thread-like reconstitution in the mid cervical spine (series 14, image 126), with diminutive left V3 and V4 segments. The right vertebral artery is patent from its origin to the vertebrobasilar junction. Skeleton: No acute osseous abnormality. Other neck: Negative Upper chest: Diffuse ground-glass and nodular opacities in the right greater than left imaged lungs. Review of the MIP images confirms the above findings CTA HEAD FINDINGS Anterior circulation: The right intracranial internal carotid artery reconstitutes in the petrous portion (series 14, image 177) but remains narrow for the remainder of its course to the terminus. Diminutive right MCA, which appears patent. The right A1 is diminutive but patent. Distal right MCA branches appear perfused. The left intracranial internal carotid artery is patent to the terminus, without stenosis or other abnormality. The left A1 and more distal ACA segments are patent. Normal anterior communicating artery. Normal left M1, bifurcation, and distal segments patent. Posterior circulation: The right vertebral artery is patent to the vertebrobasilar junction. Thread-like left V4 segment, which may terminate in PICA. The basilar artery is patent. Superior cerebellar arteries and posterior cerebral arteries are patent. Venous sinuses: As permitted by contrast timing, patent. Anatomic variants: None significant Review of the MIP images confirms the above findings IMPRESSION: 1. 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 2. Non opacification of the proximal left vertebral artery from its origin, with thread-like enhancement in the mid V2 segment, extending to the V3 and V4 segments. 3. Diminutive  right MCA and ACA, which appear patent. No additional intracranial large vessel occlusion. 4. Extensive ground-glass opacities in the imaged lungs, which are new compared to 01/04/2021, concerning for infection. Consider repeat chest CT. 5. Redemonstrated tethering of the right parietal cortex to a right parietal burr hole, with cortical hypodensity likely representing infarcts of varying ages, better evaluated on same-day MRI. These results were called by telephone at the time of interpretation on 01/06/2021 at 6:58 pm  to provider Dr. Fran Lowes, who verbally acknowledged these results. Electronically Signed   By: Wiliam Ke M.D.   On: 01/06/2021 18:59   DG Skull 1-3 Views  Result Date: 01/06/2021 CLINICAL DATA:  Complication of ventricular shunt, lateral view only EXAM: SKULL - 1-3 VIEW COMPARISON:  01/04/2021 FINDINGS: Single lateral view of the skull. Shunt set at 100 mm of H2O, with shunt dial at a slightly different angle than on 01/04/2021. No evidence of skull fracture or other focal bone lesion. Otherwise unchanged appearance of right frontal approach ventriculostomy catheter. IMPRESSION: Shunt set at 100 mm H2O. Electronically Signed   By: Wiliam Ke M.D.   On: 01/06/2021 14:27   MR ANGIO HEAD WO CONTRAST  Result Date: 01/06/2021 CLINICAL DATA:  Stroke follow-up EXAM: MRI HEAD WITHOUT AND WITH CONTRAST MRA HEAD WITHOUT CONTRAST MRA NECK WITHOUT AND WITH CONTRAST TECHNIQUE: Multiplanar, multiecho pulse sequences of the brain and surrounding structures were obtained without and with intravenous contrast. Angiographic images of the head were obtained using MRA technique without contrast. Angiographic images of the neck were acquired using MRA technique without and with intravenous contrast. Carotid stenosis measurements (when applicable) are obtained utilizing NASCET criteria, using the distal internal carotid diameter as the denominator. CONTRAST:  35mL GADAVIST GADOBUTROL 1 MMOL/ML IV SOLN  COMPARISON:  01/04/2021 and 10/12/2019 CT head, no prior MRI FINDINGS: MRI HEAD FINDINGS Brain: Evaluation is somewhat limited by susceptibility artifact from overlying shunt hardware. 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 Tethering of a portion of the right lateral parietal lobe, extending to the calvarium and the location of a prior right parietal burr hole (series 16, image 96). Possible area of restricted diffusion in the posterior right temporal lobe (series 5, image 25). Additional areas of increased signal on diffusion-weighted imaging are without definite ADC correlate and may represent remote infarcts. Fluid collection measuring up to 6 mm along the right parietal convexity in this area, without evidence of hemorrhage or proteinaceous fluid; the fluid collection follows CSF signal on all sequences. Mild diffuse dural thickening. No definite abnormal enhancement. Right frontal approach ventriculostomy catheter with tip approximating the body of the left lateral ventricle. No hydrocephalus. Status post prior suboccipital craniectomy with unchanged appearance of the cerebellum and tonsils compared to Vascular: See MRA findings, below. Skull and upper cervical spine: Status post suboccipital craniectomy. Right frontal and parietal burr holes. Otherwise normal marrow signal. Sinuses/Orbits: Negative. Other: The mastoids are well aerated. MRA HEAD FINDINGS No definite flow seen in the right intracranial internal carotid artery, with flow reconstituting in the supraclinoid segment. The right intracranial internal carotid artery is patent to the terminus, without stenosis or other abnormality. A1 segments patent, although there is somewhat poor flow in the right A1. Anterior communicating artery not well visualized. Possible fusiform aneurysmal dilatation of the proximal right A2 (series 1050,  image 108), although this may be artifactual. Anterior cerebral arteries are patent to their distal aspects. Normal left MCA, with distal branches well perfused. The right M1 is somewhat irregular; although the distal branches appear perfused, they are less perfused than the left MCA branches. Right vertebral artery is patent to the vertebrobasilar junction without stenosis. Variant branching at the distal basilar artery, which bifurcates before giving off the PCA and SCA bilaterally. Distal PCA branches appear well perfused. MRA NECK FINDINGS Aortic arch: Standard branching. No evidence of dissection or stenosis. Right carotid system: Severe focal stenosis  at the origin of the ICA (series 1107, image 38), with occlusion slightly more distally. The right ICA is not definitively seen for the remainder of extracranial course. Left carotid system: Patent without significant stenosis peer Vertebral arteries: The right vertebral artery is well visualized and appears patent, without hemodynamically significant stenosis. The left vertebral artery is not visualized from just distal to its origin lesion. Other: None IMPRESSION: 1. Tethering of a segment of the right parietal cortex to a right parietal burr hole. 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 Small focus of restricted diffusion within the posterior right temporal lobe may reflect a small more acute infarct. 2. Dural thickening, suggestive of intracranial hypotension, possibly due to over shunting. This could also explain the apparent right parietal convexity extra-axial collection, which would accentuate and exacerbate the tethering. 3. Non opacification of the majority of the extra and intracranial right ICA, with reconstitution at the supraclinoid segment, likely via collaterals. Poor flow in the right ACA and MCA, with  possible fusiform dilatation of the right A2 segment. Consider CTA for better evaluation. 4. Non opacification of the left vertebral artery. These results were called by telephone at the time of interpretation on 01/06/2021 at 1:40 pm to provider MCNEILL [facility] , who verbally acknowledged these results. Electronically Signed   By: Wiliam Ke M.D.   On: 01/06/2021 13:42   MR ANGIO NECK W WO CONTRAST  Result Date: 01/06/2021 CLINICAL DATA:  Stroke follow-up EXAM: MRI HEAD WITHOUT AND WITH CONTRAST MRA HEAD WITHOUT CONTRAST MRA NECK WITHOUT AND WITH CONTRAST TECHNIQUE: Multiplanar, multiecho pulse sequences of the brain and surrounding structures were obtained without and with intravenous contrast. Angiographic images of the head were obtained using MRA technique without contrast. Angiographic images of the neck were acquired using MRA technique without and with intravenous contrast. Carotid stenosis measurements (when applicable) are obtained utilizing NASCET criteria, using the distal internal carotid diameter as the denominator. CONTRAST:  5mL GADAVIST GADOBUTROL 1 MMOL/ML IV SOLN COMPARISON:  01/04/2021 and 10/12/2019 CT head, no prior MRI FINDINGS: MRI HEAD FINDINGS Brain: Evaluation is somewhat limited by susceptibility artifact from overlying shunt hardware. 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 Tethering of a portion of the right lateral parietal lobe, extending to the calvarium and the location of a prior right parietal burr hole (series 16, image 96). Possible area of restricted diffusion in the posterior right temporal lobe (series 5, image 25). Additional areas of increased signal on diffusion-weighted imaging are without definite ADC correlate and may represent remote infarcts. Fluid collection measuring up to 6 mm along the right parietal convexity in this area, without  evidence of hemorrhage or proteinaceous fluid; the fluid collection follows CSF signal on all sequences. Mild diffuse dural thickening. No definite abnormal enhancement. Right frontal approach ventriculostomy catheter with tip approximating the body of the left lateral ventricle. No hydrocephalus. Status post prior suboccipital craniectomy with unchanged appearance of the cerebellum and tonsils compared to Vascular: See MRA findings, below. Skull and upper cervical spine: Status post suboccipital craniectomy. Right frontal and parietal burr holes. Otherwise normal marrow signal. Sinuses/Orbits: Negative. Other: The mastoids are well aerated. MRA HEAD FINDINGS No definite flow seen in the right intracranial internal carotid artery, with flow reconstituting in the supraclinoid segment. The right intracranial internal carotid artery  is patent to the terminus, without stenosis or other abnormality. A1 segments patent, although there is somewhat poor flow in the right A1. Anterior communicating artery not well visualized. Possible fusiform aneurysmal dilatation of the proximal right A2 (series 1050, image 108), although this may be artifactual. Anterior cerebral arteries are patent to their distal aspects. Normal left MCA, with distal branches well perfused. The right M1 is somewhat irregular; although the distal branches appear perfused, they are less perfused than the left MCA branches. Right vertebral artery is patent to the vertebrobasilar junction without stenosis. Variant branching at the distal basilar artery, which bifurcates before giving off the PCA and SCA bilaterally. Distal PCA branches appear well perfused. MRA NECK FINDINGS Aortic arch: Standard branching. No evidence of dissection or stenosis. Right carotid system: Severe focal stenosis at the origin of the ICA (series 1107, image 38), with occlusion slightly more distally. The right ICA is not definitively seen for the remainder of extracranial course.  Left carotid system: Patent without significant stenosis peer Vertebral arteries: The right vertebral artery is well visualized and appears patent, without hemodynamically significant stenosis. The left vertebral artery is not visualized from just distal to its origin lesion. Other: None IMPRESSION: 1. Tethering of a segment of the right parietal cortex to a right parietal burr hole. 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 Small focus of restricted diffusion within the posterior right temporal lobe may reflect a small more acute infarct. 2. Dural thickening, suggestive of intracranial hypotension, possibly due to over shunting. This could also explain the apparent right parietal convexity extra-axial collection, which would accentuate and exacerbate the tethering. 3. Non opacification of the majority of the extra and intracranial right ICA, with reconstitution at the supraclinoid segment, likely via collaterals. Poor flow in the right ACA and MCA, with possible fusiform dilatation of the right A2 segment. Consider CTA for better evaluation. 4. Non opacification of the left vertebral artery. These results were called by telephone at the time of interpretation on 01/06/2021 at 1:40 pm to provider MCNEILL [facility] , who verbally acknowledged these results. Electronically Signed   By: Wiliam Ke M.D.   On: 01/06/2021 13:42   MR BRAIN W WO CONTRAST  Result Date: 01/06/2021 CLINICAL DATA:  Stroke follow-up EXAM: MRI HEAD WITHOUT AND WITH CONTRAST MRA HEAD WITHOUT CONTRAST MRA NECK WITHOUT AND WITH CONTRAST TECHNIQUE: Multiplanar, multiecho pulse sequences of the brain and surrounding structures were obtained without and with intravenous contrast. Angiographic images of the head were obtained using MRA technique without contrast. Angiographic images of the neck were  acquired using MRA technique without and with intravenous contrast. Carotid stenosis measurements (when applicable) are obtained utilizing NASCET criteria, using the distal internal carotid diameter as the denominator. CONTRAST:  5mL GADAVIST GADOBUTROL 1 MMOL/ML IV SOLN COMPARISON:  01/04/2021 and 10/12/2019 CT head, no prior MRI FINDINGS: MRI HEAD FINDINGS Brain: Evaluation is somewhat limited by susceptibility artifact from overlying shunt hardware. 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 Tethering of a portion of the right lateral parietal lobe, extending to the calvarium and the location of a prior right parietal burr hole (series 16, image 96). Possible area of restricted diffusion in the posterior right temporal lobe (series 5, image 25). Additional areas of increased signal on diffusion-weighted imaging are without definite  ADC correlate and may represent remote infarcts. Fluid collection measuring up to 6 mm along the right parietal convexity in this area, without evidence of hemorrhage or proteinaceous fluid; the fluid collection follows CSF signal on all sequences. Mild diffuse dural thickening. No definite abnormal enhancement. Right frontal approach ventriculostomy catheter with tip approximating the body of the left lateral ventricle. No hydrocephalus. Status post prior suboccipital craniectomy with unchanged appearance of the cerebellum and tonsils compared to Vascular: See MRA findings, below. Skull and upper cervical spine: Status post suboccipital craniectomy. Right frontal and parietal burr holes. Otherwise normal marrow signal. Sinuses/Orbits: Negative. Other: The mastoids are well aerated. MRA HEAD FINDINGS No definite flow seen in the right intracranial internal carotid artery, with flow reconstituting in the supraclinoid segment. The right intracranial internal carotid artery  is patent to the terminus, without stenosis or other abnormality. A1 segments patent, although there is somewhat poor flow in the right A1. Anterior communicating artery not well visualized. Possible fusiform aneurysmal dilatation of the proximal right A2 (series 1050, image 108), although this may be artifactual. Anterior cerebral arteries are patent to their distal aspects. Normal left MCA, with distal branches well perfused. The right M1 is somewhat irregular; although the distal branches appear perfused, they are less perfused than the left MCA branches. Right vertebral artery is patent to the vertebrobasilar junction without stenosis. Variant branching at the distal basilar artery, which bifurcates before giving off the PCA and SCA bilaterally. Distal PCA branches appear well perfused. MRA NECK FINDINGS Aortic arch: Standard branching. No evidence of dissection or stenosis. Right carotid system: Severe focal stenosis at the origin of the ICA (series 1107, image 38), with occlusion slightly more distally. The right ICA is not definitively seen for the remainder of extracranial course. Left carotid system: Patent without significant stenosis peer Vertebral arteries: The right vertebral artery is well visualized and appears patent, without hemodynamically significant stenosis. The left vertebral artery is not visualized from just distal to its origin lesion. Other: None IMPRESSION: 1. Tethering of a segment of the right parietal cortex to a right parietal burr hole. 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 Small focus of restricted diffusion within the posterior right temporal lobe may reflect a small more acute infarct. 2. Dural thickening, suggestive of intracranial hypotension, possibly due to over shunting. This could also explain the apparent right parietal  convexity extra-axial collection, which would accentuate and exacerbate the tethering. 3. Non opacification of the majority of the extra and intracranial right ICA, with reconstitution at the supraclinoid segment, likely via collaterals. Poor flow in the right ACA and MCA, with possible fusiform dilatation of the right A2 segment. Consider CTA for better evaluation. 4. Non opacification of the left vertebral artery. These results were called by telephone at the time of interpretation on 01/06/2021 at 1:40 pm to provider MCNEILL [facility] , who verbally acknowledged these results. Electronically Signed   By: Wiliam Ke M.D.   On: 01/06/2021 13:42   DG FL GUIDED LUMBAR PUNCTURE  Result Date: 01/07/2021 CLINICAL DATA:  Patient with sepsis and a VP shunt request received for lumbar puncture. EXAM: DIAGNOSTIC LUMBAR PUNCTURE UNDER FLUOROSCOPIC GUIDANCE COMPARISON:  MR brain imaging performed 01/06/2021 report reviewed prior to the procedure. FLUOROSCOPY TIME:  Fluoroscopy Time:  0.2 minute Radiation Exposure Index (if provided by the fluoroscopic device): 3.0 mGy Number of Acquired Spot Images:  2 PROCEDURE: Informed consent was obtained from the patient prior to the procedure, including potential complications of infection, bleeding, CSF leak requiring additional procedures, paresthesia, headache, allergy, and pain. With the patient prone, the lower back was prepped with Betadine. 1% Lidocaine was used for local anesthesia. Lumbar puncture was performed at the L3-L4 level using a 20 gauge needle with return of colorless CSF with an opening pressure of 20 cm water. Eight ml of CSF were obtained for laboratory studies. The patient tolerated the procedure well and there were no apparent complications. A sterile bandage was applied. IMPRESSION: Technically successful fluoroscopic guided lumbar puncture; opening pressure of 20 cm water. Procedure performed by Pattricia Boss PA-C, supervised by Lesia Hausen.  Electronically Signed   By: Lesia Hausen M.D.   On: 01/07/2021 14:55    Scheduled Meds:  budesonide (PULMICORT) nebulizer solution  0.5 mg Nebulization BID   Chlorhexidine Gluconate Cloth  6 each Topical Q0600   methylPREDNISolone (SOLU-MEDROL) injection  20 mg Intravenous Q12H   [START ON 01/08/2021] pantoprazole  40 mg Intravenous Q12H   Continuous Infusions:  anidulafungin 100 mg (01/07/21 1515)     LOS: 3 days    Darlin Priestly, MD [facility]  If 7PM-7AM, please contact night-coverage [email or web address]  01/07/2021, 4:10 PM

## 2021-01-07 NOTE — Procedures (Signed)
After reviewing the clinical situation with the neurology and medicine team, and with the patient, I recommended reprogramming the shunt.  On the skull xray from yesterday, the shunt had been changed to 100 by the magnetic field of the MRI.  After reviewing with the patient, I sited the shunt programmer over her valve and programmed the shunt to 160. Confirmation was obtained from the programmer.  No complications noted.  Venetia Night MD

## 2021-01-07 NOTE — TOC Progression Note (Signed)
Transition of Care [facility]) - Progression Note    Patient Details  Name: Carolyn Herring MRN: [id number] Date of Birth: 08-18-1975  Transition of Care [facility] CM/SW Contact  Allayne Butcher, RN Phone Number: 01/07/2021, 2:09 PM  Clinical Narrative:    Patient is requiring IV antibiotics and IV antifungals.  SNF has been recommended.  Bed search started but no bed offers as of yet.     Expected Discharge Plan: Skilled Nursing Facility Barriers to Discharge: Continued Medical Work up  Expected Discharge Plan and Services Expected Discharge Plan: Skilled Nursing Facility In-house Referral: Clinical Social Work   Post Acute Care Choice: Skilled Nursing Facility Living arrangements for the past 2 months: Single Family Home                                       Social Determinants of Health (SDOH) Interventions    Readmission Risk Interventions No flowsheet data found.

## 2021-01-07 NOTE — Progress Notes (Signed)
Subjective: No further neurological symptoms.   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  The acute diffusion change seen on MRI is due to watershed injury from hypotension. She already has very good LDL, so no need for more aggressive lipid therapy. I doubt ASA would play a significant role in stroke prevention, and with the recent GI bleed, would not favor starting it.   Neurosurgery has adjusted the shunt to try to reduce overshunting. 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   Recommendations: 1) Outpatient follow up with neurology 2) Further determination of shunt infection per ID/nsgy  Ritta Slot, MD [facility] [phone]  If 7pm- 7am, please page neurology on call as listed in AMION.

## 2021-01-07 NOTE — Progress Notes (Addendum)
[facility] has been requested to perform a transesophageal echocardiogram on Carolyn Herring for fungemia.  After careful review of history and examination, the risks and benefits of transesophageal echocardiogram have been explained including risks of esophageal damage, perforation (1:10,000 risk), bleeding, pharyngeal hematoma as well as other potential complications associated with conscious sedation including aspiration, arrhythmia, respiratory failure and death. Alternatives to treatment were discussed, questions were answered. Carolyn Herring is willing to proceed.   Carolyn Herring is a 45yo female with pmh of HTN, hydrocephalus, spina bifida s/p VP shunt with cervical tethered cord release in 2011 last revised in 2019 who was admitted with falls and weakness found to have significant anemia. CTH with possible subacute infarct and small SDH. EGD showed large ulcer from NSAIDS. WBC initially 30x, but no fevers. BCX showed Candida Krusei and ID was consulted. Echo showed possible abnormality of the mitral valve and subsequently, a TEE was ordered. Carolyn Herring reports she requires general sedation given history of combativeness.   Constitutional: NAD, AAOx3 HEENT: conjunctivae and lids normal, EOMI CV: No cyanosis.   RESP: normal respiratory effort, on RA Extremities: No effusions, edema in BLE SKIN: warm, dry Neuro: II - XII grossly intact.   Psych: depressed mood and affect.  Appropriate judgement and reason    Cadence David Stall, PA-C  01/07/2021 2:16 PM     Cardiology Consultation:   Carolyn Herring ID: Carolyn Herring MRN: [id number]; DOB: Sep 22, 1975  Admit date: 01/04/2021 Date of Consult: 01/07/2021  PCP:  Carolyn Herring, No Pcp Per (Inactive)   [facility] Providers Cardiologist: New to [facility] Physician requesting consult: Dr. Fran Lowes Reason for consult: TEE  Carolyn Herring Profile:   Carolyn Herring is a 45 y.o. female with a hx of  HTN, hydrocephalus, spina bifida s/p VP shunt with cervical tethered  cord release in 2011 last revised in 2019 who was admitted with falls and weakness found to have significant anemia.  TEE requested for rule out endocarditis, cultures growing Candida    History of Present Illness:   Carolyn Herring is a 45yo female with CTH with possible subacute infarct and small SDH. EGD showed large ulcer from NSAIDS. WBC initially 30x, but no fevers. BCX showed Candida Krusei and ID was consulted.  Echo reviewed personally by myself,  ID concerned of possible abnormality of the mitral valve  TEE was ordered.  Carolyn Herring reports she requires general sedation given history of combativeness.  Currently laying supine, comfortable, family at the bedside including her brother   Past Medical History:  Diagnosis Date   Hypertension    Spina bifida [facility])     Past Surgical History:  Procedure Laterality Date   ESOPHAGOGASTRODUODENOSCOPY (EGD) WITH PROPOFOL N/A 01/05/2021   Procedure: ESOPHAGOGASTRODUODENOSCOPY (EGD) WITH PROPOFOL;  Surgeon: Regis Bill, MD;  Location: [facility] ENDOSCOPY;  Service: Endoscopy;  Laterality: N/A;     Home Medications:  Prior to Admission medications   Medication Sig Start Date End Date Taking? Authorizing Provider  ibuprofen (ADVIL) 200 MG tablet Take 400 mg by mouth every 6 (six) hours as needed for moderate pain.   Yes [provider]  diphenhydrAMINE (BENADRYL) 25 MG tablet Take 1 tablet (25 mg total) by mouth every 6 (six) hours as needed. Carolyn Herring not taking: Reported on 01/04/2021 10/12/19   Sharman Cheek, MD  ibuprofen (ADVIL,MOTRIN) 600 MG tablet Take 1 tablet (600 mg total) by mouth every 8 (eight) hours as needed for moderate pain. Carolyn Herring not taking: Reported on 01/04/2021 10/21/17   Ronnie Doss  Marcelyn Bruins, PA-C    Inpatient Medications: Scheduled Meds:  budesonide (PULMICORT) nebulizer solution  0.5 mg Nebulization BID   Chlorhexidine Gluconate Cloth  6 each Topical Q0600   methylPREDNISolone (SOLU-MEDROL) injection   20 mg Intravenous Q12H   [START ON 01/08/2021] pantoprazole  40 mg Intravenous Q12H   Continuous Infusions:  anidulafungin 100 mg (01/07/21 1515)   PRN Meds: acetaminophen, hydrALAZINE, ipratropium-albuterol, loperamide, ondansetron (ZOFRAN) IV  Allergies:    Allergies  Allergen Reactions   Tape Dermatitis    **BLISTERS** **BLISTERS**    Codeine Itching   Hydrogen Peroxide Other (See Comments)    Skin Irritation   Latex Other (See Comments)    Skin Irritation; blisters     Social History:   Social History   Socioeconomic History   Marital status: Divorced    Spouse name: Not on file   Number of children: Not on file   Years of education: Not on file   Highest education level: Not on file  Occupational History   Not on file  Tobacco Use   Smoking status: Every Day    Packs/day: 0.50    Types: Cigarettes   Smokeless tobacco: Never  Vaping Use   Vaping Use: Never used  Substance and Sexual Activity   Alcohol use: Never   Drug use: Never   Sexual activity: Not on file  Other Topics Concern   Not on file  Social History Narrative   Not on file   Social Determinants of Health   Financial Resource Strain: Not on file  Food Insecurity: Not on file  Transportation Needs: Not on file  Physical Activity: Not on file  Stress: Not on file  Social Connections: Not on file  Intimate Partner Violence: Not on file    Family History:   History reviewed. No pertinent family history.   ROS:  Please see the history of present illness.  Review of Systems  Constitutional: Negative.   HENT: Negative.    Respiratory: Negative.    Cardiovascular: Negative.   Gastrointestinal: Negative.   Musculoskeletal: Negative.   Neurological: Negative.   Psychiatric/Behavioral: Negative.    All other systems reviewed and are negative.   Physical Exam/Data:   Vitals:   01/07/21 0536 01/07/21 0751 01/07/21 1127 01/07/21 1548  BP:  (!) 165/107 (!) 152/104 (!) 140/91  Pulse:   83 81 86  Resp:  Temp:  (!) 97.5 F (36.4 C) 98.2 F (36.8 C) 97.7 F (36.5 C)  TempSrc:  Oral Oral Oral  SpO2:  97% 98% 98%  Weight: 50.9 kg     Height:        Intake/Output Summary (Last 24 hours) at 01/07/2021 1802 Last data filed at 01/07/2021 1146 Gross per 24 hour  Intake --  Output 250 ml  Net -250 ml   Last 3 Weights 01/07/2021 01/06/2021 01/05/2021  Weight (lbs) 112 lb 3.4 oz 109 lb 5.6 oz 106 lb 14.8 oz  Weight (kg) 50.9 kg 49.6 kg 48.5 kg     Body mass index is 21.92 kg/m.  General:  Well nourished, well developed, in no acute distress HEENT: normal Neck: no JVD Vascular: No carotid bruits; Distal pulses 2+ bilaterally Cardiac:  normal S1, S2; RRR; no murmur  Lungs:  clear to auscultation bilaterally, no wheezing, rhonchi or rales  Abd: soft, nontender, no hepatomegaly  Ext: no edema Musculoskeletal:  No deformities, BUE and BLE strength normal and equal Skin: warm and dry  Neuro:  CNs  2-12 intact, no focal abnormalities noted Psych:  Normal affect   EKG:  The EKG was personally reviewed and demonstrates:   Normal sinus rhythm/sinus tachycardia rate 113 bpm nonspecific ST abnormality, unable to exclude old inferior MI  Telemetry:  Telemetry was personally reviewed and demonstrates:   Normal sinus rhythm/sinus tachycardia  Relevant CV Studies: Echocardiogram January 05, 2021  1. Left ventricular ejection fraction, by estimation, is 55 to 60%. The  left ventricle has normal function. The left ventricle has no regional  wall motion abnormalities. Left ventricular diastolic parameters are  consistent with Grade I diastolic  dysfunction (impaired relaxation). Elevated left atrial pressure.   2. Right ventricular systolic function is normal. The right ventricular  size is normal.   3. The mitral valve is abnormal. Cannot exclude prolapse of posterior  leaflet or cleft. There is at least mild to moderate mitral valve  regurgitation. No evidence of  mitral stenosis.   4. The aortic valve is tricuspid. Aortic valve regurgitation is not  visualized. No aortic stenosis is present.   5. The inferior vena cava is normal in size with <50% respiratory  variability, suggesting right atrial pressure of 8 mmHg.   Laboratory Data:  High Sensitivity Troponin:   Recent Labs  Lab 01/04/21 1205 01/04/21 1547  TROPONINIHS 12 30*     Chemistry Recent Labs  Lab 01/05/21 0540 01/06/21 0415 01/07/21 0455  NA 138 141 136  K 4.1 4.6 4.2  CL 107 109 104  CO2 21* 23 24  GLUCOSE 122* 124* 110*  BUN 21* 13 15  CREATININE 0.75 0.71 0.62  CALCIUM 8.1* 8.6* 8.1*  MG 2.0  --  2.0  GFRNONAA >60 >60 >60  ANIONGAP 10 9 8     Recent Labs  Lab 01/04/21 1205  PROT 5.5*  ALBUMIN 2.1*  AST 20  ALT 13  ALKPHOS 82  BILITOT 0.6   Lipids  Recent Labs  Lab 01/05/21 0540  CHOL 105  TRIG 223*  HDL 15*  LDLCALC 45  CHOLHDL 7.0    Hematology Recent Labs  Lab 01/05/21 0540 01/05/21 1417 01/06/21 0415 01/07/21 0455  WBC 24.2*  --  18.7* 11.3*  RBC 4.60 4.42 4.46 3.65*  HGB 11.8*  --  11.9* 9.4*  HCT 34.7*  --  36.3 29.4*  MCV 75.4*  --  81.4 80.5  MCH 25.7*  --  26.7 25.8*  MCHC 34.0  --  32.8 32.0  RDW 21.8*  --  23.5* 23.8*  PLT 372  --  288 226   Thyroid No results for input(s): TSH, FREET4 in the last 168 hours.  BNPNo results for input(s): BNP, PROBNP in the last 168 hours.  DDimer  Recent Labs  Lab 01/04/21 1547 01/04/21 1915  DDIMER 0.57* 0.61*     Radiology/Studies:  CT ANGIO HEAD NECK W WO CM  Result Date: 01/06/2021 CLINICAL DATA:  Stroke, follow-up EXAM: CT ANGIOGRAPHY HEAD AND NECK TECHNIQUE: Multidetector CT imaging of the head and neck was performed using the standard protocol during bolus administration of intravenous contrast. Multiplanar CT image reconstructions and MIPs were obtained to evaluate the vascular anatomy. Carotid stenosis measurements (when applicable) are obtained utilizing NASCET criteria, using  the distal internal carotid diameter as the denominator. CONTRAST:  70mL OMNIPAQUE IOHEXOL 350 MG/ML SOLN COMPARISON:  CT brain 01/04/2021 MRI brain and MRA head and neck 01/06/2021 FINDINGS: CT HEAD FINDINGS Brain: Redemonstrated tethering of the right parietal cortex to a right parietal burr hole. Cortical hypodensity in  this area, likely subacute and chronic infarcts, unchanged from 01/04/2021. No new evidence of infarction. Redemonstrated right cerebral convexity fluid collection, measuring to 7 mm. Right frontal approach ventriculostomy catheter, with tip approximating the interventricular septum. Unchanged size and configuration of the ventricular system. No acute hemorrhage, mass, mass effect, or midline shift. Vascular: See CTA findings. Skull: Right frontal and left parietal burr holes. No acute osseous abnormality.Prior suboccipital craniectomy. Sinuses: Imaged portions are clear. Orbits: No acute finding. Review of the MIP images confirms the above findings CTA NECK FINDINGS Aortic arch: 2 vessel arch, with common origin of the brachiocephalic and right common carotid arteries. Imaged portion shows no evidence of aneurysm or dissection. No significant stenosis of the major arch vessel origins. Right carotid system: Complete occlusion of the right internal carotid artery, from the level of the bifurcation to the mid petrous portion (series 14, image 177 and 125). The right common and external carotid arteries appear patent and without significant stenosis or occlusion. Left carotid system: No evidence of dissection, stenosis (50% or greater) or occlusion. Retropharyngeal left internal carotid artery. Vertebral arteries: Non opacification of the majority of the left vertebral artery, with minimal thread-like reconstitution in the mid cervical spine (series 14, image 126), with diminutive left V3 and V4 segments. The right vertebral artery is patent from its origin to the vertebrobasilar junction. Skeleton:  No acute osseous abnormality. Other neck: Negative Upper chest: Diffuse ground-glass and nodular opacities in the right greater than left imaged lungs. Review of the MIP images confirms the above findings CTA HEAD FINDINGS Anterior circulation: The right intracranial internal carotid artery reconstitutes in the petrous portion (series 14, image 177) but remains narrow for the remainder of its course to the terminus. Diminutive right MCA, which appears patent. The right A1 is diminutive but patent. Distal right MCA branches appear perfused. The left intracranial internal carotid artery is patent to the terminus, without stenosis or other abnormality. The left A1 and more distal ACA segments are patent. Normal anterior communicating artery. Normal left M1, bifurcation, and distal segments patent. Posterior circulation: The right vertebral artery is patent to the vertebrobasilar junction. Thread-like left V4 segment, which may terminate in PICA. The basilar artery is patent. Superior cerebellar arteries and posterior cerebral arteries are patent. Venous sinuses: As permitted by contrast timing, patent. Anatomic variants: None significant Review of the MIP images confirms the above findings IMPRESSION: 1. 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 2. Non opacification of the proximal left vertebral artery from its origin, with thread-like enhancement in the mid V2 segment, extending to the V3 and V4 segments. 3. Diminutive right MCA and ACA, which appear patent. No additional intracranial large vessel occlusion. 4. Extensive ground-glass opacities in the imaged lungs, which are new compared to 01/04/2021, concerning for infection. Consider repeat chest CT. 5. Redemonstrated tethering of the right parietal cortex to a right parietal burr hole, with cortical hypodensity likely representing infarcts of varying ages, better evaluated on  same-day MRI. These results were called by telephone at the time of interpretation on 01/06/2021 at 6:58 pm to provider Dr. Fran Lowes, who verbally acknowledged these results. Electronically Signed   By: Wiliam Ke M.D.   On: 01/06/2021 18:59   DG Skull 1-3 Views  Result Date: 01/06/2021 CLINICAL DATA:  Complication of ventricular shunt, lateral view only EXAM: SKULL - 1-3 VIEW COMPARISON:  01/04/2021 FINDINGS: Single lateral view of the skull. Shunt set at  100 mm of H2O, with shunt dial at a slightly different angle than on 01/04/2021. No evidence of skull fracture or other focal bone lesion. Otherwise unchanged appearance of right frontal approach ventriculostomy catheter. IMPRESSION: Shunt set at 100 mm H2O. Electronically Signed   By: Wiliam Ke M.D.   On: 01/06/2021 14:27   DG Skull 1-3 Views  Result Date: 01/04/2021 CLINICAL DATA:  Evaluation for shunt EXAM: SKULL - 1-3 VIEW COMPARISON:  01/04/2021 CT head. FINDINGS: There is no evidence of skull fracture or other focal bone lesions. Right frontal approach ventriculostomy catheter, with shunt set at 110 mm H2O. No evidence of shunt discontinuity or kinking. IMPRESSION: Ventriculostomy catheter, with shunt set 110 mm of H2O, without evidence of shunt discontinuity or kinking. Electronically Signed   By: Wiliam Ke M.D.   On: 01/04/2021 18:47   CT HEAD WO CONTRAST ( )  Result Date: 01/04/2021 CLINICAL DATA:  Neck trauma, dangerous injury mechanism (Age 8-64y); Head trauma, focal neuro findings (Age 31-64y) EXAM: CT HEAD WITHOUT CONTRAST CT CERVICAL SPINE WITHOUT CONTRAST TECHNIQUE: Multidetector CT imaging of the head and cervical spine was performed following the standard protocol without intravenous contrast. Multiplanar CT image reconstructions of the cervical spine were also generated. COMPARISON:  Head CT 10/12/2019, cervical spine CT 11/10/2012. FINDINGS: CT HEAD FINDINGS Brain: There is a new hypodense right subdural collection,  without visible acute blood products.There is adjacent loss of gray-white matter attenuation in the right parietal lobe, concerning for ischemia.There is a right frontal approach ventriculostomy catheter with tip unchanged in position in the midline between the frontal horns. Vascular: No hyperdense vessel or unexpected calcification. Skull: Right frontal burr hole. Prior suboccipital craniotomy. No acute fracture. Sinuses/Orbits: Paranasal sinuses are predominantly clear. Other: None. CT CERVICAL SPINE FINDINGS Alignment: Unchanged Skull base and vertebrae: No acute fracture. Soft tissues and spinal canal: VP shunt catheter courses down the right neck. No prevertebral soft tissue swelling. No visible canal hematoma. Disc levels: Enlargement of the spinal canal throughout the cervical spine. 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 Unchanged tubing within the spinal canal in comparison to prior exam in 2014. Upper chest: Negative. Other: None. IMPRESSION: Head CT: New hypodense right subdural collection measuring 0.9 cm, without evidence of acute blood products. Underlying loss of gray-white matter differentiation in the right parietal lobe, concerning for acute-subacute infarct. Recommend MRI. Cervical spine CT: No acute cervical spine fracture. Unchanged chronic congenital and postoperative changes. Critical Value/emergent results were called by telephone at the time of interpretation on 01/04/2021 at 2:43 pm to provider [facility] , who verbally acknowledged these results. Electronically Signed   By: Caprice Renshaw M.D.   On: 01/04/2021 14:48   CT Cervical Spine Wo Contrast  Result Date: 01/04/2021 CLINICAL DATA:  Neck trauma, dangerous injury mechanism (Age 47-64y); Head trauma, focal neuro findings (Age 64-64y) EXAM: CT HEAD WITHOUT CONTRAST CT CERVICAL SPINE WITHOUT CONTRAST TECHNIQUE: Multidetector CT imaging of the head and cervical spine was performed following the standard  protocol without intravenous contrast. Multiplanar CT image reconstructions of the cervical spine were also generated. COMPARISON:  Head CT 10/12/2019, cervical spine CT 11/10/2012. FINDINGS: CT HEAD FINDINGS Brain: There is a new hypodense right subdural collection, without visible acute blood products.There is adjacent loss of gray-white matter attenuation in the right parietal lobe, concerning for ischemia.There is a right frontal approach ventriculostomy catheter with tip unchanged in position in the midline between the frontal horns. Vascular: No hyperdense vessel or unexpected calcification.  Skull: Right frontal burr hole. Prior suboccipital craniotomy. No acute fracture. Sinuses/Orbits: Paranasal sinuses are predominantly clear. Other: None. CT CERVICAL SPINE FINDINGS Alignment: Unchanged Skull base and vertebrae: No acute fracture. Soft tissues and spinal canal: VP shunt catheter courses down the right neck. No prevertebral soft tissue swelling. No visible canal hematoma. Disc levels: Enlargement of the spinal canal throughout the cervical spine. 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 Unchanged tubing within the spinal canal in comparison to prior exam in 2014. Upper chest: Negative. Other: None. IMPRESSION: Head CT: New hypodense right subdural collection measuring 0.9 cm, without evidence of acute blood products. Underlying loss of gray-white matter differentiation in the right parietal lobe, concerning for acute-subacute infarct. Recommend MRI. Cervical spine CT: No acute cervical spine fracture. Unchanged chronic congenital and postoperative changes. Critical Value/emergent results were called by telephone at the time of interpretation on 01/04/2021 at 2:43 pm to provider [facility] , who verbally acknowledged these results. Electronically Signed   By: Caprice Renshaw M.D.   On: 01/04/2021 14:48   MR ANGIO HEAD WO CONTRAST  Result Date: 01/06/2021 CLINICAL DATA:  Stroke  follow-up EXAM: MRI HEAD WITHOUT AND WITH CONTRAST MRA HEAD WITHOUT CONTRAST MRA NECK WITHOUT AND WITH CONTRAST TECHNIQUE: Multiplanar, multiecho pulse sequences of the brain and surrounding structures were obtained without and with intravenous contrast. Angiographic images of the head were obtained using MRA technique without contrast. Angiographic images of the neck were acquired using MRA technique without and with intravenous contrast. Carotid stenosis measurements (when applicable) are obtained utilizing NASCET criteria, using the distal internal carotid diameter as the denominator. CONTRAST:  5mL GADAVIST GADOBUTROL 1 MMOL/ML IV SOLN COMPARISON:  01/04/2021 and 10/12/2019 CT head, no prior MRI FINDINGS: MRI HEAD FINDINGS Brain: Evaluation is somewhat limited by susceptibility artifact from overlying shunt hardware. 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 Tethering of a portion of the right lateral parietal lobe, extending to the calvarium and the location of a prior right parietal burr hole (series 16, image 96). Possible area of restricted diffusion in the posterior right temporal lobe (series 5, image 25). Additional areas of increased signal on diffusion-weighted imaging are without definite ADC correlate and may represent remote infarcts. Fluid collection measuring up to 6 mm along the right parietal convexity in this area, without evidence of hemorrhage or proteinaceous fluid; the fluid collection follows CSF signal on all sequences. Mild diffuse dural thickening. No definite abnormal enhancement. Right frontal approach ventriculostomy catheter with tip approximating the body of the left lateral ventricle. No hydrocephalus. Status post prior suboccipital craniectomy with unchanged appearance of the cerebellum and tonsils compared to Vascular: See MRA findings, below. Skull and upper  cervical spine: Status post suboccipital craniectomy. Right frontal and parietal burr holes. Otherwise normal marrow signal. Sinuses/Orbits: Negative. Other: The mastoids are well aerated. MRA HEAD FINDINGS No definite flow seen in the right intracranial internal carotid artery, with flow reconstituting in the supraclinoid segment. The right intracranial internal carotid artery is patent to the terminus, without stenosis or other abnormality. A1 segments patent, although there is somewhat poor flow in the right A1. Anterior communicating artery not well visualized. Possible fusiform aneurysmal dilatation of the proximal right A2 (series 1050, image 108), although this may be artifactual. Anterior cerebral arteries are patent to their distal aspects. Normal left MCA, with distal branches well perfused. The right M1 is somewhat irregular; although the  distal branches appear perfused, they are less perfused than the left MCA branches. Right vertebral artery is patent to the vertebrobasilar junction without stenosis. Variant branching at the distal basilar artery, which bifurcates before giving off the PCA and SCA bilaterally. Distal PCA branches appear well perfused. MRA NECK FINDINGS Aortic arch: Standard branching. No evidence of dissection or stenosis. Right carotid system: Severe focal stenosis at the origin of the ICA (series 1107, image 38), with occlusion slightly more distally. The right ICA is not definitively seen for the remainder of extracranial course. Left carotid system: Patent without significant stenosis peer Vertebral arteries: The right vertebral artery is well visualized and appears patent, without hemodynamically significant stenosis. The left vertebral artery is not visualized from just distal to its origin lesion. Other: None IMPRESSION: 1. Tethering of a segment of the right parietal cortex to a right parietal burr hole. Laminar necrosis in the right parietal, posterior temporal, and lateral  occipital cortex, likely secondary to subacute or chronic infarcts, which were not seen on the 10/12/2019 head CT. Small focus of restricted diffusion within the posterior right temporal lobe may reflect a small more acute infarct. 2. Dural thickening, suggestive of intracranial hypotension, possibly due to over shunting. This could also explain the apparent right parietal convexity extra-axial collection, which would accentuate and exacerbate the tethering. 3. Non opacification of the majority of the extra and intracranial right ICA, with reconstitution at the supraclinoid segment, likely via collaterals. Poor flow in the right ACA and MCA, with possible fusiform dilatation of the right A2 segment. Consider CTA for better evaluation. 4. Non opacification of the left vertebral artery. These results were called by telephone at the time of interpretation on 01/06/2021 at 1:40 pm to provider MCNEILL [facility] , who verbally acknowledged these results. Electronically Signed   By: Wiliam Ke M.D.   On: 01/06/2021 13:42   MR ANGIO NECK W WO CONTRAST  Result Date: 01/06/2021 CLINICAL DATA:  Stroke follow-up EXAM: MRI HEAD WITHOUT AND WITH CONTRAST MRA HEAD WITHOUT CONTRAST MRA NECK WITHOUT AND WITH CONTRAST TECHNIQUE: Multiplanar, multiecho pulse sequences of the brain and surrounding structures were obtained without and with intravenous contrast. Angiographic images of the head were obtained using MRA technique without contrast. Angiographic images of the neck were acquired using MRA technique without and with intravenous contrast. Carotid stenosis measurements (when applicable) are obtained utilizing NASCET criteria, using the distal internal carotid diameter as the denominator. CONTRAST:  64mL GADAVIST GADOBUTROL 1 MMOL/ML IV SOLN COMPARISON:  01/04/2021 and 10/12/2019 CT head, no prior MRI FINDINGS: MRI HEAD FINDINGS Brain: Evaluation is somewhat limited by susceptibility artifact from overlying shunt hardware.  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 Tethering of a portion of the right lateral parietal lobe, extending to the calvarium and the location of a prior right parietal burr hole (series 16, image 96). Possible area of restricted diffusion in the posterior right temporal lobe (series 5, image 25). Additional areas of increased signal on diffusion-weighted imaging are without definite ADC correlate and may represent remote infarcts. Fluid collection measuring up to 6 mm along the right parietal convexity in this area, without evidence of hemorrhage or proteinaceous fluid; the fluid collection follows CSF signal on all sequences. Mild diffuse dural thickening. No definite abnormal enhancement. Right frontal approach ventriculostomy catheter with tip approximating the body of the left lateral ventricle. No hydrocephalus. Status post prior suboccipital craniectomy with unchanged appearance  of the cerebellum and tonsils compared to Vascular: See MRA findings, below. Skull and upper cervical spine: Status post suboccipital craniectomy. Right frontal and parietal burr holes. Otherwise normal marrow signal. Sinuses/Orbits: Negative. Other: The mastoids are well aerated. MRA HEAD FINDINGS No definite flow seen in the right intracranial internal carotid artery, with flow reconstituting in the supraclinoid segment. The right intracranial internal carotid artery is patent to the terminus, without stenosis or other abnormality. A1 segments patent, although there is somewhat poor flow in the right A1. Anterior communicating artery not well visualized. Possible fusiform aneurysmal dilatation of the proximal right A2 (series 1050, image 108), although this may be artifactual. Anterior cerebral arteries are patent to their distal aspects. Normal left MCA, with distal branches well perfused. The right M1 is  somewhat irregular; although the distal branches appear perfused, they are less perfused than the left MCA branches. Right vertebral artery is patent to the vertebrobasilar junction without stenosis. Variant branching at the distal basilar artery, which bifurcates before giving off the PCA and SCA bilaterally. Distal PCA branches appear well perfused. MRA NECK FINDINGS Aortic arch: Standard branching. No evidence of dissection or stenosis. Right carotid system: Severe focal stenosis at the origin of the ICA (series 1107, image 38), with occlusion slightly more distally. The right ICA is not definitively seen for the remainder of extracranial course. Left carotid system: Patent without significant stenosis peer Vertebral arteries: The right vertebral artery is well visualized and appears patent, without hemodynamically significant stenosis. The left vertebral artery is not visualized from just distal to its origin lesion. Other: None IMPRESSION: 1. Tethering of a segment of the right parietal cortex to a right parietal burr hole. 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 Small focus of restricted diffusion within the posterior right temporal lobe may reflect a small more acute infarct. 2. Dural thickening, suggestive of intracranial hypotension, possibly due to over shunting. This could also explain the apparent right parietal convexity extra-axial collection, which would accentuate and exacerbate the tethering. 3. Non opacification of the majority of the extra and intracranial right ICA, with reconstitution at the supraclinoid segment, likely via collaterals. Poor flow in the right ACA and MCA, with possible fusiform dilatation of the right A2 segment. Consider CTA for better evaluation. 4. Non opacification of the left vertebral artery. These results were called by  telephone at the time of interpretation on 01/06/2021 at 1:40 pm to provider MCNEILL [facility] , who verbally acknowledged these results. Electronically Signed   By: Wiliam Ke M.D.   On: 01/06/2021 13:42   MR BRAIN W WO CONTRAST  Result Date: 01/06/2021 CLINICAL DATA:  Stroke follow-up EXAM: MRI HEAD WITHOUT AND WITH CONTRAST MRA HEAD WITHOUT CONTRAST MRA NECK WITHOUT AND WITH CONTRAST TECHNIQUE: Multiplanar, multiecho pulse sequences of the brain and surrounding structures were obtained without and with intravenous contrast. Angiographic images of the head were obtained using MRA technique without contrast. Angiographic images of the neck were acquired using MRA technique without and with intravenous contrast. Carotid stenosis measurements (when applicable) are obtained utilizing NASCET criteria, using the distal internal carotid diameter as the denominator. CONTRAST:  5mL GADAVIST GADOBUTROL 1 MMOL/ML IV SOLN COMPARISON:  01/04/2021 and 10/12/2019 CT head, no prior MRI FINDINGS: MRI HEAD FINDINGS Brain: Evaluation is somewhat limited by susceptibility artifact from overlying shunt hardware. Increased curvilinear cortical T1 signal on precontrast imaging in  the right parietal, posterior temporal, and lateral occipital lobes, likely laminar necrosis. Tethering of a portion of the right lateral parietal lobe, extending to the calvarium and the location of a prior right parietal burr hole (series 16, image 96). Possible area of restricted diffusion in the posterior right temporal lobe (series 5, image 25). Additional areas of increased signal on diffusion-weighted imaging are without definite ADC correlate and may represent remote infarcts. Fluid collection measuring up to 6 mm along the right parietal convexity in this area, without evidence of hemorrhage or proteinaceous fluid; the fluid collection follows CSF signal on all sequences. Mild diffuse dural thickening. No definite abnormal enhancement. Right  frontal approach ventriculostomy catheter with tip approximating the body of the left lateral ventricle. No hydrocephalus. Status post prior suboccipital craniectomy with unchanged appearance of the cerebellum and tonsils compared to Vascular: See MRA findings, below. Skull and upper cervical spine: Status post suboccipital craniectomy. Right frontal and parietal burr holes. Otherwise normal marrow signal. Sinuses/Orbits: Negative. Other: The mastoids are well aerated. MRA HEAD FINDINGS No definite flow seen in the right intracranial internal carotid artery, with flow reconstituting in the supraclinoid segment. The right intracranial internal carotid artery is patent to the terminus, without stenosis or other abnormality. A1 segments patent, although there is somewhat poor flow in the right A1. Anterior communicating artery not well visualized. Possible fusiform aneurysmal dilatation of the proximal right A2 (series 1050, image 108), although this may be artifactual. Anterior cerebral arteries are patent to their distal aspects. Normal left MCA, with distal branches well perfused. The right M1 is somewhat irregular; although the distal branches appear perfused, they are less perfused than the left MCA branches. Right vertebral artery is patent to the vertebrobasilar junction without stenosis. Variant branching at the distal basilar artery, which bifurcates before giving off the PCA and SCA bilaterally. Distal PCA branches appear well perfused. MRA NECK FINDINGS Aortic arch: Standard branching. No evidence of dissection or stenosis. Right carotid system: Severe focal stenosis at the origin of the ICA (series 1107, image 38), with occlusion slightly more distally. The right ICA is not definitively seen for the remainder of extracranial course. Left carotid system: Patent without significant stenosis peer Vertebral arteries: The right vertebral artery is well visualized and appears patent, without hemodynamically  significant stenosis. The left vertebral artery is not visualized from just distal to its origin lesion. Other: None IMPRESSION: 1. Tethering of a segment of the right parietal cortex to a right parietal burr hole. 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 Small focus of restricted diffusion within the posterior right temporal lobe may reflect a small more acute infarct. 2. Dural thickening, suggestive of intracranial hypotension, possibly due to over shunting. This could also explain the apparent right parietal convexity extra-axial collection, which would accentuate and exacerbate the tethering. 3. Non opacification of the majority of the extra and intracranial right ICA, with reconstitution at the supraclinoid segment, likely via collaterals. Poor flow in the right ACA and MCA, with possible fusiform dilatation of the right A2 segment. Consider CTA for better evaluation. 4. Non opacification of the left vertebral artery. These results were called by telephone at the time of interpretation on 01/06/2021 at 1:40 pm to provider MCNEILL [facility] , who verbally acknowledged these results. Electronically Signed   By: Wiliam Ke M.D.   On: 01/06/2021 13:42   CT CHEST  ABDOMEN PELVIS W CONTRAST  Result Date: 01/04/2021 CLINICAL DATA:  recurrent falls, VP shunt present EXAM: CT CHEST, ABDOMEN, AND PELVIS WITH CONTRAST TECHNIQUE: Multidetector CT imaging of the chest, abdomen and pelvis was performed following the standard protocol during bolus administration of intravenous contrast. CONTRAST:  75mL OMNIPAQUE IOHEXOL 350 MG/ML SOLN COMPARISON:  None. FINDINGS: CT CHEST FINDINGS Cardiovascular: Normal cardiac size.No pericardial disease.Normal size main and branch pulmonary arteries.The thoracic aorta is unremarkable. Mediastinum/Nodes: No lymphadenopathy.The thyroid is  unremarkable.Esophagus is unremarkable.The trachea is unremarkable. Lungs/Pleura: No focal airspace consolidation.No suspicious pulmonary nodules or masses.No pleural effusion.No pneumothorax. 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 Musculoskeletal: No acute osseous abnormality.No suspicious lytic or blastic lesions. Congenital enlarged spinal canal. CT ABDOMEN PELVIS FINDINGS Hepatobiliary: No hepatic injury or perihepatic hematoma. No gallstones, gallbladder wall thickening, or biliary dilatation. Pancreas: Unremarkable. No pancreatic ductal dilatation or surrounding inflammatory changes. Spleen: Focal peripheral somewhat wedge-shaped hypodensity in the upper posterior aspect of the spleen, morphology favoring small cyst, hemangioma, or possibly infarct (series 2, image 47) rather than laceration. Adrenals/Urinary Tract: No adrenal hemorrhage or renal injury identified. Bladder is unremarkable. No hydronephrosis or nephrolithiasis. No renal laceration. Subcentimeter hypodense right renal lesion, likely cyst. The bladder is minimally distended but unremarkable. Stomach/Bowel: The stomach is within normal limits. There is no evidence of bowel obstruction. No evidence of appendicitis. Vascular/Lymphatic: Diffuse atherosclerotic calcified and noncalcified plaque throughout the abdominal aorta, which appears diminutive distally. Reproductive: Unremarkable. Other: No abdominal wall hernia or abnormality. No abdominopelvic ascites. VP shunt tubing terminates in the pelvis. Musculoskeletal: No acute osseous abnormality. Mild bilateral hip osteoarthritis. Diffuse muscle atrophy. Congenital enlargement of the spinal canal. IMPRESSION: No evidence of acute trauma in the chest. Focal peripheral hypodensity in the upper posterior aspect of the spleen, morphology favoring small cyst, hemangioma, or possibly splenic infarct, and felt unlikely to be acute trauma. No other acute findings in the abdomen or pelvis.  Electronically Signed   By: Caprice Renshaw M.D.   On: 01/04/2021 14:56   CT T-SPINE NO CHARGE  Result Date: 01/04/2021 CLINICAL DATA:  Fall EXAM: CT THORACIC SPINE WITHOUT CONTRAST TECHNIQUE: Multidetector CT images of the thoracic were obtained using the standard protocol without intravenous contrast. COMPARISON:  None. FINDINGS: Alignment: Normal. Vertebrae: No acute fracture or focal pathologic process. Paraspinal and other soft tissues: Reported separately on chest CT. Disc levels: Preserved disc heights. Chronic enlargement of the spinal canal. IMPRESSION: No acute thoracic spine fracture. Electronically Signed   By: Caprice Renshaw M.D.   On: 01/04/2021 14:18   CT L-SPINE NO CHARGE  Result Date: 01/04/2021 CLINICAL DATA:  Fall EXAM: CT LUMBAR SPINE WITHOUT CONTRAST TECHNIQUE: Multidetector CT imaging of the lumbar spine was performed without intravenous contrast administration. Multiplanar CT image reconstructions were also generated. COMPARISON:  None. FINDINGS: Segmentation: 5 lumbar type vertebrae. Alignment: Normal Vertebrae: No acute fracture or focal pathologic process. Paraspinal and other soft tissues: Reported separately on CT abdomen and pelvis Disc levels: No visible impingement. IMPRESSION: No acute lumbar spine fracture. Electronically Signed   By: Caprice Renshaw M.D.   On: 01/04/2021 14:13   DG Chest Portable 1 View  Result Date: 01/04/2021 CLINICAL DATA:  Weakness, falls EXAM: PORTABLE CHEST 1 VIEW COMPARISON:  Chest radiograph 02/11/2013 FINDINGS: The Carolyn Herring tubing is seen over the right hemithorax. A focus of calcification along the right aspect of the shunt projecting over the right apex is unchanged. The cardiomediastinal silhouette is normal. There is no focal consolidation or pulmonary  edema. There is no pleural effusion or pneumothorax. Slight levocurvature of the upper thoracic spine is noted. There is no acute osseous abnormality. IMPRESSION: No radiographic evidence of acute  cardiopulmonary process. Electronically Signed   By: Lesia Hausen M.D.   On: 01/04/2021 12:51   ECHOCARDIOGRAM COMPLETE  Result Date: 01/05/2021    ECHOCARDIOGRAM REPORT   Carolyn Herring Name:   Carolyn Herring Date of Exam: 01/05/2021 Medical Rec #:  [id number]        Height:       60.0 in Accession #:    [id number]       Weight:       106.9 lb Date of Birth:  1975-04-04       BSA:          1.430 m Carolyn Herring Age:    44 years         BP:           157/111 mmHg Carolyn Herring Gender: F                HR:           97 bpm. Exam Location:  [facility] Procedure: 2D Echo, Color Doppler and Cardiac Doppler Indications:     I63.9 Stroke  History:         Carolyn Herring has no prior history of Echocardiogram examinations.                  Risk Factors:Hypertension.  Sonographer:     Humphrey Rolls Referring Phys:  [phone] MCNEILL P KIRKPATRICK Diagnosing Phys: Yvonne Kendall MD  Sonographer Comments: Suboptimal subcostal window. IMPRESSIONS  1. Left ventricular ejection fraction, by estimation, is 55 to 60%. The left ventricle has normal function. The left ventricle has no regional wall motion abnormalities. Left ventricular diastolic parameters are consistent with Grade I diastolic dysfunction (impaired relaxation). Elevated left atrial pressure.  2. Right ventricular systolic function is normal. The right ventricular size is normal.  3. The mitral valve is abnormal. Cannot exclude prolapse of posterior leaflet or cleft. There is at least mild to moderate mitral valve regurgitation. No evidence of mitral stenosis.  4. The aortic valve is tricuspid. Aortic valve regurgitation is not visualized. No aortic stenosis is present.  5. The inferior vena cava is normal in size with <50% respiratory variability, suggesting right atrial pressure of 8 mmHg. FINDINGS  Left Ventricle: Left ventricular ejection fraction, by estimation, is 55 to 60%. The left ventricle has normal function. The left ventricle has no regional wall motion abnormalities. The left  ventricular internal cavity size was normal in size. There is  no left ventricular hypertrophy. Left ventricular diastolic parameters are consistent with Grade I diastolic dysfunction (impaired relaxation). Elevated left atrial pressure. Right Ventricle: The right ventricular size is normal. No increase in right ventricular wall thickness. Right ventricular systolic function is normal. Left Atrium: Left atrial size was normal in size. Right Atrium: Right atrial size was normal in size. Pericardium: There is no evidence of pericardial effusion. Presence of pericardial fat pad. Mitral Valve: The mitral valve is abnormal. Mild to moderate mitral valve regurgitation. No evidence of mitral valve stenosis. MV peak gradient, 6.0 mmHg. The mean mitral valve gradient is 3.0 mmHg. Tricuspid Valve: The tricuspid valve is not well visualized. Tricuspid valve regurgitation is trivial. Aortic Valve: The aortic valve is tricuspid. Aortic valve regurgitation is not visualized. No aortic stenosis is present. Aortic valve mean gradient measures 6.0 mmHg. Aortic valve peak gradient measures 11.3 mmHg.  Aortic valve area, by VTI measures 2.04  cm. Pulmonic Valve: The pulmonic valve was not well visualized. Pulmonic valve regurgitation is trivial. No evidence of pulmonic stenosis. Aorta: The aortic root and ascending aorta are structurally normal, with no evidence of dilitation. Pulmonary Artery: The pulmonary artery is not well seen. Venous: The inferior vena cava is normal in size with less than 50% respiratory variability, suggesting right atrial pressure of 8 mmHg. IAS/Shunts: The interatrial septum was not well visualized.  LEFT VENTRICLE PLAX 2D LVIDd:         4.40 cm   Diastology LVIDs:         3.16 cm   LV e' medial:    6.31 cm/s LV PW:         0.85 cm   LV E/e' medial:  16.5 LV IVS:        0.66 cm   LV e' lateral:   7.83 cm/s LVOT diam:     1.80 cm   LV E/e' lateral: 13.3 LV SV:         63 LV SV Index:   44 LVOT Area:     2.54  cm  RIGHT VENTRICLE RV Basal diam:  2.72 cm RV S prime:     15.60 cm/s LEFT ATRIUM             Index        RIGHT ATRIUM          Index LA diam:        2.80 cm 1.96 cm/m   RA Area:     8.05 cm LA Vol (A2C):   26.3 ml 18.39 ml/m  RA Volume:   16.00 ml 11.19 ml/m LA Vol (A4C):   32.9 ml 23.00 ml/m LA Biplane Vol: 29.9 ml 20.90 ml/m  AORTIC VALVE                     PULMONIC VALVE AV Area (Vmax):    1.95 cm      PV Vmax:       1.16 m/s AV Area (Vmean):   1.94 cm      PV Vmean:      74.500 cm/s AV Area (VTI):     2.04 cm      PV VTI:        0.167 m AV Vmax:           168.00 cm/s   PV Peak grad:  5.4 mmHg AV Vmean:          119.000 cm/s  PV Mean grad:  2.0 mmHg AV VTI:            0.309 m AV Peak Grad:      11.3 mmHg AV Mean Grad:      6.0 mmHg LVOT Vmax:         129.00 cm/s LVOT Vmean:        90.500 cm/s LVOT VTI:          0.248 m LVOT/AV VTI ratio: 0.80  AORTA Ao Root diam: 2.60 cm MITRAL VALVE MV Area (PHT): 6.96 cm     SHUNTS MV Area VTI:   2.99 cm     Systemic VTI:  0.25 m MV Peak grad:  6.0 mmHg     Systemic Diam: 1.80 cm MV Mean grad:  3.0 mmHg MV Vmax:       1.22 m/s MV Vmean:      79.0 cm/s MV Decel Time: 109 msec  MV E velocity: 104.00 cm/s MV A velocity: 124.00 cm/s MV E/A ratio:  0.84 Cristal Deer End MD Electronically signed by Yvonne Kendall MD Signature Date/Time: 01/05/2021/10:54:03 AM    Final    DG FL GUIDED LUMBAR PUNCTURE  Result Date: 01/07/2021 CLINICAL DATA:  Carolyn Herring with sepsis and a VP shunt request received for lumbar puncture. EXAM: DIAGNOSTIC LUMBAR PUNCTURE UNDER FLUOROSCOPIC GUIDANCE COMPARISON:  MR brain imaging performed 01/06/2021 report reviewed prior to the procedure. FLUOROSCOPY TIME:  Fluoroscopy Time:  0.2 minute Radiation Exposure Index (if provided by the fluoroscopic device): 3.0 mGy Number of Acquired Spot Images: 2 PROCEDURE: Informed consent was obtained from the Carolyn Herring prior to the procedure, including potential complications of infection, bleeding, CSF leak  requiring additional procedures, paresthesia, headache, allergy, and pain. With the Carolyn Herring prone, the lower back was prepped with Betadine. 1% Lidocaine was used for local anesthesia. Lumbar puncture was performed at the L3-L4 level using a 20 gauge needle with return of colorless CSF with an opening pressure of 20 cm water. Eight ml of CSF were obtained for laboratory studies. The Carolyn Herring tolerated the procedure well and there were no apparent complications. A sterile bandage was applied. IMPRESSION: Technically successful fluoroscopic guided lumbar puncture; opening pressure of 20 cm water. Procedure performed by Pattricia Boss PA-C, supervised by Lesia Hausen. Electronically Signed   By: Lesia Hausen M.D.   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

## 2021-01-07 NOTE — Procedures (Signed)
PROCEDURE SUMMARY:  Successful fluoroscopic guided lumbar puncture.  Yielded 8 mL of colorless CSF fluid.  No immediate complications.  Pt tolerated well.   Specimen was sent for labs.  EBL < 1 ml   Cloretta Ned 01/07/2021 2:32 PM

## 2021-01-07 NOTE — Progress Notes (Signed)
GI Inpatient Follow-up Note  Subjective:  Patient seen and doing well. Some diarrhea but infectious studies negative  Scheduled Inpatient Medications:   budesonide (PULMICORT) nebulizer solution  0.5 mg Nebulization BID   Chlorhexidine Gluconate Cloth  6 each Topical Q0600   methylPREDNISolone (SOLU-MEDROL) injection  20 mg Intravenous Q12H   [START ON 01/08/2021] pantoprazole  40 mg Intravenous Q12H    Continuous Inpatient Infusions:    ampicillin-sulbactam (UNASYN) IV 3 g (01/07/21 1010)   anidulafungin Stopped (01/06/21 1603)   pantoprazole 8 mg/hr (01/07/21 0457)    PRN Inpatient Medications:  acetaminophen, hydrALAZINE, ipratropium-albuterol, loperamide, ondansetron (ZOFRAN) IV  Review of Systems:  Review of Systems  Constitutional:  Negative for chills and fever.  HENT:  Negative for nosebleeds.   Respiratory:  Negative for shortness of breath.   Cardiovascular:  Negative for chest pain.  Gastrointestinal:  Positive for diarrhea. Negative for blood in stool, constipation and melena.  Musculoskeletal:  Positive for joint pain.  Skin:  Negative for rash.  Neurological:  Negative for focal weakness.  Psychiatric/Behavioral:  Negative for substance abuse.   All other systems reviewed and are negative.    Physical Examination: BP (!) 152/104 (BP Location: Left Leg)   Pulse 81   Temp 98.2 F (36.8 C) (Oral)   Resp 20   Ht 5' (1.524 m)   Wt 50.9 kg   SpO2 98%   BMI 21.92 kg/m  Gen: NAD, alert and oriented x 4 HEENT: PEERLA, EOMI, Neck: supple, no JVD or thyromegaly Chest: No respiratory distress CV: RRR Abd: patient deferred due to tenderness Ext: no edema Skin: no rash or lesions noted Lymph: no LAD  Data: Lab Results  Component Value Date   WBC 11.3 (H) 01/07/2021   HGB 9.4 (L) 01/07/2021   HCT 29.4 (L) 01/07/2021   MCV 80.5 01/07/2021   PLT 226 01/07/2021   Recent Labs  Lab 01/05/21 0540 01/06/21 0415 01/07/21 0455  HGB 11.8* 11.9* 9.4*     Lab Results  Component Value Date   NA 136 01/07/2021   K 4.2 01/07/2021   CL 104 01/07/2021   CO2 24 01/07/2021   BUN 15 01/07/2021   CREATININE 0.62 01/07/2021   Lab Results  Component Value Date   ALT 13 01/04/2021   AST 20 01/04/2021   ALKPHOS 82 01/04/2021   BILITOT 0.6 01/04/2021   Recent Labs  Lab 01/04/21 1915 01/05/21 0540  APTT 27  --   INR 1.1 1.1    Assessment/Plan: Ms. Milburn is a 45 y.o. lady with spina bifada and found to have giant gastric ulcer that was not actively bleeding and also found to have candida. Drop in hemoglobin but had drop in all cell lines and is hemodynamically stable. Will continue to monitor for now. 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  Recommendations:  - continue IV PPI either gtt or BID - can advance to soft diet. Recommend nutrition consult. Could benefit from protein supplementation. - has trouble with pills so at discharge will need PPI in capsule form so she can break apart and take twice daily - esophagus with localized esophagitis at GE junction so no absolute contraindication to TEE - no blood thinners unless absolutely necessary - maintain active type and screen and two large bore IV's - transfuse if hemoglobin < 7 - monitor for hemodynamically si  [facility] will continue to follow, please call with any questions or concerns.  Merlyn Lot MD,  [facility]

## 2021-01-07 NOTE — Progress Notes (Signed)
Physical Therapy Treatment Patient Details Name: Carolyn Herring MRN: [id number] DOB: 04-29-1975 Today's Date: 01/07/2021   History of Present Illness Pt is a 45 y.o. F arriving to ED after a fall with decreased responsiveness and hx of abdominal pain and diarrhea. Upon arrival pt was found to have severe GI bleed. 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    PT Comments    Pt fatigued in bed, stating recently just clean up and denying any OOB mobility. Overall agreeable to exercises in bed. Pt oriented x 3, disoriented to time, overall able to follow one step-commands, but keeps eyes shut throughout session. Performed light resistive exercises in bed to prevent muscle atrophy and maintain ROM. Pt noted pain in abdomen, chest, and L LE during session but unable to provide a number instead saying "severe." Pain alleviated w/ rest. Repositioned in bed w/ MOD I for rolling and MIN-A for scooting to [facility]. SNF remains primary recommendation. Will update d/c recs pending further treatment and pt participation. Skilled PT intervention is indicated to address deficits in function, mobility, and to return to PLOF as able.    Recommendations for follow up therapy are one component of a multi-disciplinary discharge planning process, led by the attending physician.  Recommendations may be updated based on patient status, additional functional criteria and insurance authorization.  Follow Up Recommendations  SNF;Supervision for mobility/OOB     Equipment Recommendations  Other (comment) (TBD next venue of care)    Recommendations for Other Services       Precautions / Restrictions Precautions Precautions: Fall Restrictions Weight Bearing Restrictions: No     Mobility  Bed Mobility Overal bed mobility: Needs Assistance Bed Mobility: Rolling Rolling: Min assist         General  bed mobility comments: Pt denied OOB mobility, agreeable to rolling & scooting to HOB; MOD I for rolling w/ reaching to bed rails; MIN-A for scooting to EOB, pt able to press down with LEs, unable to utilize UE for boosting    Transfers                 General transfer comment: Pt refusal  Ambulation/Gait             General Gait Details: Pt refusal   Stairs             Wheelchair Mobility    Modified Rankin (Stroke Patients Only)       Balance       Sitting balance - Comments: Pt refusal for OOB                                    Cognition Arousal/Alertness:  (Fatigued) Behavior During Therapy: WFL for tasks assessed/performed Overall Cognitive Status: No family/caregiver present to determine baseline cognitive functioning                                 General Comments: Oriented to name, situation, place; disoriented to year; follows 1 step commands consistently      Exercises General Exercises - Lower Extremity Ankle Circles/Pumps: AROM;10 reps;Both;Supine Short Arc Quad: AROM;Both;10 reps;Supine Heel Slides: AROM;Both;10 reps;Supine;Strengthening Hip ABduction/ADduction: AROM;Strengthening;Both;10 reps;Supine Straight Leg Raises: AROM;Both;5 reps;Strengthening Other Exercises Other Exercises: Pt education: importance of OOB mobility, WB on  LE, and improving CV health    General Comments        Pertinent Vitals/Pain Pain Assessment: Faces Faces Pain Scale: Hurts even more Pain Location: Abdomen, chest, L ant thigh Pain Descriptors / Indicators: Discomfort;Aching;Sore Pain Intervention(s): Limited activity within patient's tolerance;Monitored during session;Repositioned    Home Living                      Prior Function            PT Goals (current goals can now be found in the care plan section) Progress towards PT goals: Progressing toward goals    Frequency    Min 2X/week      PT  Plan Current plan remains appropriate    Co-evaluation              AM-PAC PT "6 Clicks" Mobility   Outcome Measure  Help needed turning from your back to your side while in a flat bed without using bedrails?: None Help needed moving from lying on your back to sitting on the side of a flat bed without using bedrails?: A Little Help needed moving to and from a bed to a chair (including a wheelchair)?: A Little Help needed standing up from a chair using your arms (e.g., wheelchair or bedside chair)?: A Little   Help needed climbing 3-5 steps with a railing? : A Lot 6 Click Score: 15    End of Session   Activity Tolerance: Patient limited by fatigue Patient left: in bed;with call bell/phone within reach;with bed alarm set   PT Visit Diagnosis: Other abnormalities of gait and mobility (R26.89);Repeated falls (R29.6);History of falling (Z91.81);Muscle weakness (generalized) (M62.81)     Time: 4287-6811 PT Time Calculation (min) (ACUTE ONLY): 23 min  Charges:             [employer], SPT

## 2021-01-08 ENCOUNTER — Encounter
Admission: EM | Disposition: A | Discharge status: Skilled Nursing Facility | Payer: Self-pay | Source: Home / Self Care | Attending: Internal Medicine

## 2021-01-08 ENCOUNTER — Inpatient Hospital Stay (HOSPITAL_COMMUNITY)
Admit: 2021-01-08 | Discharge: 2021-01-08 | Disposition: A | Discharge status: Home / Self Care | Payer: Medicaid Other | Attending: Medical | Admitting: Medical

## 2021-01-08 ENCOUNTER — Encounter: Payer: Self-pay | Admitting: Cardiovascular Disease

## 2021-01-08 ENCOUNTER — Inpatient Hospital Stay: Payer: Medicaid Other | Admitting: Certified Registered Nurse Anesthetist

## 2021-01-08 DIAGNOSIS — I371 Nonrheumatic pulmonary valve insufficiency: Secondary | ICD-10-CM

## 2021-01-08 DIAGNOSIS — I340 Nonrheumatic mitral (valve) insufficiency: Secondary | ICD-10-CM

## 2021-01-08 DIAGNOSIS — B377 Candidal sepsis: Secondary | ICD-10-CM | POA: Diagnosis not present

## 2021-01-08 DIAGNOSIS — I361 Nonrheumatic tricuspid (valve) insufficiency: Secondary | ICD-10-CM

## 2021-01-08 HISTORY — PX: TEE WITHOUT CARDIOVERSION: SHX5443

## 2021-01-08 LAB — CBC
HCT: 30.6 % — ABNORMAL LOW (ref 36.0–46.0)
Hemoglobin: 9.7 g/dL — ABNORMAL LOW (ref 12.0–15.0)
MCH: 26.6 pg (ref 26.0–34.0)
MCHC: 31.7 g/dL (ref 30.0–36.0)
MCV: 83.8 fL (ref 80.0–100.0)
Platelets: 253 10*3/uL (ref 150–400)
RBC: 3.65 MIL/uL — ABNORMAL LOW (ref 3.87–5.11)
RDW: 24.4 % — ABNORMAL HIGH (ref 11.5–15.5)
WBC: 21.4 10*3/uL — ABNORMAL HIGH (ref 4.0–10.5)
nRBC: 0.2 % (ref 0.0–0.2)

## 2021-01-08 LAB — BASIC METABOLIC PANEL
Anion gap: 4 — ABNORMAL LOW (ref 5–15)
BUN: 13 mg/dL (ref 6–20)
CO2: 25 mmol/L (ref 22–32)
Calcium: 8.3 mg/dL — ABNORMAL LOW (ref 8.9–10.3)
Chloride: 107 mmol/L (ref 98–111)
Creatinine, Ser: 0.56 mg/dL (ref 0.44–1.00)
GFR, Estimated: >60 mL/min (ref >60)
Glucose, Bld: 115 mg/dL — ABNORMAL HIGH (ref 70–99)
Potassium: 3.9 mmol/L (ref 3.5–5.1)
Sodium: 136 mmol/L (ref 135–145)

## 2021-01-08 LAB — PATHOLOGIST SMEAR REVIEW

## 2021-01-08 LAB — MAGNESIUM: Magnesium: 2 mg/dL (ref 1.7–2.4)

## 2021-01-08 SURGERY — ECHOCARDIOGRAM, TRANSESOPHAGEAL
Anesthesia: General

## 2021-01-08 MED ORDER — PREDNISONE 20 MG PO TABS: 40.0000 mg | ORAL_TABLET | Freq: Every day | ORAL | Status: DC

## 2021-01-08 MED ORDER — MORPHINE SULFATE (PF) 2 MG/ML IV SOLN
1.0000 mg | Freq: Once | INTRAVENOUS | Status: AC
  Administered 2021-01-08: 06:00:00 1 mg via INTRAVENOUS
  Filled 2021-01-08: qty 1

## 2021-01-08 MED ORDER — SODIUM CHLORIDE 0.9 % IV BOLUS
500.0000 mL | Freq: Once | INTRAVENOUS | Status: AC
  Administered 2021-01-08: 18:00:00 500 mL via INTRAVENOUS

## 2021-01-08 MED ORDER — LIDOCAINE VISCOUS HCL 2 % MT SOLN
OROMUCOSAL | Status: AC
  Administered 2021-01-08: 15 mL via ORAL
  Filled 2021-01-08: qty 15

## 2021-01-08 MED ORDER — SODIUM CHLORIDE FLUSH 0.9 % IV SOLN
INTRAVENOUS | Status: AC
  Filled 2021-01-08: qty 10

## 2021-01-08 MED ORDER — SODIUM CHLORIDE 0.9 % IV SOLN: INTRAVENOUS | Status: DC

## 2021-01-08 MED ORDER — BUTAMBEN-TETRACAINE-BENZOCAINE 2-2-14 % EX AERO
INHALATION_SPRAY | CUTANEOUS | Status: AC
  Administered 2021-01-08: 08:00:00 2 via ORAL
  Filled 2021-01-08: qty 5

## 2021-01-08 MED ORDER — PROPOFOL 10 MG/ML IV BOLUS
INTRAVENOUS | Status: DC | PRN
  Administered 2021-01-08: 30 mg via INTRAVENOUS
  Administered 2021-01-08 (×4): 10 mg via INTRAVENOUS
  Administered 2021-01-08 (×2): 20 mg via INTRAVENOUS

## 2021-01-08 NOTE — TOC Progression Note (Signed)
Transition of Care [facility]) - Progression Note    Patient Details  Name: Carolyn Herring MRN: [id number] Date of Birth: [birth year]  Transition of Care [facility]) CM/SW Contact  Shelbie Hutching, RN Phone Number: 01/08/2021, 2:51 PM  Clinical Narrative:    RNCM met with patient at the bedside today.  Patient reports she is glad that TOC stopped by as she wanted to talk about going to rehab because she is too weak to go home.  [name] offered a bed and patient accepts bed offer, she is aware that it is in [name].  Patient is not medically ready for discharge yet but [name] over at [facility] will start insurance auth as sometimes it takes days to come back.   Patient would like RNCM to reach out to her brother to discuss this with him too.  RNCM has called and left a voice message for brother, [name] to return call.    Expected Discharge Plan: [place] Barriers to Discharge: Continued Medical Work up  Expected Discharge Plan and Services Expected Discharge Plan: [place] In-house Referral: Clinical Social Work   Post Acute Care Choice: [place] Living arrangements for the past 2 months: Single Family Home                                       Social Determinants of Health (SDOH) Interventions    Readmission Risk Interventions No flowsheet data found.

## 2021-01-08 NOTE — Transfer of Care (Signed)
Immediate Anesthesia Transfer of Care Note  Patient: Carolyn Herring  Procedure(s) Performed: TRANSESOPHAGEAL ECHOCARDIOGRAM (TEE)  Patient Location: PACU  Anesthesia Type:General  Level of Consciousness: awake and alert   Airway & Oxygen Therapy: Patient Spontanous Breathing and Patient connected to nasal cannula oxygen  Post-op Assessment: Report given to RN and Post -op Vital signs reviewed and stable  Post vital signs: Reviewed and stable  Last Vitals:  Vitals Value Taken Time  BP 133/80 01/08/21 0833  Temp    Pulse 80 01/08/21 0838  Resp 21 01/08/21 0838  SpO2 99 % 01/08/21 0838  Vitals shown include unvalidated device data.  Last Pain:  Vitals:   01/08/21 0703  TempSrc: Oral  PainSc: 0-No pain         Complications: No notable events documented.

## 2021-01-08 NOTE — Progress Notes (Signed)
GI Inpatient Follow-up Note  Subjective:  Patient seen in follow-up for large antral ulcer with adherent clot. No acute events overnight. Hemoglobin has remained stable. She denies any overt melena or gross hematochezia. She is tolerating soft diet. She denies any fever, nausea, vomiting, heartburn, or abdominal pain. She had TEE today. Infectious disease seeing her today for Candidemia. No other complaints or concerns.   Scheduled Inpatient Medications:   budesonide (PULMICORT) nebulizer solution  0.5 mg Nebulization BID   Chlorhexidine Gluconate Cloth  6 each Topical Q0600   methylPREDNISolone (SOLU-MEDROL) injection  20 mg Intravenous Q12H   pantoprazole  40 mg Intravenous Q12H   sodium chloride flush        Continuous Inpatient Infusions:    anidulafungin 100 mg (01/07/21 1515)    PRN Inpatient Medications:  acetaminophen, hydrALAZINE, ipratropium-albuterol, loperamide, ondansetron (ZOFRAN) IV, traMADol  Review of Systems: Constitutional: Weight is stable.  Eyes: No changes in vision. ENT: No oral lesions, sore throat.  GI: see HPI.  Heme/Lymph: No easy bruising.  CV: No chest pain.  GU: No hematuria.  Integumentary: No rashes.  Neuro: No headaches.  Psych: No depression/anxiety.  Endocrine: No heat/cold intolerance.  Allergic/Immunologic: No urticaria.  Resp: No cough, SOB.  Musculoskeletal: No joint swelling.    Physical Examination: BP (!) 169/108 (BP Location: Right Arm)   Pulse 93   Temp 98.4 F (36.9 C) (Oral)   Resp 16   Ht 5' (1.524 m)   Wt 50.9 kg   SpO2 96%   BMI 21.92 kg/m  Gen: NAD, alert and oriented x 4 HEENT: PEERLA, EOMI, Neck: supple, no JVD or thyromegaly Chest: CTA bilaterally, no wheezes, crackles, or other adventitious sounds CV: RRR, no m/g/c/r Abd: soft, NT, ND, +BS in all four quadrants; no HSM, guarding, ridigity, or rebound tenderness Ext: no edema, well perfused with 2+ pulses, Skin: no rash or lesions noted Lymph: no  LAD  Data: Lab Results  Component Value Date   WBC 21.4 (H) 01/08/2021   HGB 9.7 (L) 01/08/2021   HCT 30.6 (L) 01/08/2021   MCV 83.8 01/08/2021   PLT 253 01/08/2021   Recent Labs  Lab 01/06/21 0415 01/07/21 0455 01/08/21 0514  HGB 11.9* 9.4* 9.7*   Lab Results  Component Value Date   NA 136 01/08/2021   K 3.9 01/08/2021   CL 107 01/08/2021   CO2 25 01/08/2021   BUN 13 01/08/2021   CREATININE 0.56 01/08/2021   Lab Results  Component Value Date   ALT 13 01/04/2021   AST 20 01/04/2021   ALKPHOS 82 01/04/2021   BILITOT 0.6 01/04/2021   Recent Labs  Lab 01/04/21 1915 01/05/21 0540  APTT 27  --   INR 1.1 1.1   EGD 01/05/2021 (Dr. Mia Creek) - LA Grade C reflux esophagitis with no bleeding. - Non-bleeding gastric ulcer with an adherent clot (Forrest Class IIb). - Normal examined duodenum. - No specimens collected.   Assessment/Plan:  45 y/o Caucasian female with a PMH of HTN, hydrocephalus, chronic daily NSAID use, and spina bifida s/p VP shunt with cervical tethered cord release in 2011 last revised in 2019 who presented to the ED 10/10 via EMS for chief complaint of recurrent falls, generalized weakness, and back pain. Hemoglobin 2.7 on admission with guaiac positive stool with 2 units pRBCs and 1 unit FFP with improvement to 11.8. She was found to have sepsis 2/2 Candidemia. She is s/p EGD 10/11 with Dr. Mia Creek showing LA Grade C reflux esophagitis with no  bleeding and large antral ulcer (Forrest Class IIb) with adherent clot. H&H has remained stable. Continue to monitor for signs and symptoms of gastrointestinal bleeding. 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 Infectious disease following for Candidemia and she is on Eraxis. TEE today negative for endocarditis.   Recommendations:  - Continue IV PPI either gtt or BID - Continue to monitor serial H&H. Transfuse for Hgb <7.0.  - Maintain active type and screen and two large bore Ivs - Avoid NSAIDs and any  antiplatelet therapy - Continue serial abdominal examinations - Advance diet as tolerated - No repeat endoluminal evaluation indicated at this time - She will need repeat EGD in 12 weeks to assess healing of antral ulcer - Dr. Norma Fredrickson will be following over the weekend. If there are concerns for bleeding or precipitous drop in hemoglobin, she might need repeat EGD.    Please call with questions or concerns.    Jacob Moores, PA-C [facility] [phone] (Cell)

## 2021-01-08 NOTE — Progress Notes (Signed)
*  PRELIMINARY RESULTS* Echocardiogram Echocardiogram Transesophageal has been performed.  Cristela Blue 01/08/2021, 8:51 AM

## 2021-01-08 NOTE — Progress Notes (Signed)
Transesophageal Echocardiogram :  Indication: Endocarditis, candidiasis, mitral valve disorder, stroke Requesting/ordering  physician: Fran Lowes  Procedure: Benzocaine spray x2 and 2 mls x 2 of viscous lidocaine were given orally to provide local anesthesia to the oropharynx. The patient was positioned supine on the left side, bite block provided. The patient was moderately sedated with the doses of versed and fentanyl as detailed below.  Using digital technique an omniplane probe was advanced into the distal esophagus without incident.   Moderate sedation: 1. Sedation used: Propofol per anesthesia   See report in EPIC  for complete details: In brief, transgastric imaging revealed  No valve endocarditis normal LV function with no RWMAs and no mural apical thrombus.  .  Estimated ejection fraction was 55%.  Right sided cardiac chambers were normal with no evidence of pulmonary hypertension.  Imaging of the septum showed no ASD or VSD Bubble study was negative for shunt 2D and color flow confirmed no PFO  The LA was well visualized in orthogonal views.  There was no spontaneous contrast and no thrombus in the LA and LA appendage   The descending thoracic aorta had no  mural aortic debris with no evidence of aneurysmal dilation or disection   Julien Nordmann 01/08/2021 8:55 AM

## 2021-01-08 NOTE — Progress Notes (Signed)
Completed at [phone]

## 2021-01-08 NOTE — Progress Notes (Signed)
SLP Cancellation Note  Patient Details Name: Carolyn Herring MRN: [id number] DOB: [birth year]   Cancelled treatment:       Reason Eval/Treat Not Completed: SLP screened, no needs identified, will sign off (chart reviewed; consulted pt and staff). Pt denied any difficulty swallowing and is currently on a regular diet; tolerates swallowing pills w/ water per NSG. Pt sipped water via straw while in room. Pt conversed in general conversation w/out overt expressive/receptive deficits noted; pt denied any speech-language deficits. Speech clear. She requested SLP help to call a family member then spoke on the phone briefly. She was min tired from her procedure this AM.  No further skilled ST services indicated as pt appears at her baseline. Recommend f/u at her next venue of care if any needs identified. Pt agreed. NSG to reconsult if any change in status while admitted.        Jerilynn Som, MS, CCC-SLP Speech Language Pathologist Rehab Services [phone] [facility] 01/08/2021, 11:43 AM

## 2021-01-08 NOTE — Progress Notes (Signed)
[facility] CLINIC INFECTIOUS DISEASE PROGRESS NOTE Date of Admission:  01/04/2021     ID: Carolyn Herring is a 45 y.o. female with  Candidemia Active Problems:   Hemorrhagic shock (HCC)   Fall   Altered mental status   History of brain shunt   Pressure injury of skin   Subjective: No fevers, remains alert. LP done yest. TEE today  WBC is up to 24 but on steroids ROS  Eleven systems are reviewed and negative except per hpi  Medications:  Antibiotics Given (last 72 hours)     Date/Time Action Medication Dose Rate   01/05/21 1501 New Bag/Given   Ampicillin-Sulbactam (UNASYN) 3 g in sodium chloride 0.9 % 100 mL IVPB 3 g 200 mL/hr   01/05/21 2141 New Bag/Given   Ampicillin-Sulbactam (UNASYN) 3 g in sodium chloride 0.9 % 100 mL IVPB 3 g 200 mL/hr   01/06/21 0309 New Bag/Given   Ampicillin-Sulbactam (UNASYN) 3 g in sodium chloride 0.9 % 100 mL IVPB 3 g 200 mL/hr   01/06/21 1046 New Bag/Given   Ampicillin-Sulbactam (UNASYN) 3 g in sodium chloride 0.9 % 100 mL IVPB 3 g 200 mL/hr   01/06/21 1752 New Bag/Given   Ampicillin-Sulbactam (UNASYN) 3 g in sodium chloride 0.9 % 100 mL IVPB 3 g 200 mL/hr   01/06/21 2213 New Bag/Given   Ampicillin-Sulbactam (UNASYN) 3 g in sodium chloride 0.9 % 100 mL IVPB 3 g 200 mL/hr   01/07/21 0456 New Bag/Given   Ampicillin-Sulbactam (UNASYN) 3 g in sodium chloride 0.9 % 100 mL IVPB 3 g 200 mL/hr   01/07/21 1010 New Bag/Given   Ampicillin-Sulbactam (UNASYN) 3 g in sodium chloride 0.9 % 100 mL IVPB 3 g 200 mL/hr       budesonide (PULMICORT) nebulizer solution  0.5 mg Nebulization BID   Chlorhexidine Gluconate Cloth  6 each Topical Q0600   methylPREDNISolone (SOLU-MEDROL) injection  20 mg Intravenous Q12H   pantoprazole  40 mg Intravenous Q12H   sodium chloride flush        Objective: Vital signs in last 24 hours: Temp:  [97.7 F (36.5 C)-98.9 F (37.2 C)] 98.4 F (36.9 C) (10/14 0946) Pulse Rate:  [70-96] 93 (10/14 0946) Resp:  [13-24] 16 (10/14  0946) BP: (120-169)/(80-111) 169/108 (10/14 0946) SpO2:  [94 %-100 %] 96 % (10/14 0946) Weight:  [50.9 kg] 50.9 kg (10/14 0703) Constitutional:  oriented to person, place, and time. 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  Mouth/Throat: edentulous Oropharynx is clear and dry . No oropharyngeal exudate.  Shunt under  scalp with no pain, tenderness Cardiovascular: Normal rate, regular rhythm and normal heart sounds.  No murmur heard.  Pulmonary/Chest: Effort normal and breath sounds normal. No respiratory distress.  has no wheezes.  Neck = supple, no nuchal rigidity Abdominal: Soft. Bowel sounds are normal.  exhibits no distension. There is no tenderness.  Lymphadenopathy: no cervical adenopathy. 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 Some contractures on hands Skin: Skin is warm and dry. No rash noted. No erythema.  Psychiatric: a normal mood and affect.  behavior is normal.   Lab Results Recent Labs    01/07/21 0455 01/08/21 0514  WBC 11.3* 21.4*  HGB 9.4* 9.7*  HCT 29.4* 30.6*  NA 136 136  K 4.2 3.9  CL 104 107  CO2 24 25  BUN 15 13  CREATININE 0.62  0.56    Microbiology: Results for orders placed or performed during the hospital encounter of 01/04/21  Resp Panel by RT-PCR (Flu A&B, Covid) Nasopharyngeal Swab     Status: None   Collection Time: 01/04/21 12:05 PM   Specimen: Nasopharyngeal Swab; Nasopharyngeal(NP) swabs in vial transport medium  Result Value Ref Range Status   SARS Coronavirus 2 by RT PCR NEGATIVE NEGATIVE Final    Comment: (NOTE) SARS-CoV-2 target nucleic acids are NOT DETECTED.  The SARS-CoV-2 RNA is generally detectable in upper respiratory specimens during the acute phase of infection. The lowest concentration of SARS-CoV-2 viral copies this assay can detect is 138 copies/mL. A negative result does not preclude  SARS-Cov-2 infection and should not be used as the sole basis for treatment or other patient management decisions. A negative result may occur with  improper specimen collection/handling, submission of specimen other than nasopharyngeal swab, presence of viral mutation(s) within the areas targeted by this assay, and inadequate number of viral copies(<138 copies/mL). A negative result must be combined with clinical observations, patient history, and epidemiological information. The expected result is Negative.  Fact Sheet for Patients:  BloggerCourse.com  Fact Sheet for Healthcare Providers:  SeriousBroker.it  This test is no t yet approved or cleared by the [place] FDA and  has been authorized for detection and/or diagnosis of SARS-CoV-2 by FDA under an Emergency Use Authorization (EUA). This EUA will remain  in effect (meaning this test can be used) for the duration of the COVID-19 declaration under Section 564(b)(1) of the Act, 21 U.S.C.section 360bbb-3(b)(1), unless the authorization is terminated  or revoked sooner.       Influenza A by PCR NEGATIVE NEGATIVE Final   Influenza B by PCR NEGATIVE NEGATIVE Final    Comment: (NOTE) The Xpert Xpress SARS-CoV-2/FLU/RSV plus assay is intended as an aid in the diagnosis of influenza from Nasopharyngeal swab specimens and should not be used as a sole basis for treatment. Nasal washings and aspirates are unacceptable for Xpert Xpress SARS-CoV-2/FLU/RSV testing.  Fact Sheet for Patients: BloggerCourse.com  Fact Sheet for Healthcare Providers: SeriousBroker.it  This test is not yet approved or cleared by the [place] FDA and has been authorized for detection and/or diagnosis of SARS-CoV-2 by FDA under an Emergency Use Authorization (EUA). This EUA will remain in effect (meaning this test can be used) for the duration of  the COVID-19 declaration under Section 564(b)(1) of the Act, 21 U.S.C. section 360bbb-3(b)(1), unless the authorization is terminated or revoked.  Performed at [facility], [address] [place], [place] [address]   Blood culture (routine single)     Status: Abnormal (Preliminary result)   Collection Time: 01/04/21  2:58 PM   Specimen: BLOOD  Result Value Ref Range Status   Specimen Description   Final    BLOOD LEFT ANTECUBITAL Performed at [facility], [address], [place], [place] [address]    Special Requests   Final    BOTTLES DRAWN AEROBIC AND ANAEROBIC Blood Culture adequate volume Performed at [facility], [address] [place], [place] [address]    Culture  Setup Time   Final    YEAST ANAEROBIC BOTTLE ONLY Organism ID to follow CRITICAL RESULT CALLED TO, READ BACK BY AND VERIFIED WITH: SAMANTHA RAUER 01/05/21 1507 SLM IN BOTH AEROBIC AND ANAEROBIC BOTTLES Performed at [facility], [address] [place], [place] [address]    Culture (A)  Final    CANDIDA KRUSEI Sent to [facility] for  further susceptibility testing. Performed at [facility], [address] [place], [place] [address]    Report Status PENDING  Incomplete  Blood Culture ID Panel (Reflexed)     Status: Abnormal   Collection Time: 01/04/21  2:58 PM  Result Value Ref Range Status   Enterococcus faecalis NOT DETECTED NOT DETECTED Final   Enterococcus Faecium NOT DETECTED NOT DETECTED Final   Listeria monocytogenes NOT DETECTED NOT DETECTED Final   Staphylococcus species NOT DETECTED NOT DETECTED Final   Staphylococcus aureus (BCID) NOT DETECTED NOT DETECTED Final   Staphylococcus epidermidis NOT DETECTED NOT DETECTED Final   Staphylococcus lugdunensis NOT DETECTED NOT DETECTED Final   Streptococcus species NOT DETECTED NOT DETECTED Final   Streptococcus agalactiae NOT DETECTED NOT DETECTED Final   Streptococcus pneumoniae NOT DETECTED NOT DETECTED  Final   Streptococcus pyogenes NOT DETECTED NOT DETECTED Final   A.calcoaceticus-baumannii NOT DETECTED NOT DETECTED Final   Bacteroides fragilis NOT DETECTED NOT DETECTED Final   Enterobacterales NOT DETECTED NOT DETECTED Final   Enterobacter cloacae complex NOT DETECTED NOT DETECTED Final   Escherichia coli NOT DETECTED NOT DETECTED Final   Klebsiella aerogenes NOT DETECTED NOT DETECTED Final   Klebsiella oxytoca NOT DETECTED NOT DETECTED Final   Klebsiella pneumoniae NOT DETECTED NOT DETECTED Final   Proteus species NOT DETECTED NOT DETECTED Final   Salmonella species NOT DETECTED NOT DETECTED Final   Serratia marcescens NOT DETECTED NOT DETECTED Final   Haemophilus influenzae NOT DETECTED NOT DETECTED Final   Neisseria meningitidis NOT DETECTED NOT DETECTED Final   Pseudomonas aeruginosa NOT DETECTED NOT DETECTED Final   Stenotrophomonas maltophilia NOT DETECTED NOT DETECTED Final   Candida albicans NOT DETECTED NOT DETECTED Final   Candida auris NOT DETECTED NOT DETECTED Final   Candida glabrata NOT DETECTED NOT DETECTED Final   Candida krusei DETECTED (A) NOT DETECTED Final    Comment: CRITICAL RESULT CALLED TO, READ BACK BY AND VERIFIED WITH: SAMANTHA RAUER 01/05/21 1507 SLM    Candida parapsilosis NOT DETECTED NOT DETECTED Final   Candida tropicalis NOT DETECTED NOT DETECTED Final   Cryptococcus neoformans/gattii NOT DETECTED NOT DETECTED Final    Comment: Performed at [facility], [address] [place], [place] [address]  MRSA Next Gen by PCR, Nasal     Status: None   Collection Time: 01/04/21  4:58 PM   Specimen: Nasal Mucosa; Nasal Swab  Result Value Ref Range Status   MRSA by PCR Next Gen NOT DETECTED NOT DETECTED Final    Comment: (NOTE) The GeneXpert MRSA Assay (FDA approved for NASAL specimens only), is one component of a comprehensive MRSA colonization surveillance program. It is not intended to diagnose MRSA infection nor to guide or monitor  treatment for MRSA infections. Test performance is not FDA approved in patients less than 27 years old. Performed at [facility], [address], [place], [place] [address]   Urine Culture     Status: Abnormal   Collection Time: 01/04/21  8:50 PM   Specimen: Urine, Clean Catch  Result Value Ref Range Status   Specimen Description   Final    URINE, CLEAN CATCH Performed at [facility], [address], [place], [place] [address]    Special Requests   Final    NONE Performed at [facility], [address], [place], [place] [address]    Culture (A)  Final    <10,000 COLONIES/mL INSIGNIFICANT GROWTH Performed at [facility], [address], [place], [place] [address]    Report Status 01/07/2021 FINAL  Final  Gastrointestinal Panel by PCR , Stool     Status: None   Collection Time: 01/05/21  3:01 PM   Specimen: Stool  Result Value Ref Range Status   Campylobacter species NOT DETECTED NOT DETECTED Final   Plesimonas shigelloides NOT DETECTED NOT DETECTED Final   Salmonella species NOT DETECTED NOT DETECTED Final   Yersinia enterocolitica NOT DETECTED NOT DETECTED Final   Vibrio species NOT DETECTED NOT DETECTED Final   Vibrio cholerae NOT DETECTED NOT DETECTED Final   Enteroaggregative E coli (EAEC) NOT DETECTED NOT DETECTED Final   Enteropathogenic E coli (EPEC) NOT DETECTED NOT DETECTED Final   Enterotoxigenic E coli (ETEC) NOT DETECTED NOT DETECTED Final   Shiga like toxin producing E coli (STEC) NOT DETECTED NOT DETECTED Final   Shigella/Enteroinvasive E coli (EIEC) NOT DETECTED NOT DETECTED Final   Cryptosporidium NOT DETECTED NOT DETECTED Final   Cyclospora cayetanensis NOT DETECTED NOT DETECTED Final   Entamoeba histolytica NOT DETECTED NOT DETECTED Final   Giardia lamblia NOT DETECTED NOT DETECTED Final   Adenovirus F40/41 NOT DETECTED NOT DETECTED Final   Astrovirus NOT DETECTED NOT DETECTED Final   Norovirus GI/GII NOT DETECTED NOT  DETECTED Final   Rotavirus A NOT DETECTED NOT DETECTED Final   Sapovirus (I, II, IV, and V) NOT DETECTED NOT DETECTED Final    Comment: Performed at [facility], [address] [place], [place] [address]  C Difficile Quick Screen w PCR reflex     Status: Abnormal   Collection Time: 01/05/21  3:01 PM   Specimen: STOOL  Result Value Ref Range Status   C Diff antigen POSITIVE (A) NEGATIVE Final   C Diff toxin NEGATIVE NEGATIVE Final   C Diff interpretation Results are indeterminate. See PCR results.  Final    Comment: Performed at [facility], [address] [place], [place] [address]  C. Diff by PCR, Reflexed     Status: None   Collection Time: 01/05/21  3:01 PM  Result Value Ref Range Status   Toxigenic C. Difficile by PCR NEGATIVE NEGATIVE Final    Comment: Patient is colonized with non toxigenic C. difficile. May not need treatment unless significant symptoms are present. Performed at [facility], [address] [place], [place] [address]   Culture, blood (single)     Status: None (Preliminary result)   Collection Time: 01/05/21  4:42 PM   Specimen: BLOOD  Result Value Ref Range Status   Specimen Description BLOOD BLOOD LEFT HAND  Final   Special Requests   Final    BOTTLES DRAWN AEROBIC AND ANAEROBIC Blood Culture adequate volume   Culture   Final    NO GROWTH 3 DAYS Performed at [facility], [address], [place], [place] [address]    Report Status PENDING  Incomplete  CSF culture w Gram Stain     Status: None (Preliminary result)   Collection Time: 01/07/21  2:43 PM   Specimen: PATH Cytology CSF; Cerebrospinal Fluid  Result Value Ref Range Status   Specimen Description   Final    CSF Performed at [facility], [address] [place], [place] [address]    Special Requests   Final    NONE Performed at [facility], [address] [place], [place] [address]    Gram Stain   Final    NO ORGANISMS  SEEN WBC PRESENT, PREDOMINANTLY MONONUCLEAR CRITICAL RESULT  CALLED TO, READ BACK BY AND VERIFIED WITH: Shanda Bumps, RN AT [phone] 01/07/21 BY JRH    Culture   Final    NO GROWTH < 24 HOURS Performed at [facility], [address] [place], [place] [address]    Report Status PENDING  Incomplete    Studies/Results: CT ANGIO HEAD NECK W WO CM  Result Date: 01/06/2021 CLINICAL DATA:  Stroke, follow-up EXAM: CT ANGIOGRAPHY HEAD AND NECK TECHNIQUE: Multidetector CT imaging of the head and neck was performed using the standard protocol during bolus administration of intravenous contrast. Multiplanar CT image reconstructions and MIPs were obtained to evaluate the vascular anatomy. Carotid stenosis measurements (when applicable) are obtained utilizing NASCET criteria, using the distal internal carotid diameter as the denominator. CONTRAST:  70mL OMNIPAQUE IOHEXOL 350 MG/ML SOLN COMPARISON:  CT brain 01/04/2021 MRI brain and MRA head and neck 01/06/2021 FINDINGS: CT HEAD FINDINGS Brain: Redemonstrated tethering of the right parietal cortex to a right parietal burr hole. 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 No new evidence of infarction. Redemonstrated right cerebral convexity fluid collection, measuring to 7 mm. Right frontal approach ventriculostomy catheter, with tip approximating the interventricular septum. Unchanged size and configuration of the ventricular system. No acute hemorrhage, mass, mass effect, or midline shift. Vascular: See CTA findings. Skull: Right frontal and left parietal burr holes. No acute osseous abnormality.Prior suboccipital craniectomy. Sinuses: Imaged portions are clear. Orbits: No acute finding. Review of the MIP images confirms the above findings CTA NECK FINDINGS Aortic arch: 2 vessel arch, with common origin of the brachiocephalic and right common carotid arteries. Imaged portion shows no evidence of  aneurysm or dissection. No significant stenosis of the major arch vessel origins. Right carotid system: Complete occlusion of the right internal carotid artery, from the level of the bifurcation to the mid petrous portion (series 14, image 177 and 125). The right common and external carotid arteries appear patent and without significant stenosis or occlusion. Left carotid system: No evidence of dissection, stenosis (50% or greater) or occlusion. Retropharyngeal left internal carotid artery. Vertebral arteries: Non opacification of the majority of the left vertebral artery, with minimal thread-like reconstitution in the mid cervical spine (series 14, image 126), with diminutive left V3 and V4 segments. The right vertebral artery is patent from its origin to the vertebrobasilar junction. Skeleton: No acute osseous abnormality. Other neck: Negative Upper chest: Diffuse ground-glass and nodular opacities in the right greater than left imaged lungs. Review of the MIP images confirms the above findings CTA HEAD FINDINGS Anterior circulation: The right intracranial internal carotid artery reconstitutes in the petrous portion (series 14, image 177) but remains narrow for the remainder of its course to the terminus. Diminutive right MCA, which appears patent. The right A1 is diminutive but patent. Distal right MCA branches appear perfused. The left intracranial internal carotid artery is patent to the terminus, without stenosis or other abnormality. The left A1 and more distal ACA segments are patent. Normal anterior communicating artery. Normal left M1, bifurcation, and distal segments patent. Posterior circulation: The right vertebral artery is patent to the vertebrobasilar junction. Thread-like left V4 segment, which may terminate in PICA. The basilar artery is patent. Superior cerebellar arteries and posterior cerebral arteries are patent. Venous sinuses: As permitted by contrast timing, patent. Anatomic variants: None  significant Review of the MIP images confirms the above findings IMPRESSION: 1. 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 2. Non opacification of the proximal left vertebral artery from its origin, with thread-like enhancement in the mid V2 segment, extending to the V3 and V4 segments. 3. Diminutive right MCA and ACA, which appear patent. No additional intracranial large vessel occlusion. 4. Extensive ground-glass opacities in the imaged lungs, which are new compared to 01/04/2021, concerning for infection. Consider repeat chest CT. 5. Redemonstrated tethering of the right parietal cortex to a right parietal burr hole, with cortical hypodensity likely representing infarcts of varying ages, better evaluated on same-day MRI. These results were called by telephone at the time of interpretation on 01/06/2021 at 6:58 pm to provider Dr. Fran Lowes, who verbally acknowledged these results. Electronically Signed   By: Wiliam Ke M.D.   On: 01/06/2021 18:59   DG Skull 1-3 Views  Result Date: 01/06/2021 CLINICAL DATA:  Complication of ventricular shunt, lateral view only EXAM: SKULL - 1-3 VIEW COMPARISON:  01/04/2021 FINDINGS: Single lateral view of the skull. Shunt set at 100 mm of H2O, with shunt dial at a slightly different angle than on 01/04/2021. No evidence of skull fracture or other focal bone lesion. Otherwise unchanged appearance of right frontal approach ventriculostomy catheter. IMPRESSION: Shunt set at 100 mm H2O. Electronically Signed   By: Wiliam Ke M.D.   On: 01/06/2021 14:27   DG FL GUIDED LUMBAR PUNCTURE  Result Date: 01/07/2021 CLINICAL DATA:  Patient with sepsis and a VP shunt request received for lumbar puncture. EXAM: DIAGNOSTIC LUMBAR PUNCTURE UNDER FLUOROSCOPIC GUIDANCE COMPARISON:  MR brain imaging performed 01/06/2021 report reviewed prior to the procedure. FLUOROSCOPY TIME:  Fluoroscopy Time:  0.2 minute  Radiation Exposure Index (if provided by the fluoroscopic device): 3.0 mGy Number of Acquired Spot Images: 2 PROCEDURE: Informed consent was obtained from the patient prior to the procedure, including potential complications of infection, bleeding, CSF leak requiring additional procedures, paresthesia, headache, allergy, and pain. With the patient prone, the lower back was prepped with Betadine. 1% Lidocaine was used for local anesthesia. Lumbar puncture was performed at the L3-L4 level using a 20 gauge needle with return of colorless CSF with an opening pressure of 20 cm water. Eight ml of CSF were obtained for laboratory studies. The patient tolerated the procedure well and there were no apparent complications. A sterile bandage was applied. IMPRESSION: Technically successful fluoroscopic guided lumbar puncture; opening pressure of 20 cm water. Procedure performed by Pattricia Boss PA-C, supervised by Lesia Hausen. Electronically Signed   By: Lesia Hausen M.D.   On: 01/07/2021 14:55    Assessment/Plan: DEBIE ASHLINE is a 45 y.o. 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 Admitted with falls, weakness and found to have profound anemia. Had large ulcer from NSAIDS noted on EGD. Initially WBC 30 but no fevers. BCX neg initially but now growing Candida krusei. She seems to be back to her baseline mentally. She has CT with possible subacute infarct and possible small SDH.  She denies any recent shunt issues. Unclear if she is neurologically at her baseline.  MRI is pending.    The candidemia source is most likely the gastric ulcer and translocation from the gut.  Need to eval for endocarditis as not clear how long could have been candidemic.  TTE shows abnormality on MV.  I am not convinced the shunt is infected as no peritonitis, no obvious change in her mental status. LP shows  20 wbc and elevated protein reviewed  with neurosurgery and still not really indicative of infection so will monitor. TEE neg for endocarditis  Recommendations Cont eraxis. Sensis pending.  Unsure why on solumedrol but likely cause of her elevated WBC.   Thank you very much for the consult. Will follow with you.  Mick Sell   01/08/2021, 12:44 PM

## 2021-01-08 NOTE — Progress Notes (Addendum)
PROGRESS NOTE    Carolyn Herring  MLY:[id number] DOB: 1976/03/28 DOA: 01/04/2021 PCP: Patient, No Pcp Per (Inactive)   Brief Narrative: Taken from prior notes. 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  She has not seen a neurosurgeon in 3 yrs.  She presented to [facility] ER on 10/10 via EMS from home with recurrent falls, generalized weakness, and back pain.  She is unsure if she has been hitting her head during the falls.  She also endorsed several week hx of abdominal pain and diarrhea.  She was diagnosed with COVID-19 in 08/2020, and has had worsening generalized weakness since the dx.  She has not seen a PCP for evaluation of symptoms.  She does live alone, but her aunt lives next door.  Of note pt able to notify EMS for assistance, however when they arrived at her home she was unable to answer the door.  Therefore,  EMS had to break her door down to gain entry.    On EMS arrival patient was hypotensive with systolic in 60s, heart rate 57.  Blood pressure improved with 2 L of LR bolus.  Lactic acidosis at 3.6, procalcitonin at 24.93, WBCs of 30.1, hemoglobin of 2.7, hematocrit of 10.4 and stool occult positive.  Patient was using Goody powder and ibuprofen frequently for abdominal pain.  Massive blood transfusion protocol activated on arrival to ED but patient only received 2 units of PRBC and 1 unit of FFP.  Hemoglobin improved to 11.8, doubtful that original reading was Real, might be some lab error.  Last hemoglobin in the chart was 14.4 in July 2021. She was started on Zosyn empirically for a possible intra-abdominal infection.  CT abdomen and pelvis was without any acute significant abnormality.  There was a focal peripheral hypodensity in the upper posterior aspect of the spleen, possibly a small cyst, hemangioma or a possible small splenic infarct.  No other abnormality found. Blood cultures  negative in 24 hours, COVID-19 and influenza PCR negative, MRSA swab negative. Chest x-ray without any acute abnormality.  CT head and cervical spine with new hyperdense right subdural collection, without evidence of acute blood products.  There was an underlying loss of gray-white matter differentiation in the right parietal lobe concerning for acute-subacute infarct.  No new focal deficit.  Neurology was consulted and they are recommending MRI and MRA if her shunt device is compatible. GI was also consulted and she is going for EGD today. Patient initially admitted to ICU, TRH to resume care on 01/05/2021  10/11 : patient appears to be at baseline.  Patient denies any obvious bleeding, hematemesis, melena or hematochezia.  She was having some diarrhea for some time.  She is pretty much independent for ADLs and walk with the help of walker.  Lives alone with family keep checking on her.  They are not lives next door.  She denies any fever or chills.  She denies any menorrhagia, stating that her periods are becoming more scant, she thinks she is approaching menopause.   Subjective: No more black stool.  No headache.  Doing better today.  TEE neg.   Assessment & Plan:   Active Problems:   Hemorrhagic shock (HCC)   Fall   Altered mental status   History of brain shunt   Pressure injury of skin  Severe sepsis 2/2 Candidemia --leukocytosis, tachycardia on presenting to ED, and lactic acidosis.  Procalcitonin markedly elevated.   --She initially received Zosyn which was transitioned to Unasyn, which was d/c'ed on 10/13. --started on Eraxis for fungemia --ID consulted --LP on 10/13.   Plan: --cont Eraxis --f/u CSF culture --TEE today, neg  Hypotension, POA, resolved Hemorrhagic shock ruled out --Hypotension on presentation responsive to fluid resuscitation.   --patient only received 2 unit of PRBC and 1 unit of FFP with unusually significant improvement in her hemoglobin to 11.8.   Initial Hgb of 2.7 is likely a lab error. --started on IV solumedrol --switch to prednisone 40 mg daily tomorrow, and quick taper --NS 500 ml x1 today for low BP today (systolic 80's).  Gastric ulcer --history of significant NSAID use. --EGD found giant gastric ulcer that was not actively bleeding.  However, pt had black diarrhea that finally resolved today. --s/p IV PPI gtt for 72 hours Plan: --IV PPI BID --soft diet, per GI rec --has trouble with pills so at discharge will need PPI in capsule form so she can break apart and take twice daily - no blood thinners unless absolutely necessary  Acute blood loss anemia --likely due to gastric ulcer.  pt had black diarrhea that finally resolved today. --GI following --Monitor Hgb  Generalized weakness and falls.   Since prior COVID infection in June 2022. -PT/OT evaluation  Hydrocephalus, and Spina Bifida s/p VP shunt  --Neurosurgery reprogrammed shunt 10/13.  Abnormal CT head.   There was some concern of new hyperdense right subdural collection, without evidence of acute blood products and a concern of acute-subacute infarct in the right parietal lobe. Neurology was consulted.  No new focal deficit.  Patient has baseline less strength on left. --MRI and MRAs done --Neurology following  Diarrhea --likely due to abx and anti-fungal use --No active C diff infection.  GI path neg --Imodium PRN   Objective: Vitals:   01/08/21 0915 01/08/21 0930 01/08/21 0946 01/08/21 1600  BP: (!) 156/101 (!) 154/105 (!) 169/108 (!) 87/73  Pulse:   93 92  Resp: Temp:   98.4 F (36.9 C) 97.9 F (36.6 C)  TempSrc:   Oral Oral  SpO2:   96% 99%  Weight:      Height:        Intake/Output Summary (Last 24 hours) at 01/08/2021 1701 Last data filed at 01/08/2021 1652 Gross per 24 hour  Intake 620 ml  Output 1100 ml  Net -480 ml   Filed Weights   01/06/21 0500 01/07/21 0536 01/08/21 0703  Weight: 49.6 kg 50.9 kg 50.9 kg     Examination:  Constitutional: NAD, AAOx3 HEENT: conjunctivae and lids normal, EOMI CV: No cyanosis.   RESP: normal respiratory effort, on RA Extremities: No effusions, edema in BLE SKIN: warm, dry Neuro: II - XII grossly intact.   Psych: Normal mood and affect.  Appropriate judgement and reason   DVT prophylaxis: SCDs, concern of GI bleed Code Status: Full Family Communication:  Disposition Plan:  Status is: Inpatient   Dispo: The patient is from: Home              Anticipated d/c is to: Home              Patient currently is not medically stable to d/c.  On V antifungal, pending neurosurgery and neuro eval.    Difficult to place patient No                 All the records are reviewed and case  discussed with Care Management/Social Worker. Management plans discussed with the patient, nursing and they are in agreement.  Consultants:  GI PCCM  Procedures:  Antimicrobials:  Unasyn  Data Reviewed: I have personally reviewed following labs and imaging studies  CBC: Recent Labs  Lab 01/04/21 1205 01/04/21 1458 01/04/21 1915 01/05/21 0132 01/05/21 0540 01/06/21 0415 01/07/21 0455 01/08/21 0514  WBC 30.2*  --   --   --  24.2* 18.7* 11.3* 21.4*  NEUTROABS 27.0*  --   --   --   --   --   --   --   HGB 2.7*   < > 11.6* 11.8* 11.8* 11.9* 9.4* 9.7*  HCT 10.4*   < > 33.2* 33.7* 34.7* 36.3 29.4* 30.6*  MCV 65.0*  --   --   --  75.4* 81.4 80.5 83.8  PLT 721*   < > 406*  --  372 288 226 253   < > = values in this interval not displayed.   Basic Metabolic Panel: Recent Labs  Lab 01/04/21 1205 01/05/21 0540 01/06/21 0415 01/07/21 0455 01/08/21 0514  NA 135 138 141 136 136  K 3.4* 4.1 4.6 4.2 3.9  CL 103 107 109 104 107  CO2 23 21* GLUCOSE 144* 122* 124* 110* 115*  BUN 36* 21* CREATININE 0.86 0.75 0.71 0.62 0.56  CALCIUM 8.0* 8.1* 8.6* 8.1* 8.3*  MG  --  2.0  --  2.0 2.0  PHOS  --  2.9  --  2.9  --    GFR: Estimated Creatinine  Clearance: 64.5 mL/min (by C-G formula based on SCr of 0.56 mg/dL). Liver Function Tests: Recent Labs  Lab 01/04/21 1205  AST 20  ALT 13  ALKPHOS 82  BILITOT 0.6  PROT 5.5*  ALBUMIN 2.1*   Recent Labs  Lab 01/04/21 1205  LIPASE 50   No results for input(s): AMMONIA in the last 168 hours. Coagulation Profile: Recent Labs  Lab 01/04/21 1205 01/04/21 1547 01/04/21 1915 01/05/21 0540  INR 1.2 1.2 1.1 1.1   Cardiac Enzymes: Recent Labs  Lab 01/04/21 1205  CKTOTAL 85   BNP (last 3 results) No results for input(s): PROBNP in the last 8760 hours. HbA1C: No results for input(s): HGBA1C in the last 72 hours.  CBG: Recent Labs  Lab 01/04/21 1704  GLUCAP 100*   Lipid Profile: No results for input(s): CHOL, HDL, LDLCALC, TRIG, CHOLHDL, LDLDIRECT in the last 72 hours.  Thyroid Function Tests: No results for input(s): TSH, T4TOTAL, FREET4, T3FREE, THYROIDAB in the last 72 hours. Anemia Panel: No results for input(s): VITAMINB12, FOLATE, FERRITIN, TIBC, IRON, RETICCTPCT in the last 72 hours.  Sepsis Labs: Recent Labs  Lab 01/04/21 1205 01/04/21 1547 01/05/21 0538 01/05/21 0540 01/06/21 0415  PROCALCITON 24.93  --  10.66  --   --   LATICACIDVEN 3.6* 2.5*  --  2.1* 1.2    Recent Results (from the past 240 hour(s))  Resp Panel by RT-PCR (Flu A&B, Covid) Nasopharyngeal Swab     Status: None   Collection Time: 01/04/21 12:05 PM   Specimen: Nasopharyngeal Swab; Nasopharyngeal(NP) swabs in vial transport medium  Result Value Ref Range Status   SARS Coronavirus 2 by RT PCR NEGATIVE NEGATIVE Final    Comment: (NOTE) SARS-CoV-2 target nucleic acids are NOT DETECTED.  The SARS-CoV-2 RNA is generally detectable in upper respiratory specimens during the acute phase of infection. The lowest concentration of SARS-CoV-2 viral copies this assay  can detect is 138 copies/mL. A negative result does not preclude SARS-Cov-2 infection and should not be used as the sole basis for  treatment or other patient management decisions. A negative result may occur with  improper specimen collection/handling, submission of specimen other than nasopharyngeal swab, presence of viral mutation(s) within the areas targeted by this assay, and inadequate number of viral copies(<138 copies/mL). A negative result must be combined with clinical observations, patient history, and epidemiological information. The expected result is Negative.  Fact Sheet for Patients:  BloggerCourse.com  Fact Sheet for Healthcare Providers:  SeriousBroker.it  This test is no t yet approved or cleared by the [place] FDA and  has been authorized for detection and/or diagnosis of SARS-CoV-2 by FDA under an Emergency Use Authorization (EUA). This EUA will remain  in effect (meaning this test can be used) for the duration of the COVID-19 declaration under Section 564(b)(1) of the Act, 21 U.S.C.section 360bbb-3(b)(1), unless the authorization is terminated  or revoked sooner.       Influenza A by PCR NEGATIVE NEGATIVE Final   Influenza B by PCR NEGATIVE NEGATIVE Final    Comment: (NOTE) The Xpert Xpress SARS-CoV-2/FLU/RSV plus assay is intended as an aid in the diagnosis of influenza from Nasopharyngeal swab specimens and should not be used as a sole basis for treatment. Nasal washings and aspirates are unacceptable for Xpert Xpress SARS-CoV-2/FLU/RSV testing.  Fact Sheet for Patients: BloggerCourse.com  Fact Sheet for Healthcare Providers: SeriousBroker.it  This test is not yet approved or cleared by the [place] FDA and has been authorized for detection and/or diagnosis of SARS-CoV-2 by FDA under an Emergency Use Authorization (EUA). This EUA will remain in effect (meaning this test can be used) for the duration of the COVID-19 declaration under Section 564(b)(1) of the Act, 21  U.S.C. section 360bbb-3(b)(1), unless the authorization is terminated or revoked.  Performed at [facility], [address] [place], [place] [address]   Blood culture (routine single)     Status: Abnormal (Preliminary result)   Collection Time: 01/04/21  2:58 PM   Specimen: BLOOD  Result Value Ref Range Status   Specimen Description   Final    BLOOD LEFT ANTECUBITAL Performed at [facility], [address], [place], [place] [address]    Special Requests   Final    BOTTLES DRAWN AEROBIC AND ANAEROBIC Blood Culture adequate volume Performed at [facility], [address] [place], [place] [address]    Culture  Setup Time   Final    YEAST ANAEROBIC BOTTLE ONLY Organism ID to follow CRITICAL RESULT CALLED TO, READ BACK BY AND VERIFIED WITH: SAMANTHA RAUER 01/05/21 1507 SLM IN BOTH AEROBIC AND ANAEROBIC BOTTLES Performed at [facility], [address] [place], [place] [address]    Culture (A)  Final    CANDIDA KRUSEI Sent to [facility] for further susceptibility testing. Performed at [facility], [address], [place], [place] [address]    Report Status PENDING  Incomplete  Blood Culture ID Panel (Reflexed)     Status: Abnormal   Collection Time: 01/04/21  2:58 PM  Result Value Ref Range Status   Enterococcus faecalis NOT DETECTED NOT DETECTED Final   Enterococcus Faecium NOT DETECTED NOT DETECTED Final   Listeria monocytogenes NOT DETECTED NOT DETECTED Final   Staphylococcus species NOT DETECTED NOT DETECTED Final   Staphylococcus aureus (BCID) NOT DETECTED NOT DETECTED Final   Staphylococcus epidermidis NOT DETECTED NOT DETECTED Final  Staphylococcus lugdunensis NOT DETECTED NOT DETECTED Final   Streptococcus species NOT DETECTED NOT DETECTED Final   Streptococcus agalactiae NOT DETECTED NOT DETECTED Final   Streptococcus pneumoniae NOT DETECTED NOT DETECTED Final   Streptococcus pyogenes NOT DETECTED NOT DETECTED Final    A.calcoaceticus-baumannii NOT DETECTED NOT DETECTED Final   Bacteroides fragilis NOT DETECTED NOT DETECTED Final   Enterobacterales NOT DETECTED NOT DETECTED Final   Enterobacter cloacae complex NOT DETECTED NOT DETECTED Final   Escherichia coli NOT DETECTED NOT DETECTED Final   Klebsiella aerogenes NOT DETECTED NOT DETECTED Final   Klebsiella oxytoca NOT DETECTED NOT DETECTED Final   Klebsiella pneumoniae NOT DETECTED NOT DETECTED Final   Proteus species NOT DETECTED NOT DETECTED Final   Salmonella species NOT DETECTED NOT DETECTED Final   Serratia marcescens NOT DETECTED NOT DETECTED Final   Haemophilus influenzae NOT DETECTED NOT DETECTED Final   Neisseria meningitidis NOT DETECTED NOT DETECTED Final   Pseudomonas aeruginosa NOT DETECTED NOT DETECTED Final   Stenotrophomonas maltophilia NOT DETECTED NOT DETECTED Final   Candida albicans NOT DETECTED NOT DETECTED Final   Candida auris NOT DETECTED NOT DETECTED Final   Candida glabrata NOT DETECTED NOT DETECTED Final   Candida krusei DETECTED (A) NOT DETECTED Final    Comment: CRITICAL RESULT CALLED TO, READ BACK BY AND VERIFIED WITH: SAMANTHA RAUER 01/05/21 1507 SLM    Candida parapsilosis NOT DETECTED NOT DETECTED Final   Candida tropicalis NOT DETECTED NOT DETECTED Final   Cryptococcus neoformans/gattii NOT DETECTED NOT DETECTED Final    Comment: Performed at [facility], [address] [place], [place] [address]  MRSA Next Gen by PCR, Nasal     Status: None   Collection Time: 01/04/21  4:58 PM   Specimen: Nasal Mucosa; Nasal Swab  Result Value Ref Range Status   MRSA by PCR Next Gen NOT DETECTED NOT DETECTED Final    Comment: (NOTE) The GeneXpert MRSA Assay (FDA approved for NASAL specimens only), is one component of a comprehensive MRSA colonization surveillance program. It is not intended to diagnose MRSA infection nor to guide or monitor treatment for MRSA infections. Test performance is not FDA approved in  patients less than 67 years old. Performed at [facility], [address] [place], [place] [address]   Urine Culture     Status: Abnormal   Collection Time: 01/04/21  8:50 PM   Specimen: Urine, Clean Catch  Result Value Ref Range Status   Specimen Description   Final    URINE, CLEAN CATCH Performed at [facility], [address], [place], [place] [address]    Special Requests   Final    NONE Performed at [facility], [address] [place], [place] [address]    Culture (A)  Final    <10,000 COLONIES/mL INSIGNIFICANT GROWTH Performed at [facility], [address] [place], [place] [address]    Report Status 01/07/2021 FINAL  Final  Gastrointestinal Panel by PCR , Stool     Status: None   Collection Time: 01/05/21  3:01 PM   Specimen: Stool  Result Value Ref Range Status   Campylobacter species NOT DETECTED NOT DETECTED Final   Plesimonas shigelloides NOT DETECTED NOT DETECTED Final   Salmonella species NOT DETECTED NOT DETECTED Final   Yersinia enterocolitica NOT DETECTED NOT DETECTED Final   Vibrio species NOT DETECTED NOT DETECTED Final   Vibrio cholerae NOT DETECTED NOT DETECTED Final   Enteroaggregative E coli (EAEC) NOT DETECTED NOT  DETECTED Final   Enteropathogenic E coli (EPEC) NOT DETECTED NOT DETECTED Final   Enterotoxigenic E coli (ETEC) NOT DETECTED NOT DETECTED Final   Shiga like toxin producing E coli (STEC) NOT DETECTED NOT DETECTED Final   Shigella/Enteroinvasive E coli (EIEC) NOT DETECTED NOT DETECTED Final   Cryptosporidium NOT DETECTED NOT DETECTED Final   Cyclospora cayetanensis NOT DETECTED NOT DETECTED Final   Entamoeba histolytica NOT DETECTED NOT DETECTED Final   Giardia lamblia NOT DETECTED NOT DETECTED Final   Adenovirus F40/41 NOT DETECTED NOT DETECTED Final   Astrovirus NOT DETECTED NOT DETECTED Final   Norovirus GI/GII NOT DETECTED NOT DETECTED Final   Rotavirus A NOT DETECTED NOT DETECTED Final    Sapovirus (I, II, IV, and V) NOT DETECTED NOT DETECTED Final    Comment: Performed at [facility], [address] [place], [place] [address]  C Difficile Quick Screen w PCR reflex     Status: Abnormal   Collection Time: 01/05/21  3:01 PM   Specimen: STOOL  Result Value Ref Range Status   C Diff antigen POSITIVE (A) NEGATIVE Final   C Diff toxin NEGATIVE NEGATIVE Final   C Diff interpretation Results are indeterminate. See PCR results.  Final    Comment: Performed at [facility], [address] [place], [place] [address]  C. Diff by PCR, Reflexed     Status: None   Collection Time: 01/05/21  3:01 PM  Result Value Ref Range Status   Toxigenic C. Difficile by PCR NEGATIVE NEGATIVE Final    Comment: Patient is colonized with non toxigenic C. difficile. May not need treatment unless significant symptoms are present. Performed at [facility], [address] [place], [place] [address]   Culture, blood (single)     Status: None (Preliminary result)   Collection Time: 01/05/21  4:42 PM   Specimen: BLOOD  Result Value Ref Range Status   Specimen Description BLOOD BLOOD LEFT HAND  Final   Special Requests   Final    BOTTLES DRAWN AEROBIC AND ANAEROBIC Blood Culture adequate volume   Culture   Final    NO GROWTH 3 DAYS Performed at [facility], [address], [place], [place] [address]    Report Status PENDING  Incomplete  CSF culture w Gram Stain     Status: None (Preliminary result)   Collection Time: 01/07/21  2:43 PM   Specimen: PATH Cytology CSF; Cerebrospinal Fluid  Result Value Ref Range Status   Specimen Description   Final    CSF Performed at [facility], [address], [place], [place] [address]    Special Requests   Final    NONE Performed at [facility], [address] [place], [place] [address]    Gram Stain   Final    NO ORGANISMS SEEN WBC PRESENT, PREDOMINANTLY MONONUCLEAR CRITICAL RESULT CALLED TO,  READ BACK BY AND VERIFIED WITH: Shanda Bumps, RN AT [phone] 01/07/21 BY JRH    Culture   Final    NO GROWTH < 24 HOURS Performed at [facility], [address] [place], [place] [address]    Report Status PENDING  Incomplete     Radiology Studies: CT ANGIO HEAD NECK W WO CM  Result Date: 01/06/2021 CLINICAL DATA:  Stroke, follow-up EXAM: CT ANGIOGRAPHY HEAD AND NECK TECHNIQUE: Multidetector CT imaging of the head and neck was performed using the standard protocol during bolus administration of intravenous contrast. Multiplanar CT image reconstructions and MIPs  were obtained to evaluate the vascular anatomy. Carotid stenosis measurements (when applicable) are obtained utilizing NASCET criteria, using the distal internal carotid diameter as the denominator. CONTRAST:  39mL OMNIPAQUE IOHEXOL 350 MG/ML SOLN COMPARISON:  CT brain 01/04/2021 MRI brain and MRA head and neck 01/06/2021 FINDINGS: CT HEAD FINDINGS Brain: Redemonstrated tethering of the right parietal cortex to a right parietal burr hole. 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 No new evidence of infarction. Redemonstrated right cerebral convexity fluid collection, measuring to 7 mm. Right frontal approach ventriculostomy catheter, with tip approximating the interventricular septum. Unchanged size and configuration of the ventricular system. No acute hemorrhage, mass, mass effect, or midline shift. Vascular: See CTA findings. Skull: Right frontal and left parietal burr holes. No acute osseous abnormality.Prior suboccipital craniectomy. Sinuses: Imaged portions are clear. Orbits: No acute finding. Review of the MIP images confirms the above findings CTA NECK FINDINGS Aortic arch: 2 vessel arch, with common origin of the brachiocephalic and right common carotid arteries. Imaged portion shows no evidence of aneurysm or dissection. No significant stenosis of the major arch vessel  origins. Right carotid system: Complete occlusion of the right internal carotid artery, from the level of the bifurcation to the mid petrous portion (series 14, image 177 and 125). The right common and external carotid arteries appear patent and without significant stenosis or occlusion. Left carotid system: No evidence of dissection, stenosis (50% or greater) or occlusion. Retropharyngeal left internal carotid artery. Vertebral arteries: Non opacification of the majority of the left vertebral artery, with minimal thread-like reconstitution in the mid cervical spine (series 14, image 126), with diminutive left V3 and V4 segments. The right vertebral artery is patent from its origin to the vertebrobasilar junction. Skeleton: No acute osseous abnormality. Other neck: Negative Upper chest: Diffuse ground-glass and nodular opacities in the right greater than left imaged lungs. Review of the MIP images confirms the above findings CTA HEAD FINDINGS Anterior circulation: The right intracranial internal carotid artery reconstitutes in the petrous portion (series 14, image 177) but remains narrow for the remainder of its course to the terminus. Diminutive right MCA, which appears patent. The right A1 is diminutive but patent. Distal right MCA branches appear perfused. The left intracranial internal carotid artery is patent to the terminus, without stenosis or other abnormality. The left A1 and more distal ACA segments are patent. Normal anterior communicating artery. Normal left M1, bifurcation, and distal segments patent. Posterior circulation: The right vertebral artery is patent to the vertebrobasilar junction. Thread-like left V4 segment, which may terminate in PICA. The basilar artery is patent. Superior cerebellar arteries and posterior cerebral arteries are patent. Venous sinuses: As permitted by contrast timing, patent. Anatomic variants: None significant Review of the MIP images confirms the above findings  IMPRESSION: 1. 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 2. Non opacification of the proximal left vertebral artery from its origin, with thread-like enhancement in the mid V2 segment, extending to the V3 and V4 segments. 3. Diminutive right MCA and ACA, which appear patent. No additional intracranial large vessel occlusion. 4. Extensive ground-glass opacities in the imaged lungs, which are new compared to 01/04/2021, concerning for infection. Consider repeat chest CT. 5. Redemonstrated tethering of the right parietal cortex to a right parietal burr hole, with cortical hypodensity likely representing infarcts of varying ages, better evaluated on same-day MRI. These results were called by telephone at the time  of interpretation on 01/06/2021 at 6:58 pm to provider Dr. Fran Lowes, who verbally acknowledged these results. Electronically Signed   By: Wiliam Ke M.D.   On: 01/06/2021 18:59   DG FL GUIDED LUMBAR PUNCTURE  Result Date: 01/07/2021 CLINICAL DATA:  Patient with sepsis and a VP shunt request received for lumbar puncture. EXAM: DIAGNOSTIC LUMBAR PUNCTURE UNDER FLUOROSCOPIC GUIDANCE COMPARISON:  MR brain imaging performed 01/06/2021 report reviewed prior to the procedure. FLUOROSCOPY TIME:  Fluoroscopy Time:  0.2 minute Radiation Exposure Index (if provided by the fluoroscopic device): 3.0 mGy Number of Acquired Spot Images: 2 PROCEDURE: Informed consent was obtained from the patient prior to the procedure, including potential complications of infection, bleeding, CSF leak requiring additional procedures, paresthesia, headache, allergy, and pain. With the patient prone, the lower back was prepped with Betadine. 1% Lidocaine was used for local anesthesia. Lumbar puncture was performed at the L3-L4 level using a 20 gauge needle with return of colorless CSF with an opening pressure of 20 cm water. Eight ml of CSF were obtained  for laboratory studies. The patient tolerated the procedure well and there were no apparent complications. A sterile bandage was applied. IMPRESSION: Technically successful fluoroscopic guided lumbar puncture; opening pressure of 20 cm water. Procedure performed by Pattricia Boss PA-C, supervised by Lesia Hausen. Electronically Signed   By: Lesia Hausen M.D.   On: 01/07/2021 14:55    Scheduled Meds:  budesonide (PULMICORT) nebulizer solution  0.5 mg Nebulization BID   Chlorhexidine Gluconate Cloth  6 each Topical Q0600   methylPREDNISolone (SOLU-MEDROL) injection  20 mg Intravenous Q12H   pantoprazole  40 mg Intravenous Q12H   sodium chloride flush       Continuous Infusions:  anidulafungin 100 mg (01/08/21 1415)     LOS: 4 days    Darlin Priestly, MD [facility]  If 7PM-7AM, please contact night-coverage [email or web address]  01/08/2021, 5:01 PM

## 2021-01-08 NOTE — Anesthesia Preprocedure Evaluation (Signed)
Anesthesia Evaluation  Patient identified by MRN, date of birth, ID band Patient awake    Reviewed: Allergy & Precautions, H&P , NPO status , Patient's Chart, lab work & pertinent test results, reviewed documented beta blocker date and time   Airway Mallampati: II   Neck ROM: full    Dental  (+) Poor Dentition   Pulmonary neg pulmonary ROS, Current Smoker,    Pulmonary exam normal        Cardiovascular Exercise Tolerance: Poor hypertension, On Medications negative cardio ROS Normal cardiovascular exam Rhythm:regular Rate:Normal     Neuro/Psych neg Seizures Hx of spina bifida and VP shunt. ja negative neurological ROS  negative psych ROS   GI/Hepatic negative GI ROS, Neg liver ROS,   Endo/Other  negative endocrine ROS  Renal/GU negative Renal ROS  negative genitourinary   Musculoskeletal   Abdominal   Peds  Hematology negative hematology ROS (+)   Anesthesia Other Findings Past Medical History: No date: Hypertension No date: Spina bifida [facility]) Past Surgical History: 01/05/2021: ESOPHAGOGASTRODUODENOSCOPY (EGD) WITH PROPOFOL; N/A     Comment:  Procedure: ESOPHAGOGASTRODUODENOSCOPY (EGD) WITH               PROPOFOL;  Surgeon: Regis Bill, MD;  Location:               [facility] ENDOSCOPY;  Service: Endoscopy;  Laterality: N/A; BMI    Body Mass Index: 21.92 kg/m     Reproductive/Obstetrics negative OB ROS                             Anesthesia Physical Anesthesia Plan  ASA: 3  Anesthesia Plan: General   Post-op Pain Management:    Induction:   PONV Risk Score and Plan:   Airway Management Planned:   Additional Equipment:   Intra-op Plan:   Post-operative Plan:   Informed Consent: I have reviewed the patients History and Physical, chart, labs and discussed the procedure including the risks, benefits and alternatives for the proposed anesthesia with the patient or  authorized representative who has indicated his/her understanding and acceptance.     Dental Advisory Given  Plan Discussed with: CRNA  Anesthesia Plan Comments: (Situation discussed with Dr. Mariah Milling and neurology regarding Mental status.  Per ongoing discussion, the neurologist feels the patient is stable for GA for the above procedure. They desire to obtain the TEE result secondary to the ongoing evaluation for the source of the fungemia and elevated white count. Pt has been hemodynamically stable following MTP and pt is currently awake and alert for the anesthesia history and appears appropriate while answering questions. JA)        Anesthesia Quick Evaluation

## 2021-01-09 DIAGNOSIS — K922 Gastrointestinal hemorrhage, unspecified: Secondary | ICD-10-CM | POA: Diagnosis not present

## 2021-01-09 LAB — BASIC METABOLIC PANEL
Anion gap: 7 (ref 5–15)
BUN: 21 mg/dL — ABNORMAL HIGH (ref 6–20)
CO2: 24 mmol/L (ref 22–32)
Calcium: 7.8 mg/dL — ABNORMAL LOW (ref 8.9–10.3)
Chloride: 105 mmol/L (ref 98–111)
Creatinine, Ser: 0.66 mg/dL (ref 0.44–1.00)
GFR, Estimated: >60 mL/min (ref >60)
Glucose, Bld: 95 mg/dL (ref 70–99)
Potassium: 3.6 mmol/L (ref 3.5–5.1)
Sodium: 136 mmol/L (ref 135–145)

## 2021-01-09 LAB — HEMOGLOBIN AND HEMATOCRIT, BLOOD
HCT: 19.4 % — ABNORMAL LOW (ref 36.0–46.0)
HCT: 36.8 % (ref 36.0–46.0)
Hemoglobin: 5.9 g/dL — ABNORMAL LOW (ref 12.0–15.0)
Hemoglobin: 12.1 g/dL (ref 12.0–15.0)

## 2021-01-09 LAB — CBC
HCT: 19 % — ABNORMAL LOW (ref 36.0–46.0)
Hemoglobin: 5.9 g/dL — ABNORMAL LOW (ref 12.0–15.0)
MCH: 26.6 pg (ref 26.0–34.0)
MCHC: 31.1 g/dL (ref 30.0–36.0)
MCV: 85.6 fL (ref 80.0–100.0)
Platelets: 244 10*3/uL (ref 150–400)
RBC: 2.22 MIL/uL — ABNORMAL LOW (ref 3.87–5.11)
RDW: 25.4 % — ABNORMAL HIGH (ref 11.5–15.5)
WBC: 22 10*3/uL — ABNORMAL HIGH (ref 4.0–10.5)
nRBC: 0.3 % — ABNORMAL HIGH (ref 0.0–0.2)

## 2021-01-09 LAB — PREPARE RBC (CROSSMATCH)

## 2021-01-09 LAB — MAGNESIUM: Magnesium: 1.9 mg/dL (ref 1.7–2.4)

## 2021-01-09 MED ORDER — PREDNISONE 10 MG PO TABS: 10.0000 mg | ORAL_TABLET | Freq: Every day | ORAL | Status: DC

## 2021-01-09 MED ORDER — TRAMADOL HCL 50 MG PO TABS: 50.0000 mg | ORAL_TABLET | Freq: Four times a day (QID) | ORAL | Status: DC | PRN

## 2021-01-09 MED ORDER — TRAMADOL HCL 50 MG PO TABS: 100.0000 mg | ORAL_TABLET | Freq: Four times a day (QID) | ORAL | Status: DC | PRN

## 2021-01-09 MED ORDER — PREDNISONE 20 MG PO TABS: 20.0000 mg | ORAL_TABLET | Freq: Every day | ORAL | Status: DC

## 2021-01-09 MED ORDER — SODIUM CHLORIDE 0.9 % IV SOLN: INTRAVENOUS | Status: DC

## 2021-01-09 MED ORDER — SODIUM CHLORIDE 0.9% IV SOLUTION: Freq: Once | INTRAVENOUS | Status: AC

## 2021-01-09 MED ORDER — PANTOPRAZOLE INFUSION (NEW) - SIMPLE MED
8.0000 mg/h | INTRAVENOUS | Status: AC
  Administered 2021-01-09 – 2021-01-10 (×2): 8 mg/h via INTRAVENOUS
  Filled 2021-01-09 (×2): qty 80

## 2021-01-09 NOTE — H&P (View-Only) (Signed)
[facility] Gastroenterology Inpatient Progress Note  Subjective: Patient seen for follow up with attending MD messaging me for recurrent Gastrointestinal bleeding. Patient reportedly had very large melenic stool early this AM with concurrent drop in Hgb to 5.9 today from 9.7  obtained yesterday. Patient seems very somnolent but is oriented x 3 and is aware of her medical issues leading to her hospitalization.  Objective: Vital signs in last 24 hours: Temp:  [97.9 F (36.6 C)-99.7 F (37.6 C)] 98.9 F (37.2 C) (10/15 1436) Pulse Rate:  [86-102] 94 (10/15 1436) Resp:  [16-18] 16 (10/15 1436) BP: (87-147)/(72-101) 147/97 (10/15 1436) SpO2:  [95 %-99 %] 96 % (10/15 1436) Weight:  [54.2 kg] 54.2 kg (10/15 0500) Blood pressure (!) 147/97, pulse 94, temperature 98.9 F (37.2 C), temperature source Oral, resp. rate 16, height 5' (1.524 m), weight 54.2 kg, SpO2 96 %.    Intake/Output from previous day: 10/14 0701 - 10/15 0700 In: 1622.7 [P.O.:360; I.V.:500; IV Piggyback:762.7] Out: 1100 [Urine:1100]  Intake/Output this shift: Total I/O In: 307.2 [I.V.:13.2; Blood:294] Out: 800 [Urine:800]   Gen: Somnolent. NAD. Appears pale and overall medically ill.  HEENT: [place]/AT. PERRLA. Normal external ear exam.  Chest: CTA, no wheezes.  CV: RR nl S1, S2. No gallops.  Abd: soft, nt, nd. BS+  Ext: no edema. Pulses 2+  Neuro: Alert and oriented. Judgement appears normal. Nonfocal.   Lab Results: Results for orders placed or performed during the hospital encounter of 01/04/21 (from the past 24 hour(s))  CBC     Status: Abnormal   Collection Time: 01/09/21  4:38 AM  Result Value Ref Range   WBC 22.0 (H) 4.0 - 10.5 K/uL   RBC 2.22 (L) 3.87 - 5.11 MIL/uL   Hemoglobin 5.9 (L) 12.0 - 15.0 g/dL   HCT 58.5 (L) 27.7 - 82.4 %   MCV 85.6 80.0 - 100.0 fL   MCH 26.6 26.0 - 34.0 pg   MCHC 31.1 30.0 - 36.0 g/dL   RDW 23.5 (H) 36.1 - 44.3 %   Platelets 244 150 - 400 K/uL   nRBC 0.3 (H) 0.0 -  0.2 %  Basic metabolic panel     Status: Abnormal   Collection Time: 01/09/21  4:38 AM  Result Value Ref Range   Sodium 136 135 - 145 mmol/L   Potassium 3.6 3.5 - 5.1 mmol/L   Chloride 105 98 - 111 mmol/L   CO2 24 22 - 32 mmol/L   Glucose, Bld 95 70 - 99 mg/dL   BUN 21 (H) 6 - 20 mg/dL   Creatinine, Ser 1.54 0.44 - 1.00 mg/dL   Calcium 7.8 (L) 8.9 - 10.3 mg/dL   GFR, Estimated >00 >86 mL/min   Anion gap 7 5 - 15  Magnesium     Status: None   Collection Time: 01/09/21  4:38 AM  Result Value Ref Range   Magnesium 1.9 1.7 - 2.4 mg/dL  Type and screen [facility]     Status: None (Preliminary result)   Collection Time: 01/09/21  8:02 AM  Result Value Ref Range   ABO/RH(D) O NEG    Antibody Screen NEG    Sample Expiration 01/12/2021,2359    Unit Number [id number]    Blood Component Type RED CELLS,LR    Unit division 00    Status of Unit ISSUED    Transfusion Status OK TO TRANSFUSE    Crossmatch Result COMPATIBLE    Unit Number [id number]    Blood Component Type  RBC LR PHER1    Unit division 00    Status of Unit ISSUED    Transfusion Status OK TO TRANSFUSE    Crossmatch Result      COMPATIBLE Performed at [facility], [address] [place], [place] [address]   Prepare RBC (crossmatch)     Status: None   Collection Time: 01/09/21  8:02 AM  Result Value Ref Range   Order Confirmation      ORDER PROCESSED BY BLOOD BANK Performed at [facility], [address] [place], [place] [address]   Hemoglobin and hematocrit, blood     Status: Abnormal   Collection Time: 01/09/21  8:02 AM  Result Value Ref Range   Hemoglobin 5.9 (L) 12.0 - 15.0 g/dL   HCT 09.8 (L) 11.9 - 14.7 %     Recent Labs    01/07/21 0455 01/08/21 0514 01/09/21 0438 01/09/21 0802  WBC 11.3* 21.4* 22.0*  --   HGB 9.4* 9.7* 5.9* 5.9*  HCT 29.4* 30.6* 19.0* 19.4*  PLT 226 253 244  --    BMET Recent Labs    01/07/21 0455 01/08/21 0514 01/09/21 0438   NA 136 136 136  K 4.2 3.9 3.6  CL 104 107 105  CO2 24 25 24   GLUCOSE 110* 115* 95  BUN 15 13 21*  CREATININE 0.62 0.56 0.66  CALCIUM 8.1* 8.3* 7.8*   LFT No results for input(s): PROT, ALBUMIN, AST, ALT, ALKPHOS, BILITOT, BILIDIR, IBILI in the last 72 hours. PT/INR No results for input(s): LABPROT, INR in the last 72 hours. Hepatitis Panel No results for input(s): HEPBSAG, HCVAB, HEPAIGM, HEPBIGM in the last 72 hours. C-Diff No results for input(s): CDIFFTOX in the last 72 hours. No results for input(s): CDIFFPCR in the last 72 hours.   Studies/Results: ECHO TEE  Result Date: 01/08/2021    TRANSESOPHOGEAL ECHO REPORT   Patient Name:   Carolyn Herring Date of Exam: 01/08/2021 Medical Rec #:  01/10/2021        Height:       60.0 in Accession #:    [id number]       Weight:       112.2 lb Date of Birth:  [birth year]       BSA:          1.460 m Patient Age:    44 years         BP:           134/95 mmHg Patient Gender: F                HR:           84 bpm. Exam Location:  [facility] Procedure: Transesophageal Echo, Cardiac Doppler, Color Doppler and Saline            Contrast Bubble Study Indications:     Fever R50.9  History:         Patient has prior history of Echocardiogram examinations, most                  recent 01/05/2021. Risk Factors:Hypertension.  Sonographer:     03/07/2021 Referring Phys:  Cristela Blue CADENCE H FURTH Diagnosing Phys: [id number] MD PROCEDURE: After discussion of the risks and benefits of a TEE, an informed consent was obtained from the patient. The transesophogeal probe was passed without difficulty through the esophogus of the patient. Local oropharyngeal anesthetic was provided with Benzocaine spray and Cetacaine. Sedation performed by different physician.  Image quality was excellent. The patient's vital signs; including heart rate, blood pressure, and oxygen saturation; remained stable throughout the procedure. The patient developed no complications during the  procedure. IMPRESSIONS  1. No valve vegetation noted/no endocarditis  2. Left ventricular ejection fraction, by estimation, is 60 to 65%. The left ventricle has normal function. The left ventricle has no regional wall motion abnormalities.  3. Right ventricular systolic function is normal. The right ventricular size is normal.  4. The mitral valve is normal in structure. Mild to moderate mitral valve regurgitation. No evidence of mitral stenosis. Possible mild prolapse of the posterior leaflet, P2 scallop  5. The aortic valve is normal in structure. Aortic valve regurgitation is not visualized. No aortic stenosis is present.  6. The inferior vena cava is normal in size with greater than 50% respiratory variability, suggesting right atrial pressure of 3 mmHg.  7. Agitated saline contrast bubble study was negative, with no evidence of any interatrial shunt.  8. No left atrial/left atrial appendage thrombus was detected. Conclusion(s)/Recommendation(s): Normal biventricular function without evidence of hemodynamically significant valvular heart disease. FINDINGS  Left Ventricle: Left ventricular ejection fraction, by estimation, is 60 to 65%. The left ventricle has normal function. The left ventricle has no regional wall motion abnormalities. The left ventricular internal cavity size was normal in size. There is  no left ventricular hypertrophy. Right Ventricle: The right ventricular size is normal. No increase in right ventricular wall thickness. Right ventricular systolic function is normal. Left Atrium: Left atrial size was normal in size. No left atrial/left atrial appendage thrombus was detected. Right Atrium: Right atrial size was normal in size. Pericardium: There is no evidence of pericardial effusion. Mitral Valve: The mitral valve is normal in structure. Mild to moderate mitral valve regurgitation. No evidence of mitral valve stenosis. Tricuspid Valve: The tricuspid valve is normal in structure. Tricuspid  valve regurgitation is mild . No evidence of tricuspid stenosis. Aortic Valve: The aortic valve is normal in structure. Aortic valve regurgitation is not visualized. No aortic stenosis is present. Pulmonic Valve: The pulmonic valve was normal in structure. Pulmonic valve regurgitation is mild. No evidence of pulmonic stenosis. Aorta: The aortic root is normal in size and structure. There is minimal (Grade I) atheroma plaque involving the descending aorta and transverse aorta. Venous: The inferior vena cava is normal in size with greater than 50% respiratory variability, suggesting right atrial pressure of 3 mmHg. IAS/Shunts: No atrial level shunt detected by color flow Doppler. Agitated saline contrast was given intravenously to evaluate for intracardiac shunting. Agitated saline contrast bubble study was negative, with no evidence of any interatrial shunt. Julien Nordmann MD Electronically signed by Julien Nordmann MD Signature Date/Time: 01/08/2021/6:02:57 PM    Final     Scheduled Inpatient Medications:    budesonide (PULMICORT) nebulizer solution  0.5 mg Nebulization BID   pantoprazole  40 mg Intravenous Q12H   [START ON 01/11/2021] predniSONE  20 mg Oral Q breakfast   Followed by   Melene Muller ON 01/13/2021] predniSONE  10 mg Oral Q breakfast   predniSONE  40 mg Oral Q breakfast    Continuous Inpatient Infusions:    sodium chloride     anidulafungin Stopped (01/08/21 1634)    PRN Inpatient Medications:  acetaminophen, hydrALAZINE, ipratropium-albuterol, loperamide, ondansetron (ZOFRAN) IV, traMADol  Miscellaneous: N/A  Assessment:  Large Gastric Ulcer s/p EGD on 01/05/21 by Dr. Mia Creek. Reportedly a large adherent clot that did not wash off. Appears to have re-bled at this time.  Acute posthemorrhagic anemia. Secondary to #1 above. Spina Bifida - with exacerbation of pain in back, weakness and recurrent falls at home. 4.   Hypotension presumably from GI bleeding, but also possibly from  candidemia sepsis.  Plan:  Clear liquid diet. NPO after MN. Will switch to IV protonix gtt from BID dosing. 4.   Transfuse to Hgb > 7. 5. EGD in AM. The patient understands the nature of the planned procedure, indications, risks, alternatives and potential complications including but not limited to bleeding, infection, perforation, damage to internal organs and possible oversedation/side effects from anesthesia. The patient agrees and gives consent to proceed.  6. Will follow along closely.   Latrenda Irani K. Norma Fredrickson, M.D. 01/09/2021, 3:01 PM

## 2021-01-09 NOTE — Progress Notes (Signed)
PROGRESS NOTE    Carolyn Herring  VVO:[id number] DOB: Jul 28, 1975 DOA: 01/04/2021 PCP: Patient, No Pcp Per (Inactive)   Brief Narrative: Taken from prior notes. 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  She has not seen a neurosurgeon in 3 yrs.  She presented to [facility] ER on 10/10 via EMS from home with recurrent falls, generalized weakness, and back pain.  She is unsure if she has been hitting her head during the falls.  She also endorsed several week hx of abdominal pain and diarrhea.  She was diagnosed with COVID-19 in 08/2020, and has had worsening generalized weakness since the dx.  She has not seen a PCP for evaluation of symptoms.  She does live alone, but her aunt lives next door.  Of note pt able to notify EMS for assistance, however when they arrived at her home she was unable to answer the door.  Therefore,  EMS had to break her door down to gain entry.    On EMS arrival patient was hypotensive with systolic in 60s, heart rate 57.  Blood pressure improved with 2 L of LR bolus.  Lactic acidosis at 3.6, procalcitonin at 24.93, WBCs of 30.1, hemoglobin of 2.7, hematocrit of 10.4 and stool occult positive.  Patient was using Goody powder and ibuprofen frequently for abdominal pain.  Massive blood transfusion protocol activated on arrival to ED but patient only received 2 units of PRBC and 1 unit of FFP.  Hemoglobin improved to 11.8, doubtful that original reading was Real, might be some lab error.  Last hemoglobin in the chart was 14.4 in July 2021. She was started on Zosyn empirically for a possible intra-abdominal infection.  CT abdomen and pelvis was without any acute significant abnormality.  There was a focal peripheral hypodensity in the upper posterior aspect of the spleen, possibly a small cyst, hemangioma or a possible small splenic infarct.  No other abnormality found. Blood cultures  negative in 24 hours, COVID-19 and influenza PCR negative, MRSA swab negative. Chest x-ray without any acute abnormality.  CT head and cervical spine with new hyperdense right subdural collection, without evidence of acute blood products.  There was an underlying loss of gray-white matter differentiation in the right parietal lobe concerning for acute-subacute infarct.  No new focal deficit.  Neurology was consulted and they are recommending MRI and MRA if her shunt device is compatible. GI was also consulted and she is going for EGD today. Patient initially admitted to ICU, TRH to resume care on 01/05/2021  10/11 : patient appears to be at baseline.  Patient denies any obvious bleeding, hematemesis, melena or hematochezia.  She was having some diarrhea for some time.  She is pretty much independent for ADLs and walk with the help of walker.  Lives alone with family keep checking on her.  They are not lives next door.  She denies any fever or chills.  She denies any menorrhagia, stating that her periods are becoming more scant, she thinks she is approaching menopause.   Subjective: Overnight, pt had 1 large BM with clots.  Hgb dropped to 5.9 this morning.  Getting 2u pRBC.  GI following.  Pt complained of pain all over.     Assessment & Plan:   Active Problems:   Hemorrhagic shock (HCC)   Fall   Altered mental status   History of brain shunt   Pressure  injury of skin  Severe sepsis 2/2 Candidemia --leukocytosis, tachycardia on presenting to ED, and lactic acidosis.  Procalcitonin markedly elevated.   --She initially received Zosyn which was transitioned to Unasyn, which was d/c'ed on 10/13. --started on Eraxis for fungemia --ID consulted --LP on 10/13.   --TEE 10/14, neg Plan: --cont Eraxis --f/u CSF culture  Hypotension, POA, resolved Hemorrhagic shock ruled out --Hypotension on presentation responsive to fluid resuscitation.   --patient only received 2 unit of PRBC and 1  unit of FFP with unusually significant improvement in her hemoglobin to 11.8.  Initial Hgb of 2.7 is likely a lab error. --started on IV solumedrol, switched to prednisone for a quick taper.  Gastric ulcer --history of significant NSAID use. --EGD found giant gastric ulcer that was not actively bleeding.  However, pt had black diarrhea that finally resolved today. --s/p IV PPI gtt for 72 hours Plan: --resume IV PPI gtt since pt apparently had another upper GI bleed --clear liquid today --likely repeat EGD tomorrow --has trouble with pills so at discharge will need PPI in capsule form so she can break apart and take twice daily - no blood thinners unless absolutely necessary  Acute blood loss anemia --likely due to gastric ulcer.  pt had black diarrhea that resolved on 10/14 but returned overnight. --Hgb dropped to 5.9 this morning.  Plan: --2u pRBC today --GI to repeat EGD tomorrow  Generalized weakness and falls.   Since prior COVID infection in June 2022. -PT/OT evaluation  Hydrocephalus, and Spina Bifida s/p VP shunt  --Neurosurgery reprogrammed shunt 10/13.  Abnormal CT head.   There was some concern of new hyperdense right subdural collection, without evidence of acute blood products and a concern of acute-subacute infarct in the right parietal lobe. Neurology was consulted.  No new focal deficit.  Patient has baseline less strength on left. --MRI and MRAs done --Neurology consulted, no further recommendation  Diarrhea --likely due to abx and anti-fungal use --No active C diff infection.  GI path neg --Imodium PRN   Objective: Vitals:   01/09/21 0819 01/09/21 1030 01/09/21 1117 01/09/21 1147  BP: 124/76 (!) 130/96 130/84 129/89  Pulse: 98 (!) 102 98 95  Resp: 16 16 16 16   Temp: 99.2 F (37.3 C) 99 F (37.2 C) 98.6 F (37 C) 99.4 F (37.4 C)  TempSrc: Oral Oral Oral Oral  SpO2: 99% 97% 96% 97%  Weight:      Height:        Intake/Output Summary (Last 24  hours) at 01/09/2021 1343 Last data filed at 01/09/2021 1132 Gross per 24 hour  Intake 1135.91 ml  Output 1500 ml  Net -364.09 ml   Filed Weights   01/07/21 0536 01/08/21 0703 01/09/21 0500  Weight: 50.9 kg 50.9 kg 54.2 kg    Examination:  Constitutional: NAD, AAOx3 HEENT: conjunctivae and lids normal, EOMI CV: No cyanosis.   RESP: normal respiratory effort, on RA Extremities: No effusions, edema in BLE SKIN: warm, dry Neuro: II - XII grossly intact.   Psych: depressed mood and affect.  Appropriate judgement and reason   DVT prophylaxis: SCDs, concern of GI bleed Code Status: Full Family Communication:  Status is: inpatient Dispo:   The patient is from: home Anticipated d/c is to: SNF Anticipated d/c date is: undetermined Patient currently is not medically stable to d/c due to: continue to have GI bleed                  All the records  are reviewed and case discussed with Care Management/Social Worker. Management plans discussed with the patient, nursing and they are in agreement.  Consultants:  GI PCCM  Procedures:  Antimicrobials:  Unasyn  Data Reviewed: I have personally reviewed following labs and imaging studies  CBC: Recent Labs  Lab 01/04/21 1205 01/04/21 1458 01/05/21 0540 01/06/21 0415 01/07/21 0455 01/08/21 0514 01/09/21 0438 01/09/21 0802  WBC 30.2*  --  24.2* 18.7* 11.3* 21.4* 22.0*  --   NEUTROABS 27.0*  --   --   --   --   --   --   --   HGB 2.7*   < > 11.8* 11.9* 9.4* 9.7* 5.9* 5.9*  HCT 10.4*   < > 34.7* 36.3 29.4* 30.6* 19.0* 19.4*  MCV 65.0*  --  75.4* 81.4 80.5 83.8 85.6  --   PLT 721*   < > 372 288 226 253 244  --    < > = values in this interval not displayed.   Basic Metabolic Panel: Recent Labs  Lab 01/05/21 0540 01/06/21 0415 01/07/21 0455 01/08/21 0514 01/09/21 0438  NA 138 141 136 136 136  K 4.1 4.6 4.2 3.9 3.6  CL 107 109 104 107 105  CO2 21* 23 24 25 24   GLUCOSE 122* 124* 110* 115* 95  BUN 21* 13 15 13  21*   CREATININE 0.75 0.71 0.62 0.56 0.66  CALCIUM 8.1* 8.6* 8.1* 8.3* 7.8*  MG 2.0  --  2.0 2.0 1.9  PHOS 2.9  --  2.9  --   --    GFR: Estimated Creatinine Clearance: 64.5 mL/min (by C-G formula based on SCr of 0.66 mg/dL). Liver Function Tests: Recent Labs  Lab 01/04/21 1205  AST 20  ALT 13  ALKPHOS 82  BILITOT 0.6  PROT 5.5*  ALBUMIN 2.1*   Recent Labs  Lab 01/04/21 1205  LIPASE 50   No results for input(s): AMMONIA in the last 168 hours. Coagulation Profile: Recent Labs  Lab 01/04/21 1205 01/04/21 1547 01/04/21 1915 01/05/21 0540  INR 1.2 1.2 1.1 1.1   Cardiac Enzymes: Recent Labs  Lab 01/04/21 1205  CKTOTAL 85   BNP (last 3 results) No results for input(s): PROBNP in the last 8760 hours. HbA1C: No results for input(s): HGBA1C in the last 72 hours.  CBG: Recent Labs  Lab 01/04/21 1704  GLUCAP 100*   Lipid Profile: No results for input(s): CHOL, HDL, LDLCALC, TRIG, CHOLHDL, LDLDIRECT in the last 72 hours.  Thyroid Function Tests: No results for input(s): TSH, T4TOTAL, FREET4, T3FREE, THYROIDAB in the last 72 hours. Anemia Panel: No results for input(s): VITAMINB12, FOLATE, FERRITIN, TIBC, IRON, RETICCTPCT in the last 72 hours.  Sepsis Labs: Recent Labs  Lab 01/04/21 1205 01/04/21 1547 01/05/21 0538 01/05/21 0540 01/06/21 0415  PROCALCITON 24.93  --  10.66  --   --   LATICACIDVEN 3.6* 2.5*  --  2.1* 1.2    Recent Results (from the past 240 hour(s))  Resp Panel by RT-PCR (Flu A&B, Covid) Nasopharyngeal Swab     Status: None   Collection Time: 01/04/21 12:05 PM   Specimen: Nasopharyngeal Swab; Nasopharyngeal(NP) swabs in vial transport medium  Result Value Ref Range Status   SARS Coronavirus 2 by RT PCR NEGATIVE NEGATIVE Final    Comment: (NOTE) SARS-CoV-2 target nucleic acids are NOT DETECTED.  The SARS-CoV-2 RNA is generally detectable in upper respiratory specimens during the acute phase of infection. The lowest concentration of  SARS-CoV-2 viral copies this assay can  detect is 138 copies/mL. A negative result does not preclude SARS-Cov-2 infection and should not be used as the sole basis for treatment or other patient management decisions. A negative result may occur with  improper specimen collection/handling, submission of specimen other than nasopharyngeal swab, presence of viral mutation(s) within the areas targeted by this assay, and inadequate number of viral copies(<138 copies/mL). A negative result must be combined with clinical observations, patient history, and epidemiological information. The expected result is Negative.  Fact Sheet for Patients:  BloggerCourse.com  Fact Sheet for Healthcare Providers:  SeriousBroker.it  This test is no t yet approved or cleared by the [place] FDA and  has been authorized for detection and/or diagnosis of SARS-CoV-2 by FDA under an Emergency Use Authorization (EUA). This EUA will remain  in effect (meaning this test can be used) for the duration of the COVID-19 declaration under Section 564(b)(1) of the Act, 21 U.S.C.section 360bbb-3(b)(1), unless the authorization is terminated  or revoked sooner.       Influenza A by PCR NEGATIVE NEGATIVE Final   Influenza B by PCR NEGATIVE NEGATIVE Final    Comment: (NOTE) The Xpert Xpress SARS-CoV-2/FLU/RSV plus assay is intended as an aid in the diagnosis of influenza from Nasopharyngeal swab specimens and should not be used as a sole basis for treatment. Nasal washings and aspirates are unacceptable for Xpert Xpress SARS-CoV-2/FLU/RSV testing.  Fact Sheet for Patients: BloggerCourse.com  Fact Sheet for Healthcare Providers: SeriousBroker.it  This test is not yet approved or cleared by the [place] FDA and has been authorized for detection and/or diagnosis of SARS-CoV-2 by FDA under an Emergency Use  Authorization (EUA). This EUA will remain in effect (meaning this test can be used) for the duration of the COVID-19 declaration under Section 564(b)(1) of the Act, 21 U.S.C. section 360bbb-3(b)(1), unless the authorization is terminated or revoked.  Performed at [facility], [address] [place], [place] [address]   Blood culture (routine single)     Status: Abnormal (Preliminary result)   Collection Time: 01/04/21  2:58 PM   Specimen: BLOOD  Result Value Ref Range Status   Specimen Description   Final    BLOOD LEFT ANTECUBITAL Performed at [facility], [address], [place], [place] [address]    Special Requests   Final    BOTTLES DRAWN AEROBIC AND ANAEROBIC Blood Culture adequate volume Performed at [facility], [address] [place], [place] [address]    Culture  Setup Time   Final    YEAST ANAEROBIC BOTTLE ONLY Organism ID to follow CRITICAL RESULT CALLED TO, READ BACK BY AND VERIFIED WITH: SAMANTHA RAUER 01/05/21 1507 SLM IN BOTH AEROBIC AND ANAEROBIC BOTTLES Performed at [facility], [address] [place], [place] [address]    Culture (A)  Final    CANDIDA KRUSEI Sent to [facility] for further susceptibility testing. Performed at [facility], [address], [place], [place] [address]    Report Status PENDING  Incomplete  Blood Culture ID Panel (Reflexed)     Status: Abnormal   Collection Time: 01/04/21  2:58 PM  Result Value Ref Range Status   Enterococcus faecalis NOT DETECTED NOT DETECTED Final   Enterococcus Faecium NOT DETECTED NOT DETECTED Final   Listeria monocytogenes NOT DETECTED NOT DETECTED Final   Staphylococcus species NOT DETECTED NOT DETECTED Final   Staphylococcus aureus (BCID) NOT DETECTED NOT DETECTED Final   Staphylococcus epidermidis NOT DETECTED NOT DETECTED Final   Staphylococcus  lugdunensis NOT DETECTED NOT DETECTED Final   Streptococcus species NOT DETECTED NOT DETECTED Final    Streptococcus agalactiae NOT DETECTED NOT DETECTED Final   Streptococcus pneumoniae NOT DETECTED NOT DETECTED Final   Streptococcus pyogenes NOT DETECTED NOT DETECTED Final   A.calcoaceticus-baumannii NOT DETECTED NOT DETECTED Final   Bacteroides fragilis NOT DETECTED NOT DETECTED Final   Enterobacterales NOT DETECTED NOT DETECTED Final   Enterobacter cloacae complex NOT DETECTED NOT DETECTED Final   Escherichia coli NOT DETECTED NOT DETECTED Final   Klebsiella aerogenes NOT DETECTED NOT DETECTED Final   Klebsiella oxytoca NOT DETECTED NOT DETECTED Final   Klebsiella pneumoniae NOT DETECTED NOT DETECTED Final   Proteus species NOT DETECTED NOT DETECTED Final   Salmonella species NOT DETECTED NOT DETECTED Final   Serratia marcescens NOT DETECTED NOT DETECTED Final   Haemophilus influenzae NOT DETECTED NOT DETECTED Final   Neisseria meningitidis NOT DETECTED NOT DETECTED Final   Pseudomonas aeruginosa NOT DETECTED NOT DETECTED Final   Stenotrophomonas maltophilia NOT DETECTED NOT DETECTED Final   Candida albicans NOT DETECTED NOT DETECTED Final   Candida auris NOT DETECTED NOT DETECTED Final   Candida glabrata NOT DETECTED NOT DETECTED Final   Candida krusei DETECTED (A) NOT DETECTED Final    Comment: CRITICAL RESULT CALLED TO, READ BACK BY AND VERIFIED WITH: SAMANTHA RAUER 01/05/21 1507 SLM    Candida parapsilosis NOT DETECTED NOT DETECTED Final   Candida tropicalis NOT DETECTED NOT DETECTED Final   Cryptococcus neoformans/gattii NOT DETECTED NOT DETECTED Final    Comment: Performed at [facility], [address] [place], [place] [address]  MRSA Next Gen by PCR, Nasal     Status: None   Collection Time: 01/04/21  4:58 PM   Specimen: Nasal Mucosa; Nasal Swab  Result Value Ref Range Status   MRSA by PCR Next Gen NOT DETECTED NOT DETECTED Final    Comment: (NOTE) The GeneXpert MRSA Assay (FDA approved for NASAL specimens only), is one component of a comprehensive MRSA  colonization surveillance program. It is not intended to diagnose MRSA infection nor to guide or monitor treatment for MRSA infections. Test performance is not FDA approved in patients less than 25 years old. Performed at [facility], [address], [place], [place] [address]   Urine Culture     Status: Abnormal   Collection Time: 01/04/21  8:50 PM   Specimen: Urine, Clean Catch  Result Value Ref Range Status   Specimen Description   Final    URINE, CLEAN CATCH Performed at [facility], [address], [place], [place] [address]    Special Requests   Final    NONE Performed at [facility], [address] [place], [place] [address]    Culture (A)  Final    <10,000 COLONIES/mL INSIGNIFICANT GROWTH Performed at [facility], [address] [place], [place] [address]    Report Status 01/07/2021 FINAL  Final  Gastrointestinal Panel by PCR , Stool     Status: None   Collection Time: 01/05/21  3:01 PM   Specimen: Stool  Result Value Ref Range Status   Campylobacter species NOT DETECTED NOT DETECTED Final   Plesimonas shigelloides NOT DETECTED NOT DETECTED Final   Salmonella species NOT DETECTED NOT DETECTED Final   Yersinia enterocolitica NOT DETECTED NOT DETECTED Final   Vibrio species NOT DETECTED NOT DETECTED Final   Vibrio cholerae NOT DETECTED NOT DETECTED Final   Enteroaggregative E coli (EAEC) NOT DETECTED NOT DETECTED  Final   Enteropathogenic E coli (EPEC) NOT DETECTED NOT DETECTED Final   Enterotoxigenic E coli (ETEC) NOT DETECTED NOT DETECTED Final   Shiga like toxin producing E coli (STEC) NOT DETECTED NOT DETECTED Final   Shigella/Enteroinvasive E coli (EIEC) NOT DETECTED NOT DETECTED Final   Cryptosporidium NOT DETECTED NOT DETECTED Final   Cyclospora cayetanensis NOT DETECTED NOT DETECTED Final   Entamoeba histolytica NOT DETECTED NOT DETECTED Final   Giardia lamblia NOT DETECTED NOT DETECTED Final   Adenovirus F40/41 NOT  DETECTED NOT DETECTED Final   Astrovirus NOT DETECTED NOT DETECTED Final   Norovirus GI/GII NOT DETECTED NOT DETECTED Final   Rotavirus A NOT DETECTED NOT DETECTED Final   Sapovirus (I, II, IV, and V) NOT DETECTED NOT DETECTED Final    Comment: Performed at [facility], [address] [place], [place] [address]  C Difficile Quick Screen w PCR reflex     Status: Abnormal   Collection Time: 01/05/21  3:01 PM   Specimen: STOOL  Result Value Ref Range Status   C Diff antigen POSITIVE (A) NEGATIVE Final   C Diff toxin NEGATIVE NEGATIVE Final   C Diff interpretation Results are indeterminate. See PCR results.  Final    Comment: Performed at [facility], [address] [place], [place] [address]  C. Diff by PCR, Reflexed     Status: None   Collection Time: 01/05/21  3:01 PM  Result Value Ref Range Status   Toxigenic C. Difficile by PCR NEGATIVE NEGATIVE Final    Comment: Patient is colonized with non toxigenic C. difficile. May not need treatment unless significant symptoms are present. Performed at [facility], [address] [place], [place] [address]   Culture, blood (single)     Status: None (Preliminary result)   Collection Time: 01/05/21  4:42 PM   Specimen: BLOOD  Result Value Ref Range Status   Specimen Description BLOOD BLOOD LEFT HAND  Final   Special Requests   Final    BOTTLES DRAWN AEROBIC AND ANAEROBIC Blood Culture adequate volume   Culture   Final    NO GROWTH 4 DAYS Performed at [facility], [address] [place], [place] [address]    Report Status PENDING  Incomplete  CSF culture w Gram Stain     Status: None (Preliminary result)   Collection Time: 01/07/21  2:43 PM   Specimen: PATH Cytology CSF; Cerebrospinal Fluid  Result Value Ref Range Status   Specimen Description   Final    CSF Performed at [facility], [address] [place], [place] [address]    Special Requests   Final    NONE Performed at  [facility], [address] [place], [place] [address]    Gram Stain   Final    NO ORGANISMS SEEN WBC PRESENT, PREDOMINANTLY MONONUCLEAR CRITICAL RESULT CALLED TO, READ BACK BY AND VERIFIED WITH: Shanda Bumps, RN AT [phone] 01/07/21 BY JRH    Culture   Final    NO GROWTH 2 DAYS Performed at [facility], [address], [place], [place] [address]    Report Status PENDING  Incomplete     Radiology Studies: ECHO TEE  Result Date: 01/08/2021    TRANSESOPHOGEAL ECHO REPORT   Patient Name:   HALLELUJAH WYSONG Date of Exam: 01/08/2021 Medical Rec #:  [id number]        Height:       60.0 in Accession #:  [id number]       Weight:       112.2 lb Date of Birth:  [birth year]       BSA:          1.460 m Patient Age:    44 years         BP:           134/95 mmHg Patient Gender: F                HR:           84 bpm. Exam Location:  [facility] Procedure: Transesophageal Echo, Cardiac Doppler, Color Doppler and Saline            Contrast Bubble Study Indications:     Fever R50.9  History:         Patient has prior history of Echocardiogram examinations, most                  recent 01/05/2021. Risk Factors:Hypertension.  Sonographer:     Cristela Blue Referring Phys:  [id number] CADENCE H FURTH Diagnosing Phys: Julien Nordmann MD PROCEDURE: After discussion of the risks and benefits of a TEE, an informed consent was obtained from the patient. The transesophogeal probe was passed without difficulty through the esophogus of the patient. Local oropharyngeal anesthetic was provided with Benzocaine spray and Cetacaine. Sedation performed by different physician. Image quality was excellent. The patient's vital signs; including heart rate, blood pressure, and oxygen saturation; remained stable throughout the procedure. The patient developed no complications during the procedure. IMPRESSIONS  1. No valve vegetation noted/no endocarditis  2. Left ventricular ejection fraction, by estimation, is 60 to 65%. The left  ventricle has normal function. The left ventricle has no regional wall motion abnormalities.  3. Right ventricular systolic function is normal. The right ventricular size is normal.  4. The mitral valve is normal in structure. Mild to moderate mitral valve regurgitation. No evidence of mitral stenosis. Possible mild prolapse of the posterior leaflet, P2 scallop  5. The aortic valve is normal in structure. Aortic valve regurgitation is not visualized. No aortic stenosis is present.  6. The inferior vena cava is normal in size with greater than 50% respiratory variability, suggesting right atrial pressure of 3 mmHg.  7. Agitated saline contrast bubble study was negative, with no evidence of any interatrial shunt.  8. No left atrial/left atrial appendage thrombus was detected. Conclusion(s)/Recommendation(s): Normal biventricular function without evidence of hemodynamically significant valvular heart disease. FINDINGS  Left Ventricle: Left ventricular ejection fraction, by estimation, is 60 to 65%. The left ventricle has normal function. The left ventricle has no regional wall motion abnormalities. The left ventricular internal cavity size was normal in size. There is  no left ventricular hypertrophy. Right Ventricle: The right ventricular size is normal. No increase in right ventricular wall thickness. Right ventricular systolic function is normal. Left Atrium: Left atrial size was normal in size. No left atrial/left atrial appendage thrombus was detected. Right Atrium: Right atrial size was normal in size. Pericardium: There is no evidence of pericardial effusion. Mitral Valve: The mitral valve is normal in structure. Mild to moderate mitral valve regurgitation. No evidence of mitral valve stenosis. Tricuspid Valve: The tricuspid valve is normal in structure. Tricuspid valve regurgitation is mild . No evidence of tricuspid stenosis. Aortic Valve: The aortic valve is normal in structure. Aortic valve regurgitation is  not visualized. No aortic stenosis is present. Pulmonic Valve: The pulmonic valve was normal in structure. Pulmonic  valve regurgitation is mild. No evidence of pulmonic stenosis. Aorta: The aortic root is normal in size and structure. There is minimal (Grade I) atheroma plaque involving the descending aorta and transverse aorta. Venous: The inferior vena cava is normal in size with greater than 50% respiratory variability, suggesting right atrial pressure of 3 mmHg. IAS/Shunts: No atrial level shunt detected by color flow Doppler. Agitated saline contrast was given intravenously to evaluate for intracardiac shunting. Agitated saline contrast bubble study was negative, with no evidence of any interatrial shunt. Julien Nordmann MD Electronically signed by Julien Nordmann MD Signature Date/Time: 01/08/2021/6:02:57 PM    Final    DG FL GUIDED LUMBAR PUNCTURE  Result Date: 01/07/2021 CLINICAL DATA:  Patient with sepsis and a VP shunt request received for lumbar puncture. EXAM: DIAGNOSTIC LUMBAR PUNCTURE UNDER FLUOROSCOPIC GUIDANCE COMPARISON:  MR brain imaging performed 01/06/2021 report reviewed prior to the procedure. FLUOROSCOPY TIME:  Fluoroscopy Time:  0.2 minute Radiation Exposure Index (if provided by the fluoroscopic device): 3.0 mGy Number of Acquired Spot Images: 2 PROCEDURE: Informed consent was obtained from the patient prior to the procedure, including potential complications of infection, bleeding, CSF leak requiring additional procedures, paresthesia, headache, allergy, and pain. With the patient prone, the lower back was prepped with Betadine. 1% Lidocaine was used for local anesthesia. Lumbar puncture was performed at the L3-L4 level using a 20 gauge needle with return of colorless CSF with an opening pressure of 20 cm water. Eight ml of CSF were obtained for laboratory studies. The patient tolerated the procedure well and there were no apparent complications. A sterile bandage was applied.  IMPRESSION: Technically successful fluoroscopic guided lumbar puncture; opening pressure of 20 cm water. Procedure performed by Pattricia Boss PA-C, supervised by Lesia Hausen. Electronically Signed   By: Lesia Hausen M.D.   On: 01/07/2021 14:55    Scheduled Meds:  budesonide (PULMICORT) nebulizer solution  0.5 mg Nebulization BID   pantoprazole  40 mg Intravenous Q12H   [START ON 01/11/2021] predniSONE  20 mg Oral Q breakfast   Followed by   Melene Muller ON 01/13/2021] predniSONE  10 mg Oral Q breakfast   predniSONE  40 mg Oral Q breakfast   Continuous Infusions:  sodium chloride     anidulafungin Stopped (01/08/21 1634)     LOS: 5 days    Darlin Priestly, MD [facility]  If 7PM-7AM, please contact night-coverage [email or web address]  01/09/2021, 1:43 PM

## 2021-01-09 NOTE — Progress Notes (Signed)
[facility] Inpatient Progress Note  Subjective: Patient seen for follow up with attending MD messaging me for recurrent Gastrointestinal bleeding. Patient reportedly had very large melenic stool early this AM with concurrent drop in Hgb to 5.9 today from 9.7  obtained yesterday. Patient seems very somnolent but is oriented x 3 and is aware of her medical issues leading to her hospitalization.  Objective: Vital signs in last 24 hours: Temp:  [97.9 F (36.6 C)-99.7 F (37.6 C)] 98.9 F (37.2 C) (10/15 1436) Pulse Rate:  [86-102] 94 (10/15 1436) Resp:  [16-18] 16 (10/15 1436) BP: (87-147)/(72-101) 147/97 (10/15 1436) SpO2:  [95 %-99 %] 96 % (10/15 1436) Weight:  [54.2 kg] 54.2 kg (10/15 0500) Blood pressure (!) 147/97, pulse 94, temperature 98.9 F (37.2 C), temperature source Oral, resp. rate 16, height 5' (1.524 m), weight 54.2 kg, SpO2 96 %.    Intake/Output from previous day: 10/14 0701 - 10/15 0700 In: 1622.7 [P.O.:360; I.V.:500; IV Piggyback:762.7] Out: 1100 [Urine:1100]  Intake/Output this shift: Total I/O In: 307.2 [I.V.:13.2; Blood:294] Out: 800 [Urine:800]   Gen: Somnolent. NAD. Appears pale and overall medically ill.  HEENT: [place]/AT. PERRLA. Normal external ear exam.  Chest: CTA, no wheezes.  CV: RR nl S1, S2. No gallops.  Abd: soft, nt, nd. BS+  Ext: no edema. Pulses 2+  Neuro: Alert and oriented. Judgement appears normal. Nonfocal.   Lab Results: Results for orders placed or performed during the hospital encounter of 01/04/21 (from the past 24 hour(s))  CBC     Status: Abnormal   Collection Time: 01/09/21  4:38 AM  Result Value Ref Range   WBC 22.0 (H) 4.0 - 10.5 K/uL   RBC 2.22 (L) 3.87 - 5.11 MIL/uL   Hemoglobin 5.9 (L) 12.0 - 15.0 g/dL   HCT 19.0 (L) 36.0 - 46.0 %   MCV 85.6 80.0 - 100.0 fL   MCH 26.6 26.0 - 34.0 pg   MCHC 31.1 30.0 - 36.0 g/dL   RDW 25.4 (H) 11.5 - 15.5 %   Platelets 244 150 - 400 K/uL   nRBC 0.3 (H) 0.0 -  0.2 %  Basic metabolic panel     Status: Abnormal   Collection Time: 01/09/21  4:38 AM  Result Value Ref Range   Sodium 136 135 - 145 mmol/L   Potassium 3.6 3.5 - 5.1 mmol/L   Chloride 105 98 - 111 mmol/L   CO2 24 22 - 32 mmol/L   Glucose, Bld 95 70 - 99 mg/dL   BUN 21 (H) 6 - 20 mg/dL   Creatinine, Ser 0.66 0.44 - 1.00 mg/dL   Calcium 7.8 (L) 8.9 - 10.3 mg/dL   GFR, Estimated >60 >60 mL/min   Anion gap 7 5 - 15  Magnesium     Status: None   Collection Time: 01/09/21  4:38 AM  Result Value Ref Range   Magnesium 1.9 1.7 - 2.4 mg/dL  Type and screen [facility]     Status: None (Preliminary result)   Collection Time: 01/09/21  8:02 AM  Result Value Ref Range   ABO/RH(D) O NEG    Antibody Screen NEG    Sample Expiration 01/12/2021,2359    Unit Number [id number]    Blood Component Type RED CELLS,LR    Unit division 00    Status of Unit ISSUED    Transfusion Status OK TO TRANSFUSE    Crossmatch Result COMPATIBLE    Unit Number [id number]    Blood Component Type   RBC LR PHER1    Unit division 00    Status of Unit ISSUED    Transfusion Status OK TO TRANSFUSE    Crossmatch Result      COMPATIBLE Performed at [facility], [address] [place], [place] [address]   Prepare RBC (crossmatch)     Status: None   Collection Time: 01/09/21  8:02 AM  Result Value Ref Range   Order Confirmation      ORDER PROCESSED BY BLOOD BANK Performed at [facility], [address] [place], [place] [address]   Hemoglobin and hematocrit, blood     Status: Abnormal   Collection Time: 01/09/21  8:02 AM  Result Value Ref Range   Hemoglobin 5.9 (L) 12.0 - 15.0 g/dL   HCT 09.8 (L) 11.9 - 14.7 %     Recent Labs    01/07/21 0455 01/08/21 0514 01/09/21 0438 01/09/21 0802  WBC 11.3* 21.4* 22.0*  --   HGB 9.4* 9.7* 5.9* 5.9*  HCT 29.4* 30.6* 19.0* 19.4*  PLT 226 253 244  --    BMET Recent Labs    01/07/21 0455 01/08/21 0514 01/09/21 0438   NA 136 136 136  K 4.2 3.9 3.6  CL 104 107 105  CO2 24 25 24   GLUCOSE 110* 115* 95  BUN 15 13 21*  CREATININE 0.62 0.56 0.66  CALCIUM 8.1* 8.3* 7.8*   LFT No results for input(s): PROT, ALBUMIN, AST, ALT, ALKPHOS, BILITOT, BILIDIR, IBILI in the last 72 hours. PT/INR No results for input(s): LABPROT, INR in the last 72 hours. Hepatitis Panel No results for input(s): HEPBSAG, HCVAB, HEPAIGM, HEPBIGM in the last 72 hours. C-Diff No results for input(s): CDIFFTOX in the last 72 hours. No results for input(s): CDIFFPCR in the last 72 hours.   Studies/Results: ECHO TEE  Result Date: 01/08/2021    TRANSESOPHOGEAL ECHO REPORT   Patient Name:   Carolyn Herring Date of Exam: 01/08/2021 Medical Rec #:  01/10/2021        Height:       60.0 in Accession #:    [id number]       Weight:       112.2 lb Date of Birth:  [birth year]       BSA:          1.460 m Patient Age:    44 years         BP:           134/95 mmHg Patient Gender: F                HR:           84 bpm. Exam Location:  [facility] Procedure: Transesophageal Echo, Cardiac Doppler, Color Doppler and Saline            Contrast Bubble Study Indications:     Fever R50.9  History:         Patient has prior history of Echocardiogram examinations, most                  recent 01/05/2021. Risk Factors:Hypertension.  Sonographer:     03/07/2021 Referring Phys:  Cristela Blue CADENCE H FURTH Diagnosing Phys: [id number] MD PROCEDURE: After discussion of the risks and benefits of a TEE, an informed consent was obtained from the patient. The transesophogeal probe was passed without difficulty through the esophogus of the patient. Local oropharyngeal anesthetic was provided with Benzocaine spray and Cetacaine. Sedation performed by different physician.  Image quality was excellent. The patient's vital signs; including heart rate, blood pressure, and oxygen saturation; remained stable throughout the procedure. The patient developed no complications during the  procedure. IMPRESSIONS  1. No valve vegetation noted/no endocarditis  2. Left ventricular ejection fraction, by estimation, is 60 to 65%. The left ventricle has normal function. The left ventricle has no regional wall motion abnormalities.  3. Right ventricular systolic function is normal. The right ventricular size is normal.  4. The mitral valve is normal in structure. Mild to moderate mitral valve regurgitation. No evidence of mitral stenosis. Possible mild prolapse of the posterior leaflet, P2 scallop  5. The aortic valve is normal in structure. Aortic valve regurgitation is not visualized. No aortic stenosis is present.  6. The inferior vena cava is normal in size with greater than 50% respiratory variability, suggesting right atrial pressure of 3 mmHg.  7. Agitated saline contrast bubble study was negative, with no evidence of any interatrial shunt.  8. No left atrial/left atrial appendage thrombus was detected. Conclusion(s)/Recommendation(s): Normal biventricular function without evidence of hemodynamically significant valvular heart disease. FINDINGS  Left Ventricle: Left ventricular ejection fraction, by estimation, is 60 to 65%. The left ventricle has normal function. The left ventricle has no regional wall motion abnormalities. The left ventricular internal cavity size was normal in size. There is  no left ventricular hypertrophy. Right Ventricle: The right ventricular size is normal. No increase in right ventricular wall thickness. Right ventricular systolic function is normal. Left Atrium: Left atrial size was normal in size. No left atrial/left atrial appendage thrombus was detected. Right Atrium: Right atrial size was normal in size. Pericardium: There is no evidence of pericardial effusion. Mitral Valve: The mitral valve is normal in structure. Mild to moderate mitral valve regurgitation. No evidence of mitral valve stenosis. Tricuspid Valve: The tricuspid valve is normal in structure. Tricuspid  valve regurgitation is mild . No evidence of tricuspid stenosis. Aortic Valve: The aortic valve is normal in structure. Aortic valve regurgitation is not visualized. No aortic stenosis is present. Pulmonic Valve: The pulmonic valve was normal in structure. Pulmonic valve regurgitation is mild. No evidence of pulmonic stenosis. Aorta: The aortic root is normal in size and structure. There is minimal (Grade I) atheroma plaque involving the descending aorta and transverse aorta. Venous: The inferior vena cava is normal in size with greater than 50% respiratory variability, suggesting right atrial pressure of 3 mmHg. IAS/Shunts: No atrial level shunt detected by color flow Doppler. Agitated saline contrast was given intravenously to evaluate for intracardiac shunting. Agitated saline contrast bubble study was negative, with no evidence of any interatrial shunt. Julien Nordmann MD Electronically signed by Julien Nordmann MD Signature Date/Time: 01/08/2021/6:02:57 PM    Final     Scheduled Inpatient Medications:    budesonide (PULMICORT) nebulizer solution  0.5 mg Nebulization BID   pantoprazole  40 mg Intravenous Q12H   [START ON 01/11/2021] predniSONE  20 mg Oral Q breakfast   Followed by   Melene Muller ON 01/13/2021] predniSONE  10 mg Oral Q breakfast   predniSONE  40 mg Oral Q breakfast    Continuous Inpatient Infusions:    sodium chloride     anidulafungin Stopped (01/08/21 1634)    PRN Inpatient Medications:  acetaminophen, hydrALAZINE, ipratropium-albuterol, loperamide, ondansetron (ZOFRAN) IV, traMADol  Miscellaneous: N/A  Assessment:  Large Gastric Ulcer s/p EGD on 01/05/21 by Dr. Mia Creek. Reportedly a large adherent clot that did not wash off. Appears to have re-bled at this time.  Acute posthemorrhagic anemia. Secondary to #1 above. Spina Bifida - with exacerbation of pain in back, weakness and recurrent falls at home. 4.   Hypotension presumably from GI bleeding, but also possibly from  candidemia sepsis.  Plan:  Clear liquid diet. NPO after MN. Will switch to IV protonix gtt from BID dosing. 4.   Transfuse to Hgb > 7. 5. EGD in AM. The patient understands the nature of the planned procedure, indications, risks, alternatives and potential complications including but not limited to bleeding, infection, perforation, damage to internal organs and possible oversedation/side effects from anesthesia. The patient agrees and gives consent to proceed.  6. Will follow along closely.   Jazmarie Biever K. Norma Fredrickson, M.D. 01/09/2021, 3:01 PM

## 2021-01-10 ENCOUNTER — Inpatient Hospital Stay: Payer: Medicaid Other

## 2021-01-10 ENCOUNTER — Encounter: Payer: Self-pay | Admitting: Internal Medicine

## 2021-01-10 ENCOUNTER — Encounter
Admission: EM | Disposition: A | Discharge status: Skilled Nursing Facility | Payer: Self-pay | Source: Home / Self Care | Attending: Internal Medicine

## 2021-01-10 ENCOUNTER — Inpatient Hospital Stay: Payer: Medicaid Other | Admitting: Anesthesiology

## 2021-01-10 DIAGNOSIS — R578 Other shock: Secondary | ICD-10-CM | POA: Diagnosis not present

## 2021-01-10 DIAGNOSIS — J9602 Acute respiratory failure with hypercapnia: Secondary | ICD-10-CM

## 2021-01-10 DIAGNOSIS — J9601 Acute respiratory failure with hypoxia: Secondary | ICD-10-CM

## 2021-01-10 DIAGNOSIS — R4182 Altered mental status, unspecified: Secondary | ICD-10-CM

## 2021-01-10 DIAGNOSIS — K254 Chronic or unspecified gastric ulcer with hemorrhage: Secondary | ICD-10-CM | POA: Diagnosis not present

## 2021-01-10 HISTORY — PX: ESOPHAGOGASTRODUODENOSCOPY: SHX5428

## 2021-01-10 LAB — BLOOD GAS, ARTERIAL
Acid-base deficit: 20.6 mmol/L — ABNORMAL HIGH (ref 0.0–2.0)
Acid-base deficit: 7.3 mmol/L — ABNORMAL HIGH (ref 0.0–2.0)
Bicarbonate: 12.1 mmol/L — ABNORMAL LOW (ref 20.0–28.0)
Bicarbonate: 18.8 mmol/L — ABNORMAL LOW (ref 20.0–28.0)
FIO2: 100
FIO2: 1
MECHVT: 250 mL
MECHVT: 300 mL
Mechanical Rate: 13
Mechanical Rate: 30
O2 Saturation: 97 %
O2 Saturation: 99.6 %
PEEP: 4 cmH2O
PEEP: 5 cmH2O
Patient temperature: 37
Patient temperature: 37
RATE: 13 resp/min
pCO2 arterial: 63 mmHg — ABNORMAL HIGH (ref 32.0–48.0)
pCO2 arterial: 39 mmHg (ref 32.0–48.0)
pH, Arterial: <6.9 — CL (ref 7.350–7.450)
pH, Arterial: 7.29 — ABNORMAL LOW (ref 7.350–7.450)
pO2, Arterial: 144 mmHg — ABNORMAL HIGH (ref 83.0–108.0)
pO2, Arterial: 189 mmHg — ABNORMAL HIGH (ref 83.0–108.0)

## 2021-01-10 LAB — CBC WITH DIFFERENTIAL/PLATELET
Abs Immature Granulocytes: 4.52 10*3/uL — ABNORMAL HIGH (ref 0.00–0.07)
Basophils Absolute: 0.1 10*3/uL (ref 0.0–0.1)
Basophils Relative: 0 %
Eosinophils Absolute: 0.2 10*3/uL (ref 0.0–0.5)
Eosinophils Relative: 1 %
HCT: 31 % — ABNORMAL LOW (ref 36.0–46.0)
Hemoglobin: 10.5 g/dL — ABNORMAL LOW (ref 12.0–15.0)
Immature Granulocytes: 12 %
Lymphocytes Relative: 16 %
Lymphs Abs: 6.2 10*3/uL — ABNORMAL HIGH (ref 0.7–4.0)
MCH: 31 pg (ref 26.0–34.0)
MCHC: 33.9 g/dL (ref 30.0–36.0)
MCV: 91.4 fL (ref 80.0–100.0)
Monocytes Absolute: 1.1 10*3/uL — ABNORMAL HIGH (ref 0.1–1.0)
Monocytes Relative: 3 %
Neutro Abs: 26.4 10*3/uL — ABNORMAL HIGH (ref 1.7–7.7)
Neutrophils Relative %: 68 %
Platelets: 84 10*3/uL — ABNORMAL LOW (ref 150–400)
RBC: 3.39 MIL/uL — ABNORMAL LOW (ref 3.87–5.11)
RDW: 15.8 % — ABNORMAL HIGH (ref 11.5–15.5)
Smear Review: NORMAL
WBC Morphology: INCREASED
WBC: 38.5 10*3/uL — ABNORMAL HIGH (ref 4.0–10.5)
nRBC: 2.4 % — ABNORMAL HIGH (ref 0.0–0.2)

## 2021-01-10 LAB — GLUCOSE, CAPILLARY
Glucose-Capillary: 95 mg/dL (ref 70–99)
Glucose-Capillary: 293 mg/dL — ABNORMAL HIGH (ref 70–99)

## 2021-01-10 LAB — BASIC METABOLIC PANEL
Anion gap: 7 (ref 5–15)
Anion gap: 13 (ref 5–15)
BUN: 22 mg/dL — ABNORMAL HIGH (ref 6–20)
BUN: 19 mg/dL (ref 6–20)
CO2: 21 mmol/L — ABNORMAL LOW (ref 22–32)
CO2: 16 mmol/L — ABNORMAL LOW (ref 22–32)
Calcium: 7.6 mg/dL — ABNORMAL LOW (ref 8.9–10.3)
Calcium: 6.6 mg/dL — ABNORMAL LOW (ref 8.9–10.3)
Chloride: 108 mmol/L (ref 98–111)
Chloride: 109 mmol/L (ref 98–111)
Creatinine, Ser: 0.52 mg/dL (ref 0.44–1.00)
Creatinine, Ser: 0.89 mg/dL (ref 0.44–1.00)
GFR, Estimated: >60 mL/min (ref >60)
GFR, Estimated: >60 mL/min (ref >60)
Glucose, Bld: 96 mg/dL (ref 70–99)
Glucose, Bld: 341 mg/dL — ABNORMAL HIGH (ref 70–99)
Potassium: 3.6 mmol/L (ref 3.5–5.1)
Potassium: 4.1 mmol/L (ref 3.5–5.1)
Sodium: 136 mmol/L (ref 135–145)
Sodium: 138 mmol/L (ref 135–145)

## 2021-01-10 LAB — CSF CULTURE W GRAM STAIN
Culture: NO GROWTH
Gram Stain: NONE SEEN

## 2021-01-10 LAB — CULTURE, BLOOD (SINGLE)
Culture: NO GROWTH
Special Requests: ADEQUATE

## 2021-01-10 LAB — CBC
HCT: 29.4 % — ABNORMAL LOW (ref 36.0–46.0)
Hemoglobin: 9.8 g/dL — ABNORMAL LOW (ref 12.0–15.0)
MCH: 28.5 pg (ref 26.0–34.0)
MCHC: 33.3 g/dL (ref 30.0–36.0)
MCV: 85.5 fL (ref 80.0–100.0)
Platelets: 150 10*3/uL (ref 150–400)
RBC: 3.44 MIL/uL — ABNORMAL LOW (ref 3.87–5.11)
RDW: 19.9 % — ABNORMAL HIGH (ref 11.5–15.5)
WBC: 33.7 10*3/uL — ABNORMAL HIGH (ref 4.0–10.5)
nRBC: 0.5 % — ABNORMAL HIGH (ref 0.0–0.2)

## 2021-01-10 LAB — HEMOGLOBIN AND HEMATOCRIT, BLOOD
HCT: 31.3 % — ABNORMAL LOW (ref 36.0–46.0)
HCT: 40.3 % (ref 36.0–46.0)
HCT: 39.8 % (ref 36.0–46.0)
Hemoglobin: 10.3 g/dL — ABNORMAL LOW (ref 12.0–15.0)
Hemoglobin: 14.3 g/dL (ref 12.0–15.0)
Hemoglobin: 14.5 g/dL (ref 12.0–15.0)

## 2021-01-10 LAB — DIC (DISSEMINATED INTRAVASCULAR COAGULATION)PANEL
D-Dimer, Quant: >20 ug/mL-FEU — ABNORMAL HIGH (ref 0.00–0.50)
Fibrinogen: 262 mg/dL (ref 210–475)
INR: 1.9 — ABNORMAL HIGH (ref 0.8–1.2)
Platelets: 84 10*3/uL — ABNORMAL LOW (ref 150–400)
Prothrombin Time: 22.1 seconds — ABNORMAL HIGH (ref 11.4–15.2)
Smear Review: NONE SEEN
aPTT: 57 seconds — ABNORMAL HIGH (ref 24–36)

## 2021-01-10 LAB — PREPARE RBC (CROSSMATCH)

## 2021-01-10 LAB — MAGNESIUM: Magnesium: 1.7 mg/dL (ref 1.7–2.4)

## 2021-01-10 SURGERY — EGD (ESOPHAGOGASTRODUODENOSCOPY)
Anesthesia: General

## 2021-01-10 MED ORDER — CHLORHEXIDINE GLUCONATE 0.12% ORAL RINSE (MEDLINE KIT)
15.0000 mL | Freq: Two times a day (BID) | OROMUCOSAL | Status: DC
  Administered 2021-01-10 – 2021-01-14 (×8): 15 mL via OROMUCOSAL

## 2021-01-10 MED ORDER — MIDAZOLAM HCL 2 MG/2ML IJ SOLN
INTRAMUSCULAR | Status: DC | PRN
  Administered 2021-01-10: 2 mg via INTRAVENOUS

## 2021-01-10 MED ORDER — LIDOCAINE HCL (CARDIAC) PF 100 MG/5ML IV SOSY
PREFILLED_SYRINGE | INTRAVENOUS | Status: DC | PRN
  Administered 2021-01-10: 30 mg via INTRAVENOUS

## 2021-01-10 MED ORDER — IPRATROPIUM-ALBUTEROL 0.5-2.5 (3) MG/3ML IN SOLN
3.0000 mL | Freq: Four times a day (QID) | RESPIRATORY_TRACT | Status: DC
  Administered 2021-01-10 – 2021-01-14 (×16): 3 mL via RESPIRATORY_TRACT
  Filled 2021-01-10 (×16): qty 3

## 2021-01-10 MED ORDER — ORAL CARE MOUTH RINSE
15.0000 mL | OROMUCOSAL | Status: DC
  Administered 2021-01-10 – 2021-01-16 (×42): 15 mL via OROMUCOSAL

## 2021-01-10 MED ORDER — SODIUM BICARBONATE 8.4 % IV SOLN
INTRAVENOUS | Status: AC
  Administered 2021-01-10: 50 meq via INTRAVENOUS
  Filled 2021-01-10: qty 50

## 2021-01-10 MED ORDER — SODIUM CHLORIDE 0.9 % IV SOLN
3.0000 g | Freq: Four times a day (QID) | INTRAVENOUS | Status: DC
  Administered 2021-01-10 – 2021-01-16 (×25): 3 g via INTRAVENOUS
  Filled 2021-01-10 (×3): qty 8
  Filled 2021-01-10: qty 3
  Filled 2021-01-10 (×15): qty 8
  Filled 2021-01-10: qty 3
  Filled 2021-01-10 (×2): qty 8
  Filled 2021-01-10: qty 3
  Filled 2021-01-10 (×6): qty 8
  Filled 2021-01-10: qty 3

## 2021-01-10 MED ORDER — PHENYLEPHRINE HCL (PRESSORS) 10 MG/ML IV SOLN
INTRAVENOUS | Status: AC
  Filled 2021-01-10: qty 1

## 2021-01-10 MED ORDER — PHENYLEPHRINE HCL (PRESSORS) 10 MG/ML IV SOLN
INTRAVENOUS | Status: DC | PRN
  Administered 2021-01-10: 100 ug via INTRAVENOUS
  Administered 2021-01-10: 50 ug via INTRAVENOUS
  Administered 2021-01-10: 100 ug via INTRAVENOUS

## 2021-01-10 MED ORDER — SODIUM BICARBONATE 8.4 % IV SOLN: 50.0000 meq | Freq: Once | INTRAVENOUS | Status: AC

## 2021-01-10 MED ORDER — PROPOFOL 500 MG/50ML IV EMUL
INTRAVENOUS | Status: DC | PRN
  Administered 2021-01-10: 100 ug/kg/min via INTRAVENOUS

## 2021-01-10 MED ORDER — SODIUM CHLORIDE 0.9 % IV SOLN: INTRAVENOUS | Status: DC

## 2021-01-10 MED ORDER — SODIUM CHLORIDE 0.9% IV SOLUTION: Freq: Once | INTRAVENOUS | Status: DC

## 2021-01-10 MED ORDER — SODIUM BICARBONATE 8.4 % IV SOLN
INTRAVENOUS | Status: DC
  Filled 2021-01-10 (×3): qty 1000
  Filled 2021-01-10 (×2): qty 150
  Filled 2021-01-10: qty 1000

## 2021-01-10 MED ORDER — DEXMEDETOMIDINE (PRECEDEX) IN NS 20 MCG/5ML (4 MCG/ML) IV SYRINGE
PREFILLED_SYRINGE | INTRAVENOUS | Status: DC | PRN
  Administered 2021-01-10: 8 ug via INTRAVENOUS

## 2021-01-10 MED ORDER — CHLORHEXIDINE GLUCONATE CLOTH 2 % EX PADS
6.0000 | MEDICATED_PAD | Freq: Every day | CUTANEOUS | Status: DC
  Administered 2021-01-10 – 2021-01-17 (×7): 6 via TOPICAL

## 2021-01-10 MED ORDER — SODIUM CHLORIDE 0.9 % IV SOLN: INTRAVENOUS | Status: DC | PRN

## 2021-01-10 MED ORDER — PROPOFOL 1000 MG/100ML IV EMUL
5.0000 ug/kg/min | INTRAVENOUS | Status: DC
  Administered 2021-01-10: 5 ug/kg/min via INTRAVENOUS
  Administered 2021-01-10: 45 ug/kg/min via INTRAVENOUS
  Administered 2021-01-11: 30 ug/kg/min via INTRAVENOUS
  Administered 2021-01-11: 45 ug/kg/min via INTRAVENOUS
  Administered 2021-01-12: 35 ug/kg/min via INTRAVENOUS
  Filled 2021-01-10 (×6): qty 100

## 2021-01-10 MED ORDER — FENTANYL CITRATE PF 50 MCG/ML IJ SOSY
50.0000 ug | PREFILLED_SYRINGE | Freq: Once | INTRAMUSCULAR | Status: DC
Start: 2021-01-10 — End: 2021-01-11

## 2021-01-10 MED ORDER — EPHEDRINE 5 MG/ML INJ
INTRAVENOUS | Status: AC
  Filled 2021-01-10: qty 5

## 2021-01-10 MED ORDER — ROCURONIUM BROMIDE 100 MG/10ML IV SOLN
INTRAVENOUS | Status: DC | PRN
  Administered 2021-01-10: 30 mg via INTRAVENOUS

## 2021-01-10 MED ORDER — MIDAZOLAM HCL 2 MG/2ML IJ SOLN
INTRAMUSCULAR | Status: AC
  Filled 2021-01-10: qty 2

## 2021-01-10 MED ORDER — FENTANYL BOLUS VIA INFUSION
50.0000 ug | INTRAVENOUS | Status: DC | PRN
  Administered 2021-01-11 (×2): 50 ug via INTRAVENOUS
  Filled 2021-01-10: qty 100

## 2021-01-10 MED ORDER — NOREPINEPHRINE 4 MG/250ML-% IV SOLN
INTRAVENOUS | Status: AC
  Administered 2021-01-10: 2 ug/min via INTRAVENOUS
  Filled 2021-01-10: qty 250

## 2021-01-10 MED ORDER — ALBUTEROL SULFATE HFA 108 (90 BASE) MCG/ACT IN AERS
INHALATION_SPRAY | RESPIRATORY_TRACT | Status: DC | PRN
  Administered 2021-01-10: 4 via RESPIRATORY_TRACT

## 2021-01-10 MED ORDER — DOCUSATE SODIUM 50 MG/5ML PO LIQD
100.0000 mg | Freq: Two times a day (BID) | ORAL | Status: DC
  Filled 2021-01-10: qty 10

## 2021-01-10 MED ORDER — VITAMIN K1 10 MG/ML IJ SOLN
5.0000 mg | Freq: Once | INTRAVENOUS | Status: AC
  Administered 2021-01-10: 5 mg via INTRAVENOUS
  Filled 2021-01-10: qty 0.5

## 2021-01-10 MED ORDER — SODIUM CHLORIDE 0.9% IV SOLUTION: Freq: Once | INTRAVENOUS | Status: AC

## 2021-01-10 MED ORDER — POLYETHYLENE GLYCOL 3350 17 G PO PACK: 17.0000 g | PACK | Freq: Every day | ORAL | Status: DC

## 2021-01-10 MED ORDER — FENTANYL 2500MCG IN NS 250ML (10MCG/ML) PREMIX INFUSION
50.0000 ug/h | INTRAVENOUS | Status: DC
  Administered 2021-01-10: 50 ug/h via INTRAVENOUS
  Administered 2021-01-11: 200 ug/h via INTRAVENOUS
  Administered 2021-01-11 – 2021-01-12 (×2): 150 ug/h via INTRAVENOUS
  Filled 2021-01-10 (×4): qty 250

## 2021-01-10 MED ORDER — NOREPINEPHRINE 4 MG/250ML-% IV SOLN
0.0000 ug/min | INTRAVENOUS | Status: DC
  Administered 2021-01-11 – 2021-01-12 (×2): 4 ug/min via INTRAVENOUS
  Filled 2021-01-10 (×2): qty 250

## 2021-01-10 MED ORDER — SODIUM CHLORIDE 0.9 % IV SOLN
20.0000 ug | Freq: Once | INTRAVENOUS | Status: AC
  Administered 2021-01-10: 20 ug via INTRAVENOUS
  Filled 2021-01-10: qty 5

## 2021-01-10 NOTE — Interval H&P Note (Signed)
History and Physical Interval Note:  01/10/2021 10:33 AM  Carolyn Herring  has presented today for surgery, with the diagnosis of Upper GI bleed, Gastric ulcer, acute posthemorrhagic anemia.  The various methods of treatment have been discussed with the patient and family. After consideration of risks, benefits and other options for treatment, the patient has consented to  Procedure(s): ESOPHAGOGASTRODUODENOSCOPY (EGD) (N/A) as a surgical intervention.  The patient's history has been reviewed, patient examined, no change in status, stable for surgery.  I have reviewed the patient's chart and labs.  Questions were answered to the patient's satisfaction.     [place], [place]

## 2021-01-10 NOTE — Op Note (Signed)
[facility] Patient Name: Carolyn Herring Procedure Date: 01/10/2021 11:06 AM MRN: [id number] Account #: [id number] Date of Birth: Sep 23, 1975 Admit Type: Outpatient Age: 45 Room: [facility] ENDO ROOM 4 Gender: Female Note Status: Finalized Instrument Name: Upper Endoscope [id number] Procedure:             Upper GI endoscopy Indications:           Melena, Recent gastrointestinal bleeding, Gastric                         ulcer with hemorrhage Providers:             Boykin Nearing. Dhanya Bogle MD, MD Medicines:             Propofol per Anesthesia Complications:         Cardiopulmonary arrest Procedure:             Pre-Anesthesia Assessment:                        - The risks and benefits of the procedure and the                         sedation options and risks were discussed with the                         patient. All questions were answered and informed                         consent was obtained.                        - Patient identification and proposed procedure were                         verified prior to the procedure by the nurse. The                         procedure was verified in the procedure room.                        - ASA Grade Assessment: III - A patient with severe                         systemic disease.                        - After reviewing the risks and benefits, the patient                         was deemed in satisfactory condition to undergo the                         procedure.                        After obtaining informed consent, the endoscope was                         passed under direct vision. Throughout the procedure,  the patient's blood pressure, pulse, and oxygen                         saturations were monitored continuously. The Endoscope                         was introduced through the mouth, and advanced to the                         jejunum. The upper GI endoscopy was accomplished                          without difficulty. The patient tolerated the                         procedure poorly due to the patient's medical                         instability. Findings:      The esophagus was normal.      The examined jejunum was normal.      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 A gastric       pouch with a large size was found containing ulceration. The       gastroduodenal anastomosis was characterized by ulceration. This was       traversed.      One non-bleeding gastric ulcer with stigmata of recent bleeding noted       with large visible vessel was found in the gastric antrum. The lesion       was cratered and measured approximately 4x 3cm in largest dimension. The       scope was withdrawn and replaced with the therapeutic endoscope in order       to treat bleeding. Area was unsuccessfully injected with 3 mL of a       1:10,000 solution of epinephrine for hemostasis. Coagulation for       hemostasis using bipolar probe was unsuccessful. To stop active       bleeding, hemostatic spray was deployed. Several sprays were applied.       Unknown if bleeding was stopped due to poor visibility. Impression:            - Normal esophagus.                        - Normal examined jejunum.                        - Patent Billroth I gastroduodenostomy was found,                         characterized by ulceration.                        - Spurting gastric ulcer with a visible vessel. NSAID                         induced etiology. There is no evidence of perforation.                         Treatment not successful. Treatment not successful.  Treated with bipolar cautery. hemostatic spray applied.                        - No specimens collected. Recommendation:        - After the procedure, the patient went into                         cardiopulmonary arrest and is under maneuvers to                         revive the patient  during this time. See the chart for                         further details and disposition. Procedure Code(s):     --- Professional ---                        [id number], Esophagogastroduodenoscopy, flexible,                         transoral; with control of bleeding, any method Diagnosis Code(s):     --- Professional ---                        K25.4, Chronic or unspecified gastric ulcer with                         hemorrhage                        K92.2, Gastrointestinal hemorrhage, unspecified                        K92.1, Melena (includes Hematochezia)                        T39.395S, Adverse effect of other nonsteroidal                         anti-inflammatory drugs [NSAID], sequela                        Z98.0, Intestinal bypass and anastomosis status CPT copyright 2019 American Medical Association. All rights reserved. The codes documented in this report are preliminary and upon coder review may  be revised to meet current compliance requirements. Stanton Kidney MD, MD 01/10/2021 12:09:56 PM This report has been signed electronically. Number of Addenda: 0 Note Initiated On: 01/10/2021 11:06 AM Estimated Blood Loss:  Estimated blood loss: Greater than 100 mL.      [facility]

## 2021-01-10 NOTE — TOC Progression Note (Signed)
Transition of Care [facility]) - Progression Note    Patient Details  Name: KEMYA SHED MRN: [id number] Date of Birth: 08/22/1975  [facility] CM/SW Contact  Marina Goodell Phone Number: [phone] 01/10/2021, 2:31 PM  Clinical Narrative:     Patient currently in ICU. Esophagogastroduodenoscopy as surgical intervention for 01/10/2021 due to gastric bleeding after melanic bowel movement the prior morning. Patient experienced cardiac arrest during procedure, patient moved to ICU. Current discharge plan is for SNF placement at [facility].  [place] [facility] Norma has started English as a second language teacher for SNF placement. Main contact Donnal Debar (Brother) [phone] (Mobile). Patient remains intubated/sedated, FULL CODE.  Expected Discharge Plan: Skilled Nursing Facility Barriers to Discharge: Continued Medical Work up  Expected Discharge Plan and Services Expected Discharge Plan: Skilled Nursing Facility In-house Referral: Clinical Social Work   Post Acute Care Choice: Skilled Nursing Facility Living arrangements for the past 2 months: Single Family Home                                       Social Determinants of Health (SDOH) Interventions    Readmission Risk Interventions No flowsheet data found.

## 2021-01-10 NOTE — Progress Notes (Signed)
Neuro: sedated with fentanyl and propofol Resp: stable on ventilator, suctioning bloody mucous CV: hypothermic-bari huggar applied, placed on levophed for BP support GIGU: foley placed, profuse bloody stool with large clots passed, NPO Skin: some bruising otherwise clean dry and intact Social: Brother and sister in law came to the bedside this evening, updated by Mds and RN, all questions and concerns addressed  Events: Received patient S/P code in endoscopy lab, transfused 2 units PRBC, 2 units FFP, 1 unit platelets

## 2021-01-10 NOTE — Consult Note (Signed)
[facility] SURGICAL CONSULTATION NOTE (initial) - cpt: 99255  HISTORY OF PRESENT ILLNESS (HPI):  45 y.o. female coded during endoscopic attempts to control gastric hemorrhage.  Noted to have a large gastric ulcer, various methodologies utilized to attempt control.  Reported potential aspiration associated with procedure interrupted by code.  Degree of uncertainty remains as to control of bleeding from ulcer. She previously undergone endoscopy shortly after admission, a giant ulcer with clot was noted, patient's history included grinding NSAIDs. Patient reportedly had very large melenic stool early this AM with concurrent drop in Hgb to 5.9 today from 9.7 of yesterday.  Surgery is consulted by hospitalist physician Dr. Fran Lowes in this context for evaluation and management gastric ulcer hemorrhage.  Patient has now been transferred to the intensive care unit, in critical condition with significant metabolic acidosis.  Massive transfusion protocol has been initiated and patient has received multiple units of blood already.    PAST MEDICAL HISTORY (PMH):  Past Medical History:  Diagnosis Date   Hypertension    Spina bifida (HCC)      PAST SURGICAL HISTORY (PSH):  Past Surgical History:  Procedure Laterality Date   ESOPHAGOGASTRODUODENOSCOPY (EGD) WITH PROPOFOL N/A 01/05/2021   Procedure: ESOPHAGOGASTRODUODENOSCOPY (EGD) WITH PROPOFOL;  Surgeon: Regis Bill, MD;  Location: [facility] ENDOSCOPY;  Service: Endoscopy;  Laterality: N/A;   TEE WITHOUT CARDIOVERSION N/A 01/08/2021   Procedure: TRANSESOPHAGEAL ECHOCARDIOGRAM (TEE);  Surgeon: Antonieta Iba, MD;  Location: [facility] ORS;  Service: Cardiovascular;  Laterality: N/A;     MEDICATIONS:  Prior to Admission medications   Medication Sig Start Date End Date Taking? Authorizing Provider  ibuprofen (ADVIL) 200 MG tablet Take 400 mg by mouth every 6 (six) hours as needed for moderate pain.   Yes [provider]   diphenhydrAMINE (BENADRYL) 25 MG tablet Take 1 tablet (25 mg total) by mouth every 6 (six) hours as needed. Patient not taking: Reported on 01/04/2021 10/12/19   Sharman Cheek, MD  ibuprofen (ADVIL,MOTRIN) 600 MG tablet Take 1 tablet (600 mg total) by mouth every 8 (eight) hours as needed for moderate pain. Patient not taking: Reported on 01/04/2021 10/21/17   Menshew, Charlesetta Ivory, PA-C     ALLERGIES:  Allergies  Allergen Reactions   Tape Dermatitis    **BLISTERS** **BLISTERS**    Codeine Itching   Hydrogen Peroxide Other (See Comments)    Skin Irritation   Latex Other (See Comments)    Skin Irritation; blisters      SOCIAL HISTORY:  Social History   Socioeconomic History   Marital status: Divorced    Spouse name: Not on file   Number of children: Not on file   Years of education: Not on file   Highest education level: Not on file  Occupational History   Not on file  Tobacco Use   Smoking status: Every Day    Packs/day: 0.50    Types: Cigarettes   Smokeless tobacco: Never  Vaping Use   Vaping Use: Never used  Substance and Sexual Activity   Alcohol use: Never   Drug use: Never   Sexual activity: Not on file  Other Topics Concern   Not on file  Social History Narrative   Not on file   Social Determinants of Health   Financial Resource Strain: Not on file  Food Insecurity: Not on file  Transportation Needs: Not on file  Physical Activity: Not on file  Stress: Not on file  Social Connections: Not on file  Intimate  Partner Violence: Not on file     FAMILY HISTORY:  History reviewed. No pertinent family history.    REVIEW OF SYSTEMS:  Review of Systems  Unable to perform ROS: Intubated   VITAL SIGNS:  Temp:  [95 F (35 C)-99.5 F (37.5 C)] 98.4 F (36.9 C) (10/16 1900) Pulse Rate:  [75-116] 94 (10/16 1900) Resp:  [18-30] 30 (10/16 1900) BP: (98-128)/(52-96) 98/65 (10/16 1900) SpO2:  [97 %-100 %] 99 % (10/16 1900) Arterial Line BP:  (85-135)/(52-83) 107/64 (10/16 1900) FiO2 (%):  [60 %-100 %] 60 % (10/16 1945) Weight:  [119 lb 11.4 oz (54.3 kg)] 119 lb 11.4 oz (54.3 kg) (10/16 0500)     Height: 5' (152.4 cm) Weight: 119 lb 11.4 oz (54.3 kg) BMI (Calculated): 23.38   INTAKE/OUTPUT:  10/15 0701 - 10/16 0700 In: 867.9 [P.O.:237; I.V.:13.2; Blood:617.7] Out: 1850 [Urine:1850]  PHYSICAL EXAM:  Physical Exam Blood pressure 98/65, pulse 94, temperature 98.4 F (36.9 C), resp. rate (!) 30, height 5' (1.524 m), weight 119 lb 11.4 oz (54.3 kg), SpO2 99 %. Last Weight  Most recent update: 01/10/2021  5:51 AM    Weight  54.3 kg (119 lb 11.4 oz)             CONSTITUTIONAL: Intubated sedated. NECK: Trachea is midline.   RESPIRATORY: Intubated rate controlled. CARDIOVASCULAR: Heart is regular in rate and rhythm, current heart rate in the 90s. GI: The abdomen is mildly distended, otherwise soft, nontender.    SKIN: Skin turgor is normal. No pathologic skin lesions appreciated.   I have personally reviewed what is currently available of the patient's imaging, recent labs and medical records.    Labs:  CBC Latest Ref Rng & Units 01/10/2021 01/10/2021 01/10/2021  WBC 4.0 - 10.5 K/uL - 38.5(H) -  Hemoglobin 12.0 - 15.0 g/dL 27.7 10.5(L) 10.3(L)  Hematocrit 36.0 - 46.0 % 40.3 31.0(L) 31.3(L)  Platelets 150 - 400 K/uL - 84(L) 84(L)   CMP Latest Ref Rng & Units 01/10/2021 01/10/2021 01/09/2021  Glucose 70 - 99 mg/dL 824(M) 96 95  BUN 6 - 20 mg/dL 19 35(T) 61(W)  Creatinine 0.44 - 1.00 mg/dL 4.31 5.40 0.86  Sodium 135 - 145 mmol/L 138 136 136  Potassium 3.5 - 5.1 mmol/L 4.1 3.6 3.6  Chloride 98 - 111 mmol/L 109 108 105  CO2 22 - 32 mmol/L 16(L) 21(L) 24  Calcium 8.9 - 10.3 mg/dL 6.6(L) 7.6(L) 7.8(L)  Total Protein 6.5 - 8.1 g/dL - - -  Total Bilirubin 0.3 - 1.2 mg/dL - - -  Alkaline Phos 38 - 126 U/L - - -  AST 15 - 41 U/L - - -  ALT 0 - 44 U/L - - -     Imaging studies:   Last 24 hrs: Portable Chest  x-ray  Result Date: 01/10/2021 CLINICAL DATA:  Endotracheal tube placement EXAM: PORTABLE CHEST 1 VIEW COMPARISON:  01/04/2021 CT.  01/04/2021 plain film. FINDINGS: Endotracheal tube terminates 2.5 cm above carina. Esophageal thermometer. Right-sided VP shunt catheter. Cardiomegaly accentuated by AP portable technique. Pacer projects over left chest. No pleural effusion or pneumothorax. Development of diffuse interstitial and airspace disease since 01/04/2021. Somewhat more confluent in the right upper lobe. IMPRESSION: Endotracheal tube 2.6 cm above the carina. Development of diffuse interstitial and airspace disease, at least partially felt to be secondary to pulmonary edema. Concurrent infection or aspiration cannot be excluded. Electronically Signed   By: Jeronimo Greaves M.D.   On: 01/10/2021 13:54  Assessment/Plan:  45 y.o. 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  Patient Active Problem List   Diagnosis Date Noted   Pressure injury of skin 01/07/2021   Fall    Altered mental status    History of brain shunt    Hemorrhagic shock (HCC) 01/04/2021    -We will follow ICU resuscitation.  Considering the significant trauma/morbidity of being coded today, will defer surgical intervention until better able to withstand any additional trauma/insult of surgery.  It is possible that adequate hemostasis has been obtained, and tolerating anesthesia/surgery at this point in time may stretch the limits of this patient's tolerance.  -This was discussed with Dr. Jayme Cloud, I appreciate her input.  As well as discussed with patient's brother and sister-in-law present at bedside.  -ICU to determine need for further massive transfusion protocol, appreciate their assistance in stabilizing this patient.  - DVT prophylaxis  -Hopefully alternative opportunities to ensure adequate hemostasis may be available during the week.  We remain  readily available to help in any way.    Thank you for the opportunity to participate in this patient's care.   -- Campbell Lerner, M.D., FACS 01/10/2021, 8:27 PM

## 2021-01-10 NOTE — Progress Notes (Addendum)
Pharmacy Antibiotic Note  Carolyn Herring is a 45 y.o. female admitted on 01/04/2021 with hemorrhagic shock and severe sepsis secondary to candidemia. Patient is currently receiving Eraxis. Pharmacy now consulted for Unasyn dosing for aspiration PNA. ID is following patient.   Plan: Will start Unasyn 3g IV every 6 hours.   Height: 5' (152.4 cm) Weight: 54.3 kg (119 lb 11.4 oz) IBW/kg (Calculated) : 45.5  Temp (24hrs), Avg:98.3 F (36.8 C), Min:95.4 F (35.2 C), Max:99.7 F (37.6 C)  Recent Labs  Lab 01/04/21 1205 01/04/21 1547 01/05/21 0540 01/06/21 0415 01/07/21 0455 01/08/21 0514 01/09/21 0438 01/10/21 0506  WBC 30.2*  --  24.2* 18.7* 11.3* 21.4* 22.0* 33.7*  CREATININE 0.86  --  0.75 0.71 0.62 0.56 0.66 0.52  LATICACIDVEN 3.6* 2.5* 2.1* 1.2  --   --   --   --     Estimated Creatinine Clearance: 64.5 mL/min (by C-G formula based on SCr of 0.52 mg/dL).    Allergies  Allergen Reactions   Tape Dermatitis    **BLISTERS** **BLISTERS**    Codeine Itching   Hydrogen Peroxide Other (See Comments)    Skin Irritation   Latex Other (See Comments)    Skin Irritation; blisters     Antimicrobials this admission: 10/10 Unasyn>>10/13 10/13 Anidulafungin>>  10/16 Unasyn>>    Microbiology results: 10/10 blood culture (single) with yeast, BCID with C. Krusei 10/10 UCx: Insignificant growth   Thank you for allowing pharmacy to be a part of this patient's care.  Gardner Candle, PharmD, BCPS Clinical Pharmacist 01/10/2021 1:30 PM

## 2021-01-10 NOTE — Consult Note (Signed)
NAME:  Carolyn Herring, MRN:  [id number], DOB:  [birth year], LOS: 6 ADMISSION DATE:  01/04/2021, CONSULTATION DATE: 01/10/2021 REFERRING MD: Dr. Tamala Julian, CHIEF COMPLAINT: Code Blue during endoscopy  History of Present Illness:  This is a 45 yo female with a PMH of HTN, Hydrocephalus, and 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 last revised in 2019.  She has not seen a neurosurgeon in 3 yrs.  She presented to [facility] ER on 10/10 via EMS from home with recurrent falls, generalized weakness, and back pain.  She is unsure if she has been hitting her head during the falls.  She also endorsed several week hx of abdominal pain and diarrhea.  She was diagnosed with COVID-19 in 08/2020, and has had worsening generalized weakness since the dx.  She has not seen a PCP for evaluation of symptoms.  She does live alone, but her aunt lives next door.  On 10/10 pt able to notify EMS for assistance, however when they arrived at her home she was unable to answer the door.  Therefore,  EMS had to break her door down to gain entry.    ED course  Upon arrival to the ER pt bradycardic hr 57 and hypotensive sbp 60-90's.  She received 2L LR bolus with sbp increasing to 100's.  Pt also encephalopathic.  EKG revealed sinus tachycardia with nonspecific ST changes in inferior leads. Lab results revealed K+ 3.4, glucose 144, albumin 2.1, lactic acid 3.6, pct 24.93, wbc 30.1, hgb 2.7, hct 10.4, and stool occult positive.  Pt does alternate ibuprofen and advil every 4 to 6 hrs for abdominal pain.  She also reports heavy menstrual cycles.  Massive blood transfusion protocol activated and pt received: 2 units of pRBC's and 1 unit of FFP.  Protonix bolus followed by protonix gtt initiated.  COVID-19/Influenza A&B and CXR negative.   Due to concern of sepsis pt received empiric zosyn.  PCCM contacted for ICU admission.    Hospital course Was admitted and subsequently resuscitated with blood transfusions and IV  fluids and was able to transfer to the regular medical floor.  Her encephalopathy cleared.  She underwent upper endoscopy on 05 January 2021 that revealed a very large antral ulcer with adherent clot.  Clot was not removed.  The ulcer description was that of a giant ulcer.  It was believed the etiology was from the patient grinding NSAIDs to ingest them for control of pain.  She apparently stabilized after this initial EGD procedure and no further drop in H&H was noted.  She then underwent a TEE with anesthesia monitoring and did not have difficulties through that.  However yesterday it appeared that she had melenic stools and her H&H did have a significant drop.  GI was then reconsulted and EGD was planned for today.  The patient again was noted to have a large gastric ulcer it was noted to be bleeding actively and various methodologies were utilized to attempt control however the patient developed PEA arrest and respiratory distress after aspiration of blood.  The patient underwent massive transfusion protocol.  CODE BLUE situation ensued the patient was resuscitated.  She was transferred to the intensive care unit intubated and mechanically ventilated.  Patient received 4 units of blood during resuscitation.  She has also required fresh frozen plasma and platelets transfusion.  Currently requiring pressors.  She is profoundly acidotic.  Intubated and unresponsive on the vent.  PCCM consulted to assume care while patient is in  ICU.  Pertinent  Medical History  HTN Hydrocephalus 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 last revised in 2019  Significant Hospital Events: Including procedures, antibiotic start and stop dates in addition to other pertinent events   10/10: Pt admitted to ICU with acute encephalopathy concerning for acute CVA, hemorrhagic shock, possible sepsis, and acute hypoxic respiratory failure secondary to possible aspiration pneumonia  10/16: readmit to ICU  with hemorrhagic shock S/P PEA arrest during endoscopy  Significant Tests:  CT Head/Cervical Spine 10/10>>New hypodense right subdural collection measuring 0.9 cm, without evidence of acute blood products. Underlying loss of gray-white matter differentiation in the right parietal lobe, concerning for acute-subacute infarct. Recommend MRI. No acute cervical spine fracture. Unchanged chronic congenital and postoperative changes. CT Chest/Abd/Pelvis 10/10>>No evidence of acute trauma in the chest. Focal peripheral hypodensity in the upper posterior aspect of the spleen, morphology favoring small cyst, hemangioma, or possibly splenic infarct, and felt unlikely to be acute trauma. No other acute findings in the abdomen or pelvis. MRI brain 10/12>> demonstrated acute or subacute infarcts, dural thickening suggestive of intracranial hypotension possibly due to over shunting.  Poor flow in the ACA and MCA, nonopacification of the left vertebral artery CT angio head and neck 10/12>> nonopacification of the right internal carotid artery, nonopacification of the proximal left vertebral artery, diminutive MCA and ACA   Micro Data:  Respiratory Panel by RT-PCR 10/10>>negative  Blood 10/10>>negative  CSF 10/13>> Antimicrobial:  Zosyn 10/10 x1 dose Unasyn 10/10>>  Interim History / Subjective:  No complaints at this time   Objective   Blood pressure 114/76, pulse 94, temperature 98.6 F (37 C), resp. rate 18, height 5' (1.524 m), weight 54.3 kg, SpO2 97 %.        Intake/Output Summary (Last 24 hours) at 01/10/2021 1254 Last data filed at 01/10/2021 1040 Gross per 24 hour  Intake 1068.96 ml  Output 1050 ml  Net 18.96 ml    Filed Weights   01/08/21 0703 01/09/21 0500 01/10/21 0500  Weight: 50.9 kg 54.2 kg 54.3 kg    Examination: General: acute on chronically ill appearing female, intubated, mechanically ventilated HEENT: Edentulous, OG, NG in place, ETT orotracheally placed Lungs: Rhonchi  and rales throughout, even, mechanically vented Cardiovascular: sinus tachycardia, s1s2, no R/G, 2+ radial/1+ distal  Abdomen: protuberant, distended, quiet BS, maroon stool + clots Extremities: left hand contracted,diffuse muscle wasting Neuro: sedated, cannot assess further PERRL, disconjugate gaze (at baseline) GU: foley @ place  Pressure Injury 01/06/21 Ankle Anterior;Right Stage 2 -  Partial thickness loss of dermis presenting as a shallow open injury with a red, pink wound bed without slough. (Active)  01/06/21 2200  Location: Ankle  Location Orientation: Anterior;Right  Staging: Stage 2 -  Partial thickness loss of dermis presenting as a shallow open injury with a red, pink wound bed without slough.  Wound Description (Comments):   Present on Admission:     Resolved Hospital Problem list     Assessment & Plan:  Acute respiratory failure with hypoxia and hypercarbia due to PEA arrest Aspiration pneumonia (blood aspirated) Continue ventilator support, vent changes made Using ARDS net protocol due to patient's restrictive physiology from skeletal abnormalities as well as aspiration pneumonia VAP protocol Titrate O2 to keep saturations at 92% or better Bronchodilators as needed Empirically cover for aspiration pneumonia  S/P PEA arrest D/T 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 - Continuous telemetry monitoring  - Trend troponin  - Aggressive iv fluid  resuscitation to maintain map >65  - Pressors as needed  - Serial H&H - DIC panel - Transfusion protocol in place - Monitor for s/sx of bleeding - Transfuse for hgb <7 - Continue IV protonix, infusion - Avoid all NSAID's  - General Surgery consulted , discussed in detail with Dr. 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 Supportive care Avoid sedatives as able Exceedingly guarded prognosis  Best Practice (right click and "Reselect all SmartList Selections" daily)   Diet/type: NPO DVT prophylaxis: SCD's GI prophylaxis: PPI ifusion Lines: R Fem CVL, a-line L radial, IO Left tib Foley:  N/A Code Status:  full code Last date of multidisciplinary goals of care discussion [N/A]  Labs   CBC: Recent Labs  Lab 01/04/21 1205 01/04/21 1458 01/06/21 0415 01/07/21 0455 01/08/21 0514 01/09/21 0438 01/09/21 0802 01/09/21 1850 01/10/21 0506  WBC 30.2*   < > 18.7* 11.3* 21.4* 22.0*  --   --  33.7*  NEUTROABS 27.0*  --   --   --   --   --   --   --   --   HGB 2.7*   < > 11.9* 9.4* 9.7* 5.9* 5.9* 12.1 9.8*  HCT 10.4*   < > 36.3 29.4* 30.6* 19.0* 19.4* 36.8 29.4*  MCV 65.0*   < > 81.4 80.5 83.8 85.6  --   --  85.5  PLT 721*   < > 288 226 253 244  --   --  150   < > = values in this interval not displayed.     Basic Metabolic Panel: Recent Labs  Lab 01/05/21 0540 01/06/21 0415 01/07/21 0455 01/08/21 0514 01/09/21 0438 01/10/21 0506  NA 138 141 136 136 136 136  K 4.1 4.6 4.2 3.9 3.6 3.6  CL 107 109 104 107 105 108  CO2 21* _0 21*  GLUCOSE 122* 124* 110* 115* 95 96  BUN 21* _1 21* 22*  CREATININE 0.75 0.71 0.62 0.56 0.66 0.52  CALCIUM 8.1* 8.6* 8.1* 8.3* 7.8* 7.6*  MG 2.0  --  2.0 2.0 1.9 1.7  PHOS 2.9  --  2.9  --   --   --     GFR: Estimated Creatinine Clearance: 64.5 mL/min (by C-G formula based on SCr of 0.52 mg/dL). Recent Labs  Lab 01/04/21 1205 01/04/21 1547 01/05/21 0538 01/05/21 0540 01/06/21 0415 01/07/21 0455 01/08/21 0514 01/09/21 0438 01/10/21 0506  PROCALCITON 24.93  --  10.66  --   --   --   --   --   --   WBC 30.2*  --   --  24.2* 18.7* 11.3* 21.4* 22.0* 33.7*  LATICACIDVEN 3.6* 2.5*  --  2.1* 1.2  --   --   --   --      Liver Function  Tests: Recent Labs  Lab 01/04/21 1205  AST 20  ALT 13  ALKPHOS 82  BILITOT 0.6  PROT 5.5*  ALBUMIN 2.1*    Recent Labs  Lab 01/04/21 1205  LIPASE 50     ABG    Component Value Date/Time   PHART <6.9 (L) 01/10/2021 1253   PCO2ART 63 01/10/2021 1253   PO2ART 144(H) 01/10/2021  1253   HCO3 12.1(L) 01/10/2021 1253   ACIDBASEDEF 20.6(H) 01/10/2021 1253   O2SAT 97.0 01/10/2021 1253     Coagulation Profile: Recent Labs  Lab 01/04/21 1205 01/04/21 1547 01/04/21 1915 01/05/21 0540  INR 1.2 1.2 1.1 1.1     Cardiac Enzymes: Recent Labs  Lab 01/04/21 1205  CKTOTAL 85     HbA1C: Hgb A1c MFr Bld  Date/Time Value Ref Range Status  01/05/2021 05:40 AM 5.4 4.8 - 5.6 % Final    Comment:    (NOTE)         Prediabetes: 5.7 - 6.4         Diabetes: >6.4         Glycemic control for adults with diabetes: <7.0     CBG: Recent Labs  Lab 01/04/21 1704 01/10/21 0348  GLUCAP 100* 95    Review of Systems:   Patient unable to provide due to obtundation, intubation and mechanical ventilation.  Past Medical History:   Past Medical History:  Diagnosis Date   Hypertension    Spina bifida [facility])    Surgical History:   Past Surgical History:  Procedure Laterality Date   ESOPHAGOGASTRODUODENOSCOPY (EGD) WITH PROPOFOL N/A 01/05/2021   Procedure: ESOPHAGOGASTRODUODENOSCOPY (EGD) WITH PROPOFOL;  Surgeon: Lesly Rubenstein, MD;  Location: [facility] ENDOSCOPY;  Service: Endoscopy;  Laterality: N/A;   TEE WITHOUT CARDIOVERSION N/A 01/08/2021   Procedure: TRANSESOPHAGEAL ECHOCARDIOGRAM (TEE);  Surgeon: Minna Merritts, MD;  Location: [facility] ORS;  Service: Cardiovascular;  Laterality: N/A;     Social History:   Social History   Tobacco Use   Smoking status: Every Day    Packs/day: 0.50    Types: Cigarettes   Smokeless tobacco: Never  Substance Use Topics   Alcohol use: Never     Family History:  Her family history is not on file.   Allergies Allergies   Allergen Reactions   Tape Dermatitis    **BLISTERS** **BLISTERS**    Codeine Itching   Hydrogen Peroxide Other (See Comments)    Skin Irritation   Latex Other (See Comments)    Skin Irritation; blisters      Home Medications  Prior to Admission medications   Medication Sig Start Date End Date Taking? Authorizing Provider  diphenhydrAMINE (BENADRYL) 25 MG tablet Take 1 tablet (25 mg total) by mouth every 6 (six) hours as needed. 10/12/19   Carrie Mew, MD  ibuprofen (ADVIL,MOTRIN) 600 MG tablet Take 1 tablet (600 mg total) by mouth every 8 (eight) hours as needed for moderate pain. 10/21/17   Menshew, Dannielle Karvonen, PA-C    Scheduled Meds:  sodium chloride   Intravenous Once   budesonide (PULMICORT) nebulizer solution  0.5 mg Nebulization BID   chlorhexidine gluconate (MEDLINE KIT)  15 mL Mouth Rinse BID   Chlorhexidine Gluconate Cloth  6 each Topical Daily   docusate  100 mg Per Tube BID   fentaNYL (SUBLIMAZE) injection  50 mcg Intravenous Once   ipratropium-albuterol  3 mL Nebulization Q6H   mouth rinse  15 mL Mouth Rinse 10 times per day   polyethylene glycol  17 g Per Tube Daily   Continuous Infusions:  sodium chloride 20 mL/hr at 01/10/21 1415   ampicillin-sulbactam (UNASYN) IV Stopped (01/10/21 1846)   anidulafungin 100 mg (01/09/21 1747)   fentaNYL infusion INTRAVENOUS 200 mcg/hr (01/10/21 1900)   norepinephrine (LEVOPHED) Adult infusion 2 mcg/min (01/10/21 1900)   pantoprazole Stopped (01/10/21 1052)   propofol (DIPRIVAN) infusion 45 mcg/kg/min (01/10/21 1901)  sodium bicarbonate 150 mEq in D5W infusion 150 mL/hr at 01/10/21 1900   PRN Meds:.acetaminophen, fentaNYL, hydrALAZINE, loperamide, ondansetron (ZOFRAN) IV   Critical care time: 90 minutes    The patient is critically ill with multiple organ systems failure and requires high complexity decision making for assessment and support, frequent evaluation and titration of therapies, application of advanced  monitoring technologies and extensive interpretation of multiple databases. Critical Care Time devoted to patient care services described in this note is 90 minutes.  Updated patient's aunt, Tressia Miners via phone and patient's brother Drenda Freeze, at bedside.  Renold Don, MD Advanced Bronchoscopy PCCM [facility]    *This note was dictated using voice recognition software/Dragon.  Despite best efforts to proofread, errors can occur which can change the meaning.  Any change was purely unintentional.

## 2021-01-10 NOTE — Anesthesia Procedure Notes (Signed)
Procedure Name: Intubation Date/Time: 01/10/2021 11:46 AM Performed by: Jaye Beagle, CRNA Pre-anesthesia Checklist: Patient identified, Emergency Drugs available, Suction available and Patient being monitored Patient Re-evaluated:Patient Re-evaluated prior to induction Oxygen Delivery Method: Circle system utilized Preoxygenation: Pre-oxygenation with 100% oxygen Induction Type: IV induction Grade View: Grade I Tube type: Oral Tube size: 6.5 mm Number of attempts: 1 Airway Equipment and Method: Stylet and Oral airway Placement Confirmation: ETT inserted through vocal cords under direct vision, positive ETCO2 and breath sounds checked- equal and bilateral Secured at: 21 cm Tube secured with: Tape Dental Injury: Teeth and Oropharynx as per pre-operative assessment

## 2021-01-10 NOTE — Anesthesia Preprocedure Evaluation (Signed)
Anesthesia Evaluation  Patient identified by MRN, date of birth, ID band Patient awake    Reviewed: Allergy & Precautions, H&P , NPO status , Patient's Chart, lab work & pertinent test results, reviewed documented beta blocker date and time   History of Anesthesia Complications Negative for: history of anesthetic complications  Airway Mallampati: II   Neck ROM: full    Dental  (+) Poor Dentition, Dental Advidsory Given   Pulmonary neg shortness of breath, COPD, neg recent URI, Current Smoker,    Pulmonary exam normal        Cardiovascular Exercise Tolerance: Poor hypertension, On Medications (-) angina(-) Past MI and (-) Cardiac Stents Normal cardiovascular exam(-) dysrhythmias (-) Valvular Problems/Murmurs Rhythm:regular Rate:Normal     Neuro/Psych neg Seizures Hx of spina bifida and VP shunt. ja negative neurological ROS  negative psych ROS   GI/Hepatic negative GI ROS, Neg liver ROS,   Endo/Other  negative endocrine ROS  Renal/GU negative Renal ROS  negative genitourinary   Musculoskeletal   Abdominal   Peds  Hematology  (+) Blood dyscrasia, anemia ,   Anesthesia Other Findings Past Medical History: No date: Hypertension No date: Spina bifida [facility]) Past Surgical History: 01/05/2021: ESOPHAGOGASTRODUODENOSCOPY (EGD) WITH PROPOFOL; N/A     Comment:  Procedure: ESOPHAGOGASTRODUODENOSCOPY (EGD) WITH               PROPOFOL;  Surgeon: Regis Bill, MD;  Location:               [facility] ENDOSCOPY;  Service: Endoscopy;  Laterality: N/A; BMI    Body Mass Index: 21.92 kg/m     Reproductive/Obstetrics negative OB ROS                             Anesthesia Physical  Anesthesia Plan  ASA: 3  Anesthesia Plan: General   Post-op Pain Management:    Induction: Intravenous  PONV Risk Score and Plan: 2 and TIVA and Propofol infusion  Airway Management Planned: Natural Airway and  Nasal Cannula  Additional Equipment:   Intra-op Plan:   Post-operative Plan:   Informed Consent: I have reviewed the patients History and Physical, chart, labs and discussed the procedure including the risks, benefits and alternatives for the proposed anesthesia with the patient or authorized representative who has indicated his/her understanding and acceptance.     Dental Advisory Given  Plan Discussed with: CRNA  Anesthesia Plan Comments:         Anesthesia Quick Evaluation

## 2021-01-10 NOTE — Anesthesia Procedure Notes (Addendum)
Arterial Line Insertion Start/End10/16/2022 12:25 AM, 01/10/2021 12:30 PM Performed by: Lenard Simmer, MD, anesthesiologist  Patient location: OR. Preanesthetic checklist: patient identified, IV checked, site marked, risks and benefits discussed, surgical consent, monitors and equipment checked, pre-op evaluation, timeout performed and anesthesia consent Lidocaine 1% used for infiltration and patient sedated Left, radial was placed Catheter size: 20 G Hand hygiene performed  and maximum sterile barriers used   Attempts: 1 Procedure performed using ultrasound guided technique. Ultrasound Notes:anatomy identified, needle tip was noted to be adjacent to the nerve/plexus identified and no ultrasound evidence of intravascular and/or intraneural injection Following insertion, dressing applied and Biopatch. Post procedure assessment: normal and unchanged  Patient tolerated the procedure well with no immediate complications.

## 2021-01-10 NOTE — Progress Notes (Signed)
   01/10/21 1300  Clinical Encounter Type  Visited With Patient  Visit Type Follow-up  Referral From Nurse  Consult/Referral To Chaplain  Spiritual Encounters  Spiritual Needs Prayer;Emotional  Chaplain Murel Shenberger responded to a Code Blue in Endo Room #4. Pt coded during surgery, medical team worked on patient. Pt was moved to ICU 7 and is intubated. Staff have attempted to contact family members but no response. I provided a ministry of presence, emotional and spiritual support for Pt and staff. Chaplain made serveral visits to ICU 7 to FU and for support. Nurses asked me if any family members had arrived and I said, no not as of yet. (1340 hrs)

## 2021-01-10 NOTE — Transfer of Care (Signed)
Immediate Anesthesia Transfer of Care Note  Patient: Carolyn Herring  Procedure(s) Performed: ESOPHAGOGASTRODUODENOSCOPY (EGD)  Patient Location: PACU and ICU  Anesthesia Type:General  Level of Consciousness: Patient remains intubated per anesthesia plan  Airway & Oxygen Therapy: Patient remains intubated per anesthesia plan  Post-op Assessment: Report given to RN  Post vital signs: stable  Last Vitals:  Vitals Value Taken Time  BP 104/82 01/10/21 1301  Temp 35.4 C 01/10/21 1311  Pulse    Resp 31 01/10/21 1311  SpO2    Vitals shown include unvalidated device data.  Last Pain:  Vitals:   01/10/21 1045  TempSrc:   PainSc: 0-No pain      Patients Stated Pain Goal: 0 (01/10/21 1045)  Complications: No notable events documented.

## 2021-01-10 NOTE — ED Provider Notes (Signed)
[facility] Department of Emergency Medicine   Code Blue CONSULT NOTE  Chief Complaint: Cardiac arrest/unresponsive   Level V Caveat: Unresponsive  History of present illness: I was contacted by the hospital for a CODE BLUE cardiac arrest upstairs and presented to the patient's bedside.  Patient currently in the endoscopy suite her were planning endoscopy.  Patient had already been intubated by anesthesia prior to my arrival, compressions were being performed upon my arrival with blood coming out of the mouth.  Per anesthesia patient has a history of a GI bleed.  ROS: Unable to obtain, Level V caveat  Scheduled Meds:  sodium chloride   Intravenous Once   budesonide (PULMICORT) nebulizer solution  0.5 mg Nebulization BID   chlorhexidine gluconate (MEDLINE KIT)  15 mL Mouth Rinse BID   Chlorhexidine Gluconate Cloth  6 each Topical Daily   docusate  100 mg Per Tube BID   fentaNYL (SUBLIMAZE) injection  50 mcg Intravenous Once   ipratropium-albuterol  3 mL Nebulization Q6H   mouth rinse  15 mL Mouth Rinse 10 times per day   polyethylene glycol  17 g Per Tube Daily   sodium bicarbonate       sodium bicarbonate  50 mEq Intravenous Once   Continuous Infusions:  sodium chloride     ampicillin-sulbactam (UNASYN) IV     anidulafungin 100 mg (01/09/21 1747)   desmopressin (DDAVP) IV for Bleeding     fentaNYL infusion INTRAVENOUS 50 mcg/hr (01/10/21 1355)   pantoprazole 8 mg/hr (01/10/21 1040)   phytonadione (VITAMIN K) IV     propofol (DIPRIVAN) infusion     sodium bicarbonate 150 mEq in D5W infusion 150 mL/hr at 01/10/21 1401   PRN Meds:.acetaminophen, fentaNYL, hydrALAZINE, loperamide, ondansetron (ZOFRAN) IV Past Medical History:  Diagnosis Date   Hypertension    Spina bifida ([place])    Past Surgical History:  Procedure Laterality Date   ESOPHAGOGASTRODUODENOSCOPY (EGD) WITH PROPOFOL N/A 01/05/2021   Procedure: ESOPHAGOGASTRODUODENOSCOPY (EGD) WITH PROPOFOL;   Surgeon: Lesly Rubenstein, MD;  Location: [facility] ENDOSCOPY;  Service: Endoscopy;  Laterality: N/A;   TEE WITHOUT CARDIOVERSION N/A 01/08/2021   Procedure: TRANSESOPHAGEAL ECHOCARDIOGRAM (TEE);  Surgeon: Minna Merritts, MD;  Location: [facility] ORS;  Service: Cardiovascular;  Laterality: N/A;   Social History   Socioeconomic History   Marital status: Divorced    Spouse name: Not on file   Number of children: Not on file   Years of education: Not on file   Highest education level: Not on file  Occupational History   Not on file  Tobacco Use   Smoking status: Every Day    Packs/day: 0.50    Types: Cigarettes   Smokeless tobacco: Never  Vaping Use   Vaping Use: Never used  Substance and Sexual Activity   Alcohol use: Never   Drug use: Never   Sexual activity: Not on file  Other Topics Concern   Not on file  Social History Narrative   Not on file   Social Determinants of Health   Financial Resource Strain: Not on file  Food Insecurity: Not on file  Transportation Needs: Not on file  Physical Activity: Not on file  Stress: Not on file  Social Connections: Not on file  Intimate Partner Violence: Not on file   Allergies  Allergen Reactions   Tape Dermatitis    **BLISTERS** **BLISTERS**    Codeine Itching   Hydrogen Peroxide Other (See Comments)    Skin Irritation   Latex Other (  See Comments)    Skin Irritation; blisters     Last set of Vital Signs (not current) Vitals:   01/10/21 1300 01/10/21 1305  BP: 110/84   Pulse:    Resp: (!) 27 (!) 30  Temp:  (!) 95.4 F (35.2 C)  SpO2:        Physical Exam  Gen: unresponsive Cardiovascular: pulseless  Resp: apneic. Breath sounds equal bilaterally with bagging/intubated Abd: Moderately distended abdomen Neuro: GCS 3, unresponsive to pain  HEENT: Blood in pharynx. Neck: No crepitus  Musculoskeletal: Infiltrated IVs in bilateral upper extremities Skin: warm  Procedures (when applicable, including Critical  Care time): CENTRAL LINE Performed by: Harvest Dark Consent: The procedure was performed in an emergent situation. Required items: required blood products, implants, devices, and special equipment available Patient identity confirmed: arm band and provided demographic data Time out: Immediately prior to procedure a "time out" was called to verify the correct patient, procedure, equipment, support staff and site/side marked as required. Indications: vascular access Anesthesia: local infiltration Local anesthetic: lidocaine 1% with epinephrine Anesthetic total: 3 ml Patient sedated: no Preparation: skin prepped with 2% chlorhexidine Skin prep agent dried: skin prep agent completely dried prior to procedure Sterile barriers: all five maximum sterile barriers used - cap, mask, sterile gown, sterile gloves, and large sterile sheet Hand hygiene: hand hygiene performed prior to central venous catheter insertion  Location details: Right femoral  Catheter type: triple lumen Catheter size: 8 Fr Pre-procedure: landmarks identified Ultrasound guidance: Yes Successful placement: yes Post-procedure: line sutured and dressing applied Assessment: blood return through all parts, free fluid flow    MDM / Assessment and Plan Arrived to the patient's endoscopy suite, compressions being performed, patient intubated.  Noted to have blood coming out of mouth, moderately distended abdomen.  Patient had bilateral upper extremity infiltrated IVs with no IV access.  Patient placed on the monitor upon my arrival, appeared to have PEA with no pulse palpated.  Were able to obtain an ultrasound and I was able to place a right femoral triple-lumen catheter as well as a right upper extremity [id number] cephalic vein AV.  Massive transfusion protocol was initialized by anesthesia.  Patient received multiple units of blood, ultrasound showed good heart squeeze, faint pulse with ultrasound.  Anesthesia reports good  end-tidal's and blood pressure greater than 025 systolic.  Patient was ultimately transferred to the intensive care unit.  I spent approximately 45 minutes in the room for resuscitation efforts.  Anesthesiologist MD took over care once access was obtained, blood infusing, airway had already been secured, and patient had return of spontaneous circulation.  CRITICAL CARE Performed by: Harvest Dark   Total critical care time: 45 minutes  Critical care time was exclusive of separately billable procedures and treating other patients.  Critical care was necessary to treat or prevent imminent or life-threatening deterioration.  Critical care was time spent personally by me on the following activities: development of treatment plan with patient and/or surrogate as well as nursing, discussions with consultants, evaluation of patient's response to treatment, examination of patient, obtaining history from patient or surrogate, ordering and performing treatments and interventions, ordering and review of laboratory studies, ordering and review of radiographic studies, pulse oximetry and re-evaluation of patient's condition.     Harvest Dark, MD 01/10/21 [phone]

## 2021-01-10 NOTE — OR Nursing (Signed)
Procedure completed and pt had to be intubated. See crna notes and dr Terri Skains notes. P t  started bleeding even though we used hemo-spray and epinephrine. See code cart sheets cpr started . RT FEMORAL LINE INSERTED . Blood adm. In massive quanites. See sheets . 1SHOCK ADM. Patiient stabilized with interventions and was transported to ICU. Dr Norma Fredrickson called pt sister to let her know of the move and  the change in  her condition.

## 2021-01-11 ENCOUNTER — Inpatient Hospital Stay: Payer: Medicaid Other

## 2021-01-11 ENCOUNTER — Encounter: Payer: Self-pay | Admitting: Internal Medicine

## 2021-01-11 DIAGNOSIS — Z9911 Dependence on respirator [ventilator] status: Secondary | ICD-10-CM

## 2021-01-11 DIAGNOSIS — Z978 Presence of other specified devices: Secondary | ICD-10-CM | POA: Diagnosis not present

## 2021-01-11 DIAGNOSIS — T8501XA Breakdown (mechanical) of ventricular intracranial (communicating) shunt, initial encounter: Secondary | ICD-10-CM | POA: Diagnosis not present

## 2021-01-11 DIAGNOSIS — T859XXA Unspecified complication of internal prosthetic device, implant and graft, initial encounter: Secondary | ICD-10-CM

## 2021-01-11 DIAGNOSIS — J9600 Acute respiratory failure, unspecified whether with hypoxia or hypercapnia: Secondary | ICD-10-CM

## 2021-01-11 DIAGNOSIS — E872 Acidosis, unspecified: Secondary | ICD-10-CM

## 2021-01-11 DIAGNOSIS — B49 Unspecified mycosis: Secondary | ICD-10-CM

## 2021-01-11 DIAGNOSIS — K254 Chronic or unspecified gastric ulcer with hemorrhage: Secondary | ICD-10-CM | POA: Diagnosis not present

## 2021-01-11 DIAGNOSIS — R578 Other shock: Secondary | ICD-10-CM | POA: Diagnosis not present

## 2021-01-11 DIAGNOSIS — E876 Hypokalemia: Secondary | ICD-10-CM

## 2021-01-11 DIAGNOSIS — D500 Iron deficiency anemia secondary to blood loss (chronic): Secondary | ICD-10-CM | POA: Diagnosis not present

## 2021-01-11 DIAGNOSIS — B377 Candidal sepsis: Secondary | ICD-10-CM

## 2021-01-11 DIAGNOSIS — I469 Cardiac arrest, cause unspecified: Secondary | ICD-10-CM

## 2021-01-11 LAB — BLOOD GAS, ARTERIAL
Acid-Base Excess: 7.5 mmol/L — ABNORMAL HIGH (ref 0.0–2.0)
Bicarbonate: 32.7 mmol/L — ABNORMAL HIGH (ref 20.0–28.0)
FIO2: 50
MECHVT: 300 mL
Mechanical Rate: 26
O2 Saturation: 97.4 %
PEEP: 5 cmH2O
Patient temperature: 37
pCO2 arterial: 46 mmHg (ref 32.0–48.0)
pH, Arterial: 7.46 — ABNORMAL HIGH (ref 7.350–7.450)
pO2, Arterial: 90 mmHg (ref 83.0–108.0)

## 2021-01-11 LAB — TRIGLYCERIDES: Triglycerides: 91 mg/dL (ref <150)

## 2021-01-11 LAB — CBC
HCT: 40.9 % (ref 36.0–46.0)
Hemoglobin: 14.4 g/dL (ref 12.0–15.0)
MCH: 27.9 pg (ref 26.0–34.0)
MCHC: 35.2 g/dL (ref 30.0–36.0)
MCV: 79.1 fL — ABNORMAL LOW (ref 80.0–100.0)
Platelets: 103 10*3/uL — ABNORMAL LOW (ref 150–400)
RBC: 5.17 MIL/uL — ABNORMAL HIGH (ref 3.87–5.11)
RDW: 16.9 % — ABNORMAL HIGH (ref 11.5–15.5)
WBC: 23.8 10*3/uL — ABNORMAL HIGH (ref 4.0–10.5)
nRBC: 0.3 % — ABNORMAL HIGH (ref 0.0–0.2)

## 2021-01-11 LAB — BASIC METABOLIC PANEL
Anion gap: 8 (ref 5–15)
BUN: 12 mg/dL (ref 6–20)
CO2: 29 mmol/L (ref 22–32)
Calcium: 6.3 mg/dL — CL (ref 8.9–10.3)
Chloride: 102 mmol/L (ref 98–111)
Creatinine, Ser: 0.56 mg/dL (ref 0.44–1.00)
GFR, Estimated: >60 mL/min (ref >60)
Glucose, Bld: 121 mg/dL — ABNORMAL HIGH (ref 70–99)
Potassium: 2.5 mmol/L — CL (ref 3.5–5.1)
Sodium: 139 mmol/L (ref 135–145)

## 2021-01-11 LAB — MAGNESIUM
Magnesium: 1.3 mg/dL — ABNORMAL LOW (ref 1.7–2.4)
Magnesium: 2.9 mg/dL — ABNORMAL HIGH (ref 1.7–2.4)

## 2021-01-11 LAB — GLUCOSE, CAPILLARY
Glucose-Capillary: 110 mg/dL — ABNORMAL HIGH (ref 70–99)
Glucose-Capillary: 114 mg/dL — ABNORMAL HIGH (ref 70–99)
Glucose-Capillary: 107 mg/dL — ABNORMAL HIGH (ref 70–99)

## 2021-01-11 LAB — PHOSPHORUS: Phosphorus: 4.2 mg/dL (ref 2.5–4.6)

## 2021-01-11 LAB — CALCIUM: Calcium: 6.9 mg/dL — ABNORMAL LOW (ref 8.9–10.3)

## 2021-01-11 LAB — POTASSIUM: Potassium: 4.3 mmol/L (ref 3.5–5.1)

## 2021-01-11 LAB — HEMOGLOBIN AND HEMATOCRIT, BLOOD
HCT: 41.9 % (ref 36.0–46.0)
Hemoglobin: 14.6 g/dL (ref 12.0–15.0)

## 2021-01-11 MED ORDER — MAGNESIUM SULFATE 2 GM/50ML IV SOLN
2.0000 g | Freq: Once | INTRAVENOUS | Status: AC
  Administered 2021-01-11: 2 g via INTRAVENOUS
  Filled 2021-01-11: qty 50

## 2021-01-11 MED ORDER — MAGNESIUM SULFATE 4 GM/100ML IV SOLN
4.0000 g | Freq: Once | INTRAVENOUS | Status: AC
  Administered 2021-01-11: 4 g via INTRAVENOUS
  Filled 2021-01-11: qty 100

## 2021-01-11 MED ORDER — CALCIUM GLUCONATE-NACL 1-0.675 GM/50ML-% IV SOLN
1.0000 g | Freq: Once | INTRAVENOUS | Status: AC
  Administered 2021-01-11: 1000 mg via INTRAVENOUS
  Filled 2021-01-11: qty 50

## 2021-01-11 MED ORDER — POTASSIUM CHLORIDE 10 MEQ/50ML IV SOLN
10.0000 meq | INTRAVENOUS | Status: AC
  Administered 2021-01-11 (×8): 10 meq via INTRAVENOUS
  Filled 2021-01-11 (×8): qty 50

## 2021-01-11 MED ORDER — CALCIUM GLUCONATE-NACL 2-0.675 GM/100ML-% IV SOLN
2.0000 g | Freq: Once | INTRAVENOUS | Status: AC
  Administered 2021-01-11: 2000 mg via INTRAVENOUS
  Filled 2021-01-11: qty 100

## 2021-01-11 NOTE — Progress Notes (Signed)
[place] INF[facility] PROGRESS NOTE Date of Admission:  01/04/2021     ID: Carolyn Herring is a 45 y.o. female with  Candidemia Active Problems:   Hemorrhagic shock ([place])   Fall   Altered mental status   History of brain shunt   Pressure injury of skin   Subjective: She had recurrent bleeding.  Her hemoglobin dropped from 9.7-5.9 on the 15th.  She underwent repeat EGD but coded.   She was transferred back to the ICU.  Now intubated. Surgery was consulted in case he will need to do surgery. ROS unable to obtain Medications:  Antibiotics Given (last 72 hours)     Date/Time Action Medication Dose Rate   01/10/21 1816 New Bag/Given   Ampicillin-Sulbactam (UNASYN) 3 g in sodium chloride 0.9 % 100 mL IVPB 3 g 200 mL/hr   01/11/21 0010 New Bag/Given   Ampicillin-Sulbactam (UNASYN) 3 g in sodium chloride 0.9 % 100 mL IVPB 3 g 200 mL/hr   01/11/21 0920 New Bag/Given   Ampicillin-Sulbactam (UNASYN) 3 g in sodium chloride 0.9 % 100 mL IVPB 3 g 200 mL/hr       sodium chloride   Intravenous Once   budesonide (PULMICORT) nebulizer solution  0.5 mg Nebulization BID   chlorhexidine gluconate (MEDLINE KIT)  15 mL Mouth Rinse BID   Chlorhexidine Gluconate Cloth  6 each Topical Daily   docusate  100 mg Per Tube BID   fentaNYL (SUBLIMAZE) injection  50 mcg Intravenous Once   ipratropium-albuterol  3 mL Nebulization Q6H   mouth rinse  15 mL Mouth Rinse 10 times per day   polyethylene glycol  17 g Per Tube Daily    Objective: Vital signs in last 24 hours: Temp:  [95 F (35 C)-99.9 F (37.7 C)] 99.7 F (37.6 C) (10/17 1330) Pulse Rate:  [75-116] 89 (10/17 1330) Resp:  [21-30] 26 (10/17 1330) BP: (87-128)/(52-96) 87/60 (10/17 1200) SpO2:  [96 %-100 %] 98 % (10/17 1330) Arterial Line BP: (85-139)/(43-83) 95/52 (10/17 1330) FiO2 (%):  [40 %-100 %] 40 % (10/17 1115) Constitutional:  intubated, sedated  Mouth/Throat: ett Cardiovascular: Normal rate, regular rhythm and normal  heart sounds.  Pulmonary/Chest: mech breath sounds Neck = supple, no nuchal rigidity Abdominal: Soft. Bowel sounds are normal.  Neurological: intubated sedated extremeties. Some contractures on hands Skin: Skin is warm and dry.  Psychiatric: sedated Lab Results Recent Labs    01/10/21 1315 01/10/21 1337 01/10/21 1705 01/10/21 2222 01/11/21 0512  WBC  --  38.5*  --   --  23.8*  HGB  --  10.5*   < > 14.5 14.4  HCT  --  31.0*   < > 39.8 40.9  NA 138  --   --   --  139  K 4.1  --   --   --  2.5*  CL 109  --   --   --  102  CO2 16*  --   --   --  29  BUN 19  --   --   --  12  CREATININE 0.89  --   --   --  0.56   < > = values in this interval not displayed.    Microbiology: Results for orders placed or performed during the hospital encounter of 01/04/21  Resp Panel by RT-PCR (Flu A&B, Covid) Nasopharyngeal Swab     Status: None   Collection Time: 01/04/21 12:05 PM   Specimen: Nasopharyngeal Swab; Nasopharyngeal(NP) swabs in vial transport  medium  Result Value Ref Range Status   SARS Coronavirus 2 by RT PCR NEGATIVE NEGATIVE Final    Comment: (NOTE) SARS-CoV-2 target nucleic acids are NOT DETECTED.  The SARS-CoV-2 RNA is generally detectable in upper respiratory specimens during the acute phase of infection. The lowest concentration of SARS-CoV-2 viral copies this assay can detect is 138 copies/mL. A negative result does not preclude SARS-Cov-2 infection and should not be used as the sole basis for treatment or other patient management decisions. A negative result may occur with  improper specimen collection/handling, submission of specimen other than nasopharyngeal swab, presence of viral mutation(s) within the areas targeted by this assay, and inadequate number of viral copies(<138 copies/mL). A negative result must be combined with clinical observations, patient history, and epidemiological information. The expected result is Negative.  Fact Sheet for Patients:   EntrepreneurPulse.com.au  Fact Sheet for Healthcare Providers:  IncredibleEmployment.be  This test is no t yet approved or cleared by the [place] FDA and  has been authorized for detection and/or diagnosis of SARS-CoV-2 by FDA under an Emergency Use Authorization (EUA). This EUA will remain  in effect (meaning this test can be used) for the duration of the COVID-19 declaration under Section 564(b)(1) of the Act, 21 U.S.C.section 360bbb-3(b)(1), unless the authorization is terminated  or revoked sooner.       Influenza A by PCR NEGATIVE NEGATIVE Final   Influenza B by PCR NEGATIVE NEGATIVE Final    Comment: (NOTE) The Xpert Xpress SARS-CoV-2/FLU/RSV plus assay is intended as an aid in the diagnosis of influenza from Nasopharyngeal swab specimens and should not be used as a sole basis for treatment. Nasal washings and aspirates are unacceptable for Xpert Xpress SARS-CoV-2/FLU/RSV testing.  Fact Sheet for Patients: EntrepreneurPulse.com.au  Fact Sheet for Healthcare Providers: IncredibleEmployment.be  This test is not yet approved or cleared by the [place] FDA and has been authorized for detection and/or diagnosis of SARS-CoV-2 by FDA under an Emergency Use Authorization (EUA). This EUA will remain in effect (meaning this test can be used) for the duration of the COVID-19 declaration under Section 564(b)(1) of the Act, 21 U.S.C. section 360bbb-3(b)(1), unless the authorization is terminated or revoked.  Performed at [facility], [address] [place], [place] [address]   Blood culture (routine single)     Status: Abnormal (Preliminary result)   Collection Time: 01/04/21  2:58 PM   Specimen: BLOOD  Result Value Ref Range Status   Specimen Description   Final    BLOOD LEFT ANTECUBITAL Performed at [facility], [address], [place], [place] [address]    Special  Requests   Final    BOTTLES DRAWN AEROBIC AND ANAEROBIC Blood Culture adequate volume Performed at [facility], [address] [place], [place] [address]    Culture  Setup Time   Final    YEAST ANAEROBIC BOTTLE ONLY Organism ID to follow CRITICAL RESULT CALLED TO, READ BACK BY AND VERIFIED WITH: SAMANTHA RAUER 01/05/21 1507 SLM IN BOTH AEROBIC AND ANAEROBIC BOTTLES Performed at [facility], [address] [place], [place] [address]    Culture (A)  Final    CANDIDA KRUSEI Sent to [place] for further susceptibility testing. Performed at [facility], [place] [address], [place], [place] [address]    Report Status PENDING  Incomplete  Blood Culture ID Panel (Reflexed)     Status: Abnormal   Collection Time: 01/04/21  2:58 PM  Result Value Ref Range Status  Enterococcus faecalis NOT DETECTED NOT DETECTED Final   Enterococcus Faecium NOT DETECTED NOT DETECTED Final   Listeria monocytogenes NOT DETECTED NOT DETECTED Final   Staphylococcus species NOT DETECTED NOT DETECTED Final   Staphylococcus aureus (BCID) NOT DETECTED NOT DETECTED Final   Staphylococcus epidermidis NOT DETECTED NOT DETECTED Final   Staphylococcus lugdunensis NOT DETECTED NOT DETECTED Final   Streptococcus species NOT DETECTED NOT DETECTED Final   Streptococcus agalactiae NOT DETECTED NOT DETECTED Final   Streptococcus pneumoniae NOT DETECTED NOT DETECTED Final   Streptococcus pyogenes NOT DETECTED NOT DETECTED Final   A.calcoaceticus-baumannii NOT DETECTED NOT DETECTED Final   Bacteroides fragilis NOT DETECTED NOT DETECTED Final   Enterobacterales NOT DETECTED NOT DETECTED Final   Enterobacter cloacae complex NOT DETECTED NOT DETECTED Final   Escherichia coli NOT DETECTED NOT DETECTED Final   Klebsiella aerogenes NOT DETECTED NOT DETECTED Final   Klebsiella oxytoca NOT DETECTED NOT DETECTED Final   Klebsiella pneumoniae NOT DETECTED NOT DETECTED Final   Proteus species NOT DETECTED NOT  DETECTED Final   Salmonella species NOT DETECTED NOT DETECTED Final   Serratia marcescens NOT DETECTED NOT DETECTED Final   Haemophilus influenzae NOT DETECTED NOT DETECTED Final   Neisseria meningitidis NOT DETECTED NOT DETECTED Final   Pseudomonas aeruginosa NOT DETECTED NOT DETECTED Final   Stenotrophomonas maltophilia NOT DETECTED NOT DETECTED Final   Candida albicans NOT DETECTED NOT DETECTED Final   Candida auris NOT DETECTED NOT DETECTED Final   Candida glabrata NOT DETECTED NOT DETECTED Final   Candida krusei DETECTED (A) NOT DETECTED Final    Comment: CRITICAL RESULT CALLED TO, READ BACK BY AND VERIFIED WITH: SAMANTHA RAUER 01/05/21 1507 SLM    Candida parapsilosis NOT DETECTED NOT DETECTED Final   Candida tropicalis NOT DETECTED NOT DETECTED Final   Cryptococcus neoformans/gattii NOT DETECTED NOT DETECTED Final    Comment: Performed at [facility], [address] [place], [place] [address]  Antifungal AST 9 Drug Panel     Status: None (Preliminary result)   Collection Time: 01/04/21  2:58 PM  Result Value Ref Range Status   Organism ID, Yeast Preliminary report  Final    Comment: (NOTE) Specimen has been received and testing has been initiated. Performed At: [facility] [place], [place] [id number] Rush Farmer MD TS:[id number]    Amphotericin B MIC PENDING  Incomplete   Anidulafungin MIC PENDING  Incomplete   Caspofungin MIC PENDING  Incomplete   Micafungin MIC PENDING  Incomplete   Posaconazole MIC PENDING  Incomplete   Fluconazole Islt MIC PENDING  Incomplete   Flucytosine MIC PENDING  Incomplete   Itraconazole MIC PENDING  Incomplete   Voriconazole MIC PENDING  Incomplete   Source [id number] C.KRUSEI 9 DRUGS FUNGAL PANEL BLOOD CULTURE  Final    Comment: Performed at [facility], [place] [address] [place], [place] [address]  MRSA Next Gen by PCR, Nasal     Status: None   Collection Time: 01/04/21  4:58 PM   Specimen: Nasal  Mucosa; Nasal Swab  Result Value Ref Range Status   MRSA by PCR Next Gen NOT DETECTED NOT DETECTED Final    Comment: (NOTE) The GeneXpert MRSA Assay (FDA approved for NASAL specimens only), is one component of a comprehensive MRSA colonization surveillance program. It is not intended to diagnose MRSA infection nor to guide or monitor treatment for MRSA infections. Test performance is not FDA approved in patients less than 17 years old. Performed at [facility], [address], [place], [place] [address]   Urine Culture     Status: Abnormal   Collection Time: 01/04/21  8:50 PM   Specimen: Urine, Clean Catch  Result Value Ref Range Status   Specimen Description   Final    URINE, CLEAN CATCH Performed at [facility], [address] [place], [place] [address]    Special Requests   Final    NONE Performed at [facility], [address], [place], [place] [address]    Culture (A)  Final    <10,000 COLONIES/mL INSIGNIFICANT GROWTH Performed at [facility], [place] [address], [place], [place] [address]    Report Status 01/07/2021 FINAL  Final  Gastrointestinal Panel by PCR , Stool     Status: None   Collection Time: 01/05/21  3:01 PM   Specimen: Stool  Result Value Ref Range Status   Campylobacter species NOT DETECTED NOT DETECTED Final   Plesimonas shigelloides NOT DETECTED NOT DETECTED Final   Salmonella species NOT DETECTED NOT DETECTED Final   Yersinia enterocolitica NOT DETECTED NOT DETECTED Final   Vibrio species NOT DETECTED NOT DETECTED Final   Vibrio cholerae NOT DETECTED NOT DETECTED Final   Enteroaggregative E coli (EAEC) NOT DETECTED NOT DETECTED Final   Enteropathogenic E coli (EPEC) NOT DETECTED NOT DETECTED Final   Enterotoxigenic E coli (ETEC) NOT DETECTED NOT DETECTED Final   Shiga like toxin producing E coli (STEC) NOT DETECTED NOT DETECTED Final   Shigella/Enteroinvasive E coli (EIEC) NOT DETECTED NOT DETECTED Final    Cryptosporidium NOT DETECTED NOT DETECTED Final   Cyclospora cayetanensis NOT DETECTED NOT DETECTED Final   Entamoeba histolytica NOT DETECTED NOT DETECTED Final   Giardia lamblia NOT DETECTED NOT DETECTED Final   Adenovirus F40/41 NOT DETECTED NOT DETECTED Final   Astrovirus NOT DETECTED NOT DETECTED Final   Norovirus GI/GII NOT DETECTED NOT DETECTED Final   Rotavirus A NOT DETECTED NOT DETECTED Final   Sapovirus (I, II, IV, and V) NOT DETECTED NOT DETECTED Final    Comment: Performed at [facility], [address] [place], [place] [address]  C Difficile Quick Screen w PCR reflex     Status: Abnormal   Collection Time: 01/05/21  3:01 PM   Specimen: STOOL  Result Value Ref Range Status   C Diff antigen POSITIVE (A) NEGATIVE Final   C Diff toxin NEGATIVE NEGATIVE Final   C Diff interpretation Results are indeterminate. See PCR results.  Final    Comment: Performed at [facility], [address] [place], [place] [address]  C. Diff by PCR, Reflexed     Status: None   Collection Time: 01/05/21  3:01 PM  Result Value Ref Range Status   Toxigenic C. Difficile by PCR NEGATIVE NEGATIVE Final    Comment: Patient is colonized with non toxigenic C. difficile. May not need treatment unless significant symptoms are present. Performed at [facility], [address] [place], [place] [address]   Culture, blood (single)     Status: None   Collection Time: 01/05/21  4:42 PM   Specimen: BLOOD  Result Value Ref Range Status   Specimen Description BLOOD BLOOD LEFT HAND  Final   Special Requests   Final    BOTTLES DRAWN AEROBIC AND ANAEROBIC Blood Culture adequate volume   Culture   Final    NO GROWTH 5 DAYS Performed at [facility], [address], [place],  [address]    Report  Status 01/10/2021 FINAL  Final  CSF culture w Gram Stain     Status: None   Collection Time: 01/07/21  2:43 PM   Specimen: PATH Cytology CSF; Cerebrospinal Fluid  Result  Value Ref Range Status   Specimen Description   Final    CSF Performed at [facility], [address] [place], [place] [address]    Special Requests   Final    NONE Performed at [facility], [address] [place], [place] [address]    Gram Stain   Final    NO ORGANISMS SEEN WBC PRESENT, PREDOMINANTLY MONONUCLEAR CRITICAL RESULT CALLED TO, READ BACK BY AND VERIFIED WITH: Delphina Cahill, RN AT [phone] 01/07/21 BY JRH    Culture   Final    NO GROWTH 3 DAYS Performed at [facility], [place] [address], [place], [place] [address]    Report Status 01/10/2021 FINAL  Final  Fungus Culture With Stain     Status: None (Preliminary result)   Collection Time: 01/07/21  2:43 PM   Specimen: PATH Cytology CSF; Cerebrospinal Fluid  Result Value Ref Range Status   Fungus Stain Final report  Final    Comment: (NOTE) Performed At: [facility] [place], [place] [id number] Rush Farmer MD UK:[id number]    Fungus (Mycology) Culture PENDING  Incomplete   Fungal Source CSF  Final    Comment: Performed at [facility], [address] [place], [place] [address]  Fungus Culture Result     Status: None   Collection Time: 01/07/21  2:43 PM  Result Value Ref Range Status   Result 1 Comment  Final    Comment: (NOTE) KOH/Calcofluor preparation:  no fungus observed. Performed At: [facility] [place], [place] [id number] Rush Farmer MD PX:[id number]     Studies/Results: DG Chest Port 1 View  Result Date: 01/11/2021 CLINICAL DATA:  Post cardiac arrest, intubated EXAM: PORTABLE CHEST 1 VIEW COMPARISON:  01/10/2021 FINDINGS: Endotracheal tube is 2.6 cm above the carina. Temperature probe within the esophagus. Partially imaged shunt catheter traversing the right neck, chest, and abdomen. Persistent patchy bilateral opacities with worsening aeration at the right lung base. Probable small right pleural effusion. No  pneumothorax. Stable cardiomegaly. IMPRESSION: Lines and tubes as above. Persistent patchy bilateral opacities with worsening of right lung base aeration. May reflect edema or pneumonia. Electronically Signed   By: Macy Mis M.D.   On: 01/11/2021 08:30   Portable Chest x-ray  Result Date: 01/10/2021 CLINICAL DATA:  Endotracheal tube placement EXAM: PORTABLE CHEST 1 VIEW COMPARISON:  01/04/2021 CT.  01/04/2021 plain film. FINDINGS: Endotracheal tube terminates 2.5 cm above carina. Esophageal thermometer. Right-sided VP shunt catheter. Cardiomegaly accentuated by AP portable technique. Pacer projects over left chest. No pleural effusion or pneumothorax. Development of diffuse interstitial and airspace disease since 01/04/2021. Somewhat more confluent in the right upper lobe. IMPRESSION: Endotracheal tube 2.6 cm above the carina. Development of diffuse interstitial and airspace disease, at least partially felt to be secondary to pulmonary edema. Concurrent infection or aspiration cannot be excluded. Electronically Signed   By: Abigail Miyamoto M.D.   On: 01/10/2021 13:54    Assessment/Plan: Carolyn Herring is a 45 y.o. 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 Admitted with falls, weakness and found to have profound anemia. Had large ulcer from NSAIDS noted on EGD. Initially WBC 30  but no fevers. BCX neg initially but now growing Candida krusei. She seems to be back to her baseline mentally. She has CT with possible subacute infarct and possible small SDH.  She denies any recent shunt issues. Unclear if she is neurologically at her baseline.    The candidemia source is most likely the gastric ulcer and translocation from the gut.  I am not convinced the shunt is infected as no peritonitis, no obvious change in her mental status. LP shows 20 wbc and elevated protein reviewed with neurosurgery and still not really  indicative of infection so will monitor. TEE neg for endocarditis  October 17 had recurrent bleeding and is back in  ICU.  White blood count had increased to 38.5 but is now down to 23.  She was restarted on Unasyn.  CSF cultures remain negative  Recommendations Cont eraxis. Sensis pending.  Restarted  unasyn for Aspiration PNA. Cont for 7- 10 day course    Thank you very much for the consult. Will follow with you.  Leonel Ramsay   01/11/2021, 2:00 PM

## 2021-01-11 NOTE — Progress Notes (Signed)
 NAME:  Carolyn Herring, MRN:  [id number], DOB:  10/12/1975, LOS: 7 ADMISSION DATE:  01/04/2021 BRIEF SYNOPSIS 45 yo female with a PMH of HTN, Hydrocephalus, and 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 last revised in 2019.  She has not seen a neurosurgeon in 3 yrs.  She presented to [facility] ER on 10/10 via EMS from home with recurrent falls, generalized weakness, and back pain.  She is unsure if she has been hitting her head during the falls.  She also endorsed several week hx of abdominal pain and diarrhea.  She was diagnosed with COVID-19 in 08/2020, and has had worsening generalized weakness since the dx.  She has not seen a PCP for evaluation of symptoms.  She does live alone, but her aunt lives next door.  On 10/10 pt able to notify EMS for assistance, however when they arrived at her home she was unable to answer the door.  Therefore,  EMS had to break her door down to gain entry.  Hospital course Was admitted and subsequently resuscitated with blood transfusions and IV fluids and was able to transfer to the regular medical floor.  Her encephalopathy cleared.  +FUNGEMIA She underwent upper endoscopy on 05 January 2021 that revealed a very large antral ulcer with adherent clot.  Clot was not removed.  The ulcer description was that of a giant ulcer.  It was believed the etiology was from the patient grinding NSAIDs to ingest them for control of pain.  She apparently stabilized after this initial EGD procedure and no further drop in H&H was noted.  She then underwent a TEE with anesthesia monitoring and did not have difficulties through that.  However yesterday it appeared that she had melenic stools and her H&H did have a significant drop.  GI was then reconsulted and EGD was planned for today.  The patient again was noted to have a large gastric ulcer it was noted to be bleeding actively and various methodologies were utilized to attempt control however the patient  developed PEA arrest and respiratory distress after aspiration of blood.  The patient underwent massive transfusion protocol.  CODE BLUE situation ensued the patient was resuscitated.  She was transferred to the intensive care unit intubated and mechanically ventilated.  Patient received 4 units of blood during resuscitation.  She has also required fresh frozen plasma and platelets transfusion.  Currently requiring pressors.  She is profoundly acidotic.  Intubated and unresponsive on the vent.  PCCM consulted to assume care while patient is in ICU.   Significant Hospital Events: Including procedures, antibiotic start and stop dates in addition to other pertinent events   10/10: Pt admitted to ICU with acute encephalopathy concerning for acute CVA, hemorrhagic shock, possible sepsis, and acute hypoxic respiratory failure secondary to possible aspiration pneumonia  10/16: readmit to ICU with hemorrhagic shock S/P PEA arrest during endoscopy      Micro Data:  Respiratory Panel by RT-PCR 10/10>>negative  Blood 10/10>>negative  CSF 10/13>>  Antimicrobials:  Zosyn 10/10 x1 dose Unasyn 10/10>>     Interim History / Subjective:  Severe shock with severe resp failure +GIB Remains critically ill         Objective   Blood pressure (!) 91/58, pulse 89, temperature 99.7 F (37.6 C), resp. rate (!) 26, height 5' (1.524 m), weight 54.3 kg, SpO2 97 %.    Vent Mode: PRVC FiO2 (%):  [50 %-100 %] 50 % Set Rate:  [26 bmp-30   bmp] 26 bmp Vt Set:  [300 mL] 300 mL PEEP:  [5 cmH20] 5 cmH20 Plateau Pressure:  [20 cmH20-26 cmH20] 26 cmH20   Intake/Output Summary (Last 24 hours) at 01/11/2021 0751 Last data filed at 01/11/2021 0300 Gross per 24 hour  Intake 7147.63 ml  Output 900 ml  Net 6247.63 ml   Filed Weights   01/08/21 0703 01/09/21 0500 01/10/21 0500  Weight: 50.9 kg 54.2 kg 54.3 kg      REVIEW OF SYSTEMS  PATIENT IS UNABLE TO PROVIDE COMPLETE REVIEW OF SYSTEMS DUE TO SEVERE  CRITICAL ILLNESS AND TOXIC METABOLIC ENCEPHALOPATHY  ALL OTHER ROS ARE NEGATIVE   PHYSICAL EXAMINATION:  GENERAL:critically ill appearing, +resp distress EYES: Pupils equal, round, reactive to light.  No scleral icterus.  MOUTH: Moist mucosal membrane. INTUBATED NECK: Supple.  PULMONARY: +rhonchi, +wheezing CARDIOVASCULAR: S1 and S2.  No murmurs  GASTROINTESTINAL: Soft, nontender, -distended. Positive bowel sounds.  MUSCULOSKELETAL: No swelling, clubbing, or edema.  NEUROLOGIC: obtunded SKIN:intact,warm,dry    Labs/imaging that I havepersonally reviewed  (right click and "Reselect all SmartList Selections" daily)        ASSESSMENT AND PLAN SYNOPSIS  45 yo female initially admitted for fungemia with infected shunt found to have bleeding ulcer now with severe resp failure from acute cardiac arrest and hemorrhagic shock due to massive GIB   Severe ACUTE Hypoxic and Hypercapnic Respiratory Failure -continue Mechanical Ventilator support -continue Bronchodilator Therapy -Wean Fio2 and PEEP as tolerated -VAP/VENT bundle implementation -will perform SAT/SBT when respiratory parameters are met  Vent Mode: PRVC FiO2 (%):  [50 %-100 %] 50 % Set Rate:  [26 bmp-30 bmp] 26 bmp Vt Set:  [300 mL] 300 mL PEEP:  [5 cmH20] 5 cmH20 Plateau Pressure:  [20 cmH20-26 cmH20] 26 cmH20    ACUTE CARDIAC FAILURE-  S/P PEA arrest D/T 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 Vent support   SEVERE COPD EXACERBATION -continue IV steroids as prescribed -continue NEB THERAPY as prescribed  CARDIAC ICU monitoring   NEUROLOGY Acute toxic metabolic encephalopathy, need for sedation Goal RASS -2 to -3 Wean sedation and assess neuro status  HYPOVOLUMIC SHOCK SOURCE-GIB -use vasopressors to keep MAP>65 as needed -follow ABG and LA -follow up cultures -emperic ABX  INFECTIOUS DISEASE -continue antibiotics as prescribed -follow up cultures -follow up ID  consultation  ENDO - ICU hypoglycemic\Hyperglycemia protocol -check FSBS per protocol   GI GI PROPHYLAXIS as indicated  NUTRITIONAL STATUS DIET-->TF's as tolerated Constipation protocol as indicated   ELECTROLYTES -follow labs as needed -replace as needed -pharmacy consultation and following     Best practice (right click and "Reselect all SmartList Selections" daily)  Diet:  NPO Pain/Anxiety/Delirium protocol (if indicated): Yes (RASS goal -2) VAP protocol (if indicated): Yes DVT prophylaxis: Contraindicated GI prophylaxis: PPI Glucose control:  Basal insulin No Central venous access:  Yes, and it is still needed Arterial line:  Yes, and it is still needed Foley:  Yes, and it is still needed Mobility:  bed rest  Code Status:  FULL CODE Disposition: ICU  Labs   CBC: Recent Labs  Lab 01/04/21 1205 01/04/21 1458 01/08/21 0514 01/09/21 0438 01/09/21 0802 01/10/21 0506 01/10/21 1307 01/10/21 1337 01/10/21 1705 01/10/21 2222 01/11/21 0512  WBC 30.2*   < > 21.4* 22.0*  --  33.7*  --  38.5*  --   --  23.8*  NEUTROABS 27.0*  --   --   --   --   --   --  26.4*  --   --   --  HGB 2.7*   < > 9.7* 5.9*   < > 9.8* 10.3* 10.5* 14.3 14.5 14.4  HCT 10.4*   < > 30.6* 19.0*   < > 29.4* 31.3* 31.0* 40.3 39.8 40.9  MCV 65.0*   < > 83.8 85.6  --  85.5  --  91.4  --   --  79.1*  PLT 721*   < > 253 244  --  150 84* 84*  --   --  103*   < > = values in this interval not displayed.    Basic Metabolic Panel: Recent Labs  Lab 01/05/21 0540 01/06/21 0415 01/07/21 0455 01/08/21 0514 01/09/21 0438 01/10/21 0506 01/10/21 1315 01/11/21 0512  NA 138   < > 136 136 136 136 138 139  K 4.1   < > 4.2 3.9 3.6 3.6 4.1 2.5*  CL 107   < > 104 107 105 108 109 102  CO2 21*   < > _0 21* 16* 29  GLUCOSE 122*   < > 110* 115* 95 96 341* 121*  BUN 21*   < > 15 13 21* 22* 19 12  CREATININE 0.75   < > 0.62 0.56 0.66 0.52 0.89 0.56  CALCIUM 8.1*   < > 8.1* 8.3* 7.8* 7.6* 6.6* 6.3*   MG 2.0  --  2.0 2.0 1.9 1.7  --  1.3*  PHOS 2.9  --  2.9  --   --   --   --  4.2   < > = values in this interval not displayed.   GFR: Estimated Creatinine Clearance: 64.5 mL/min (by C-G formula based on SCr of 0.56 mg/dL). Recent Labs  Lab 01/04/21 1205 01/04/21 1547 01/05/21 0538 01/05/21 0540 01/06/21 0415 01/07/21 0455 01/09/21 0438 01/10/21 0506 01/10/21 1337 01/11/21 0512  PROCALCITON 24.93  --  10.66  --   --   --   --   --   --   --   WBC 30.2*  --   --  24.2* 18.7*   < > 22.0* 33.7* 38.5* 23.8*  LATICACIDVEN 3.6* 2.5*  --  2.1* 1.2  --   --   --   --   --    < > = values in this interval not displayed.    Liver Function Tests: Recent Labs  Lab 01/04/21 1205  AST 20  ALT 13  ALKPHOS 82  BILITOT 0.6  PROT 5.5*  ALBUMIN 2.1*   Recent Labs  Lab 01/04/21 1205  LIPASE 50   No results for input(s): AMMONIA in the last 168 hours.  ABG    Component Value Date/Time   PHART 7.46 (H) 01/11/2021 0512   PCO2ART 46 01/11/2021 0512   PO2ART 90 01/11/2021 0512   HCO3 32.7 (H) 01/11/2021 0512   ACIDBASEDEF 7.3 (H) 01/10/2021 1532   O2SAT 97.4 01/11/2021 0512     Coagulation Profile: Recent Labs  Lab 01/04/21 1205 01/04/21 1547 01/04/21 1915 01/05/21 0540 01/10/21 1307  INR 1.2 1.2 1.1 1.1 1.9*    Cardiac Enzymes: Recent Labs  Lab 01/04/21 1205  CKTOTAL 85    HbA1C: Hgb A1c MFr Bld  Date/Time Value Ref Range Status  01/05/2021 05:40 AM 5.4 4.8 - 5.6 % Final    Comment:    (NOTE)         Prediabetes: 5.7 - 6.4         Diabetes: >6.4         Glycemic control for adults  with diabetes: <7.0     CBG: Recent Labs  Lab 01/04/21 1704 01/10/21 0348 01/10/21 1559 01/11/21 0739  GLUCAP 100* 95 293* 110*    Allergies Allergies  Allergen Reactions   Tape Dermatitis    **BLISTERS** **BLISTERS**    Codeine Itching   Hydrogen Peroxide Other (See Comments)    Skin Irritation   Latex Other (See Comments)    Skin Irritation; blisters         DVT/GI PRX  assessed I Assessed the need for Labs I Assessed the need for Foley I Assessed the need for Central Venous Line Family Discussion when available I Assessed the need for Mobilization I made an Assessment of medications to be adjusted accordingly Safety Risk assessment completed  CASE DISCUSSED IN MULTIDISCIPLINARY ROUNDS WITH ICU TEAM     Critical Care Time devoted to patient care services described in this note is 55 minutes.  Critical care was necessary to treat or prevent imminent or life-threatening deterioration.   PATIENT WITH VERY POOR PROGNOSIS I ANTICIPATE PROLONGED ICU LOS  Patient with Multiorgan failure and at high risk for cardiac arrest and death.    Kurian David Kasa, M.D.  [facility]  Medical Director ICU-[facility] [place] Medical Director [facility] Cardio-Pulmonary Department        

## 2021-01-11 NOTE — Progress Notes (Signed)
OT Cancellation Note  Patient Details Name: Carolyn Herring MRN: [id number] DOB: Mar 15, 1976   Cancelled Treatment:    Reason Eval/Treat Not Completed: Patient not medically ready. Chart reviewed - pt has transitioned to higher level of care on 01/10/21. Per chart pt intubated, will sign off at this time. Please reconsult when pt medically appropriate for therapy services. Thank you.   Kathie Dike, M.S. OTR/L  01/11/21, 8:24 AM  ascom [phone]

## 2021-01-11 NOTE — Progress Notes (Signed)
PT Cancellation Note  Patient Details Name: Carolyn Herring MRN: [id number] DOB: 1975-04-09   Cancelled Treatment:    Reason Eval/Treat Not Completed: Other (comment);Medical issues which prohibited therapy. Patient not medically ready. Chart reviewed - pt has transitioned to higher level of care on 01/10/21. Per chart pt intubated, will sign off at this time. Please reconsult when pt medically appropriate for therapy services. Thank you.    Olga Coaster PT, DPT 10:46 AM,01/11/21

## 2021-01-11 NOTE — Progress Notes (Signed)
GI Inpatient Follow-up Note  Subjective:  Patient seen in follow-up for GI bleed 2/2 large antral ulcer. She had EGD yesterday with Dr. Alice Reichert where EGD showed actively bleeding ulcer with visible vessel. Various modalities attempted to stop bleeding were unsuccessful to control the bleeding. Patient developed PEA arrest and CODE BLUE was initiated. She was transfused 4 units pRBCs, FFPs, and platelets. She was transferred to ICU and intubated and mechanically ventilated. She is requiring pressors. No acute events overnight. Hemoglobin stable at 14.4 this morning. No family at bedside.    Scheduled Inpatient Medications:   sodium chloride   Intravenous Once   budesonide (PULMICORT) nebulizer solution  0.5 mg Nebulization BID   chlorhexidine gluconate (MEDLINE KIT)  15 mL Mouth Rinse BID   Chlorhexidine Gluconate Cloth  6 each Topical Daily   docusate  100 mg Per Tube BID   fentaNYL (SUBLIMAZE) injection  50 mcg Intravenous Once   ipratropium-albuterol  3 mL Nebulization Q6H   mouth rinse  15 mL Mouth Rinse 10 times per day   polyethylene glycol  17 g Per Tube Daily    Continuous Inpatient Infusions:    sodium chloride 20 mL/hr at 01/10/21 1415   ampicillin-sulbactam (UNASYN) IV 3 g (01/11/21 1419)   anidulafungin 100 mg (01/11/21 1335)   fentaNYL infusion INTRAVENOUS 150 mcg/hr (01/11/21 1229)   norepinephrine (LEVOPHED) Adult infusion 4 mcg/min (01/11/21 1157)   pantoprazole Stopped (01/10/21 1052)   potassium chloride 10 mEq (01/11/21 1613)   propofol (DIPRIVAN) infusion 30 mcg/kg/min (01/11/21 1648)    PRN Inpatient Medications:  acetaminophen, fentaNYL, hydrALAZINE, loperamide, ondansetron (ZOFRAN) IV  Review of Systems: Constitutional: Weight is stable.  Eyes: No changes in vision. ENT: No oral lesions, sore throat.  GI: see HPI.  Heme/Lymph: No easy bruising.  CV: No chest pain.  GU: No hematuria.  Integumentary: No rashes.  Neuro: No headaches.  Psych: No  depression/anxiety.  Endocrine: No heat/cold intolerance.  Allergic/Immunologic: No urticaria.  Resp: No cough, SOB.  Musculoskeletal: No joint swelling.    Physical Examination: BP 113/74   Pulse 100   Temp 99.5 F (37.5 C)   Resp (!) 26   Ht 5' (1.524 m)   Wt 54.3 kg   SpO2 93%   BMI 23.38 kg/m   Constitutional: Intubated, sedated HENT: normocephalic without obvious abnormality  Respiratory: intubated, on ventilator Cardiovascular: tachycardic and sinus rhythm  Gastrointestinal: Soft, unable to reliably assess tenderness, non-distended  Data: Lab Results  Component Value Date   WBC 23.8 (H) 01/11/2021   HGB 14.4 01/11/2021   HCT 40.9 01/11/2021   MCV 79.1 (L) 01/11/2021   PLT 103 (L) 01/11/2021   Recent Labs  Lab 01/10/21 1705 01/10/21 2222 01/11/21 0512  HGB 14.3 14.5 14.4   Lab Results  Component Value Date   NA 139 01/11/2021   K 2.5 (LL) 01/11/2021   CL 102 01/11/2021   CO2 29 01/11/2021   BUN 12 01/11/2021   CREATININE 0.56 01/11/2021   Lab Results  Component Value Date   ALT 13 01/04/2021   AST 20 01/04/2021   ALKPHOS 82 01/04/2021   BILITOT 0.6 01/04/2021   Recent Labs  Lab 01/10/21 1307  APTT 57*  INR 1.9*   Assessment/Plan:  45 y/o Caucasian female with a PMH of HTN, hydrocephalus, and spina bifida s/p VP shunt with cervical tethered cord release in 2011 last revised in 2019 admitted to the hospital for recurrent falls, generalized weakness, and back pain found to have acute  blood loss anemia. EGD 10/11 by Dr. Haig Prophet showed large antral ulcer with adherent clot. She had recurrent melena with subsequent drop in hemoglobin over the weekend concerning for re-bleeding. EGD performed 10/16 by Dr. Alice Reichert confirmed active bleeding from large gastric ulcer c/b PEA arrest requiring massive transfusion protocol, intubation, mechanical ventilation, and transfer to ICU  Large bleeding gastric ulcer with visible vessel s/p EGD 10/16 - EGD yesterday  showed active bleeding with visible vessel  S/p PEA arrest 2/2 hemorrhagic shock from bleeding gastric ulcer  Acute respiratory failure with hypoxia 2/2 PEA arrest - intubated and sedated on mechanical ventilation   Candidemia   Recommendations:  - Hemoglobin stable after massive transfusion protocol with no active GIB - Appreciate General Surgery assistance. No plans for surgical intervention at this time. If re-bleeds, will need emergent surgery.  - Appreciate intensivist assistance - Continue IV acid suppression - Continue to monitor serial H&H. Transfuse for Hgb <7.0.  - GI will continue to follow  No family at bedside at time of my exam. Dr. Mortimer Fries has communicated plan of care to patient's brother.   Please call with questions or concerns.    Octavia Bruckner, PA-C [facility] [phone] (Cell)

## 2021-01-11 NOTE — Anesthesia Postprocedure Evaluation (Signed)
Anesthesia Post Note  Patient: Carolyn Herring  Procedure(s) Performed: ESOPHAGOGASTRODUODENOSCOPY (EGD)  Patient location during evaluation: ICU Anesthesia Type: General Level of consciousness: patient remains intubated per anesthesia plan Vital Signs Assessment: post-procedure vital signs reviewed and stable Respiratory status: patient on ventilator - see flowsheet for VS Cardiovascular status: stable Anesthetic complications: no   No notable events documented.   Last Vitals:  Vitals:   01/11/21 0400 01/11/21 0500  BP: 101/71 98/67  Pulse: 94 92  Resp: (!) 26 (!) 26  Temp: 37.5 C 37.7 C  SpO2: 98% 98%    Last Pain:  Vitals:   01/10/21 2000  TempSrc:   PainSc: 0-No pain                 Jaye Beagle

## 2021-01-11 NOTE — Progress Notes (Signed)
Pt remains on vent today.   This AM, SAT/SBT attempted. Pt became anxious/not following commands and not initiating own breaths on PS/sporadic breathing on PS. Pt placed back on PRVC within 10 min.   Routine MRI ordered by Dr. Belia Heman. MRI not able to see [place] d/t schedule.   However, over course of day, pt's neuro status improved. For 1600 assessment, pt nodding appropriately and moving all four extremities t command. Pt appears to be notably stronger in right upper AND lower extremity. Pt displays slight movement in left upper and lower extremities. Difficult to assess d/t lack of pt's baseline physical status. Pt tracking w/ eyes bilaterally. PRN Fent given via pump to keep pt raas -1 to -2.   Foley remains in place. Pt put out over 650 ml urine for this shift.   No BM this shift, no s/s of acute bleeding from rectum.   Bath complete.   Right upper arm laceration/abrasion assessed and cleansed. Site continues to drain serosanguinous drainage. Right upper arm cleansed, ABD pad placed and Kerlix wrapped around circumference of arm. Chux pad placed underneath arm.   This AM, multiple electrolytes low (K, Mag, and Ca). 8 runs of K infused, 6g Mag, and 2g Ca infused via central line. K finishing up @ end of shift. Will Greydon Betke recheck on all electrolytes @ 2000 this evening.   Na Bicarb gtt stopped this AM.   Brother and his wife @ bedside today.   Turn q2. Pt in no acute distress @ this time. Will continue to monitor.

## 2021-01-11 NOTE — Progress Notes (Signed)
Initial Nutrition Assessment  DOCUMENTATION CODES:   Not applicable  INTERVENTION:   If tube feeds initiated, recommend:   Vital 1.2 @ 64ml/hr- Initiate at 24ml/hr and increase by 7ml/hr q 8 hours until goal rate is reached.   Free water flushes 81ml q4 hours to maintain tube patency   Regimen provides 1440kcal/day, 90g/day protein and 1129ml/day free water.   Juven Fruit Punch BID via tube, each serving provides 95kcal and 2.5g of protein (amino acids glutamine and arginine)  NUTRITION DIAGNOSIS:   Inadequate oral intake related to inability to eat (pt sedated and ventilated) as evidenced by NPO status.  GOAL:   Provide needs based on ASPEN/SCCM guidelines  MONITOR:   Vent status, Labs, Weight trends, Skin, I & O's  REASON FOR ASSESSMENT:   Ventilator    ASSESSMENT:   45 yo female with h/o cervical spina bifida, HTN, anxiety, depression, hyrdocephalus s/p VP shunt with multiple neck surgeries related to shunt and recent COVID 19 (July 2022) who is admitted with GIB and possible PNA.  Pt sedated and ventilated. No OGT in place secondary to gastric ulcer. Pt eating 50-100% of meals in hospital prior to becoming NPO. Per chart, pt appears weight stable at baseline. 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   Pt s/p EGD 10/16; pt found to have large gastric ulcer and h/o billroth I gastroduodenostomy   Medications reviewed and include: colace, miralax, unasyn, KCl, propofol  Labs reviewed: K 2.5(L), Ca 6.3(L), P 4.2 wnl, Mg 1.3(L) Wbc- 23.8(H) Cbgs- 110, 114 x 24 hrs  Patient is currently intubated on ventilator support MV: 8.1 L/min Temp (24hrs), Avg:98.3 F (36.8 C), Min:95 F (35 C), Max:99.9 F (37.7 C)  Propofol: 6.5 ml/hr- provides 172kcal/day   MAP- >4mmHg  UOP-   NUTRITION - FOCUSED PHYSICAL EXAM:  Flowsheet Row Most Recent Value  Orbital Region No depletion  Upper Arm Region No  depletion  Thoracic and Lumbar Region No depletion  Buccal Region No depletion  Temple Region Mild depletion  Clavicle Bone Region Mild depletion  Clavicle and Acromion Bone Region Mild depletion  Scapular Bone Region No depletion  Dorsal Hand No depletion  Patellar Region Moderate depletion  Anterior Thigh Region Mild depletion  Posterior Calf Region Mild depletion  Edema (RD Assessment) None  Hair Reviewed  Eyes Reviewed  Mouth Reviewed  Skin Reviewed  Nails Reviewed   Diet Order:   Diet Order             Diet NPO time specified  Diet effective now                  EDUCATION NEEDS:   No education needs have been identified at this time  Skin:  Skin Assessment: Reviewed RN Assessment (ecchymosis, Stage II ankle)  Last BM:  10/16- type 5  Height:   Ht Readings from Last 1 Encounters:  01/08/21 5' (1.524 m)    Weight:   Wt Readings from Last 1 Encounters:  01/10/21 54.3 kg   BMI:  Body mass index is 23.38 kg/m.  Estimated Nutritional Needs:   Kcal:  1408kcal/day  Protein:  80-90g/day  Fluid:  1.4-1.6L/day  Betsey Holiday MS, RD, LDN Please refer to [facility] for RD and/or RD on-call/weekend/after hours pager

## 2021-01-11 NOTE — Progress Notes (Signed)
[facility] SURGICAL PROGRESS NOTE (cpt [phone])  Hospital Day(s): 7.   Post op day(s): 1 Day Post-Op.   Interval History:  Patient seen and examined She remains intubated and off vasopressor support. Leukocytosis improved to 23.8K. Hgb is improve and stable at 14.4 after massive transfusion. She does have numerous electrolyte derangements this morning; hypokalemia to 2.5, hypocalcemia to 6.5, hypomagnesemia to 1.3. she does have OG without further evidence of bleeding.   Review of Systems:  Unable to reliably preform secondary to intubation  Vital signs in last 24 hours: [min-max] current  Temp:  [95 F (35 C)-99.9 F (37.7 C)] 99.9 F (37.7 C) (10/17 0500) Pulse Rate:  [75-116] 92 (10/17 0500) Resp:  [18-30] 26 (10/17 0500) BP: (98-128)/(52-96) 98/67 (10/17 0500) SpO2:  [97 %-100 %] 98 % (10/17 0500) Arterial Line BP: (85-139)/(52-83) 139/79 (10/16 2100) FiO2 (%):  [50 %-100 %] 50 % (10/17 0312)     Height: 5' (152.4 cm) Weight: 54.3 kg BMI (Calculated): 23.38   Intake/Output last 2 shifts:  10/16 0701 - 10/17 0700 In: 7147.6 [I.V.:4486.4; Blood:2231.1; IV Piggyback:430.1] Out: 900 [Urine:900]   Physical Exam:  Constitutional: Intubated, sedated HENT: normocephalic without obvious abnormality  Respiratory: intubated, on ventilator Cardiovascular: tachycardic and sinus rhythm  Gastrointestinal: Soft, unable to reliably assess tenderness, non-distended   Labs:  CBC Latest Ref Rng & Units 01/11/2021 01/10/2021 01/10/2021  WBC 4.0 - 10.5 K/uL 23.8(H) - -  Hemoglobin 12.0 - 15.0 g/dL 85.4 62.7 03.5  Hematocrit 36.0 - 46.0 % 40.9 39.8 40.3  Platelets 150 - 400 K/uL 103(L) - -   CMP Latest Ref Rng & Units 01/11/2021 01/10/2021 01/10/2021  Glucose 70 - 99 mg/dL 009(F) 818(E) 96  BUN 6 - 20 mg/dL 12 19 99(B)  Creatinine 0.44 - 1.00 mg/dL 7.16 9.67 8.93  Sodium 135 - 145 mmol/L 139 138 136  Potassium 3.5 - 5.1 mmol/L 2.5(LL) 4.1 3.6  Chloride 98 - 111 mmol/L  102 109 108  CO2 22 - 32 mmol/L 29 16(L) 21(L)  Calcium 8.9 - 10.3 mg/dL 6.3(LL) 6.6(L) 7.6(L)  Total Protein 6.5 - 8.1 g/dL - - -  Total Bilirubin 0.3 - 1.2 mg/dL - - -  Alkaline Phos 38 - 126 U/L - - -  AST 15 - 41 U/L - - -  ALT 0 - 44 U/L - - -     Imaging studies: No new pertinent imaging studies   Assessment/Plan: (ICD-10's: R57.8) 45 y.o. female with, now stable, anemia secondary to massive GI hemorrhage from gastric ulcer, complicated by cardiac arrest   - No plans for surgical intervention at this time. Patient remains critically ill but thankfully has remained without signs of bleeding and Hgb remained stable s/p transfusion. If she has any signs of recurrence, may need to proceed emergently  - Monitor H&H closely; transfuse as needed  - Further management per primary service; we will follow closely    All of the above findings and recommendations were discussed with the medical team.  -- Lynden Oxford, PA-C [facility] 01/11/2021, 7:11 AM [phone] M-F: 7am - 4pm

## 2021-01-11 NOTE — Progress Notes (Signed)
   01/11/21 0645  Clinical Encounter Type  Visited With Patient  Visit Type Follow-up;Spiritual support  Referral From Chaplain  Consult/Referral To Chaplain  Spiritual Encounters  Spiritual Needs Prayer;Emotional  Chaplain Oleta Mouse did a FU visited before ending of shift. I checked in with Ms. Sparlin's nurse whom stated, "there is no change but the good thing is there is no more bleeding, and they are still suctioning  all the old blood out from when  the incident first happened. I provided a ministry of presence and prayer.

## 2021-01-11 NOTE — Progress Notes (Signed)
GOALS OF CARE DISCUSSION  The Clinical status was relayed to family in detail. Brother at bedside Updated and notified of patients medical condition.    Patient remains unresponsive and will not open eyes to command.   Patient is having a weak cough and struggling to remove secretions.   Patient with increased WOB and using accessory muscles to breathe Explained to family course of therapy and the modalities    Patient with Progressive multiorgan failure with a very high probablity of a very minimal chance of meaningful recovery despite all aggressive and optimal medical therapy.  PATIENT REMAINS FULL CODE  Family understands the situation.   Family are satisfied with Plan of action and management. All questions answered  Additional CC time 20 mins   Carolyn Herring Santiago Glad, M.D.  [facility]  Medical Director [facility] Medical Director [facility] Cardio-Pulmonary Department

## 2021-01-12 ENCOUNTER — Inpatient Hospital Stay: Payer: Medicaid Other

## 2021-01-12 DIAGNOSIS — R578 Other shock: Secondary | ICD-10-CM | POA: Diagnosis not present

## 2021-01-12 DIAGNOSIS — Z515 Encounter for palliative care: Secondary | ICD-10-CM

## 2021-01-12 DIAGNOSIS — I469 Cardiac arrest, cause unspecified: Secondary | ICD-10-CM

## 2021-01-12 DIAGNOSIS — Z789 Other specified health status: Secondary | ICD-10-CM

## 2021-01-12 DIAGNOSIS — B49 Unspecified mycosis: Secondary | ICD-10-CM | POA: Diagnosis not present

## 2021-01-12 DIAGNOSIS — D500 Iron deficiency anemia secondary to blood loss (chronic): Secondary | ICD-10-CM | POA: Diagnosis not present

## 2021-01-12 DIAGNOSIS — Z982 Presence of cerebrospinal fluid drainage device: Secondary | ICD-10-CM | POA: Diagnosis not present

## 2021-01-12 DIAGNOSIS — K254 Chronic or unspecified gastric ulcer with hemorrhage: Secondary | ICD-10-CM | POA: Diagnosis not present

## 2021-01-12 LAB — CBC
HCT: 41.4 % (ref 36.0–46.0)
Hemoglobin: 14.2 g/dL (ref 12.0–15.0)
MCH: 28.7 pg (ref 26.0–34.0)
MCHC: 34.3 g/dL (ref 30.0–36.0)
MCV: 83.6 fL (ref 80.0–100.0)
Platelets: 133 10*3/uL — ABNORMAL LOW (ref 150–400)
RBC: 4.95 MIL/uL (ref 3.87–5.11)
RDW: 18.6 % — ABNORMAL HIGH (ref 11.5–15.5)
WBC: 25.9 10*3/uL — ABNORMAL HIGH (ref 4.0–10.5)
nRBC: 0 % (ref 0.0–0.2)

## 2021-01-12 LAB — TYPE AND SCREEN
ABO/RH(D): O NEG
Antibody Screen: NEGATIVE
Unit division: 0
Unit division: 0
Unit division: 0
Unit division: 0
Unit division: 0
Unit division: 0
Unit division: 0
Unit division: 0
Unit division: 0
Unit division: 0
Unit division: 0
Unit division: 0
Unit division: 0
Unit division: 0

## 2021-01-12 LAB — PREPARE PLATELET PHERESIS
Unit division: 0
Unit division: 0

## 2021-01-12 LAB — PREPARE FRESH FROZEN PLASMA
Unit division: 0
Unit division: 0
Unit division: 0
Unit division: 0
Unit division: 0
Unit division: 0
Unit division: 0

## 2021-01-12 LAB — BPAM RBC
Blood Product Expiration Date: 202210152359
Blood Product Expiration Date: 202210272359
Blood Product Expiration Date: 202211032359
Blood Product Expiration Date: 202211042359
Blood Product Expiration Date: 202211042359
Blood Product Expiration Date: 202211082359
Blood Product Expiration Date: 202211132359
Blood Product Expiration Date: 202211132359
Blood Product Expiration Date: 202211152359
Blood Product Expiration Date: 202211162359
Blood Product Expiration Date: 202211162359
Blood Product Expiration Date: 202211162359
Blood Product Expiration Date: 202211162359
Blood Product Expiration Date: 202211172359
ISSUE DATE / TIME: 202210151125
ISSUE DATE / TIME: 202210151417
ISSUE DATE / TIME: 202210161152
ISSUE DATE / TIME: 202210161152
ISSUE DATE / TIME: 202210161223
ISSUE DATE / TIME: 202210161223
ISSUE DATE / TIME: 202210161403
ISSUE DATE / TIME: 202210161403
ISSUE DATE / TIME: 202210161403
ISSUE DATE / TIME: 202210161403
Unit Type and Rh: 5100
Unit Type and Rh: 5100
Unit Type and Rh: 5100
Unit Type and Rh: 5100
Unit Type and Rh: 9500
Unit Type and Rh: 9500
Unit Type and Rh: 9500
Unit Type and Rh: 9500
Unit Type and Rh: 9500
Unit Type and Rh: 9500
Unit Type and Rh: 9500
Unit Type and Rh: 9500
Unit Type and Rh: 9500
Unit Type and Rh: 9500

## 2021-01-12 LAB — BPAM FFP
Blood Product Expiration Date: 202210212359
Blood Product Expiration Date: 202210212359
Blood Product Expiration Date: 202210212359
Blood Product Expiration Date: 202210212359
Blood Product Expiration Date: 202210212359
Blood Product Expiration Date: 202210212359
Blood Product Expiration Date: 202210212359
Blood Product Expiration Date: 202210212359
ISSUE DATE / TIME: 202210161403
ISSUE DATE / TIME: 202210161403
ISSUE DATE / TIME: 202210161403
ISSUE DATE / TIME: 202210170941
ISSUE DATE / TIME: 202210171058
Unit Type and Rh: 5100
Unit Type and Rh: 5100
Unit Type and Rh: 600
Unit Type and Rh: 600
Unit Type and Rh: 6200
Unit Type and Rh: 6200
Unit Type and Rh: 7300
Unit Type and Rh: 8400

## 2021-01-12 LAB — ANTIFUNGAL AST 9 DRUG PANEL
Amphotericin B MIC: 2
Fluconazole Islt MIC: 64
Flucytosine MIC: 8
Itraconazole MIC: 0.5
Posaconazole MIC: 0.5
Source: 183119
Voriconazole MIC: 0.5

## 2021-01-12 LAB — BASIC METABOLIC PANEL
Anion gap: 7 (ref 5–15)
BUN: 13 mg/dL (ref 6–20)
CO2: 27 mmol/L (ref 22–32)
Calcium: 7.3 mg/dL — ABNORMAL LOW (ref 8.9–10.3)
Chloride: 101 mmol/L (ref 98–111)
Creatinine, Ser: 0.67 mg/dL (ref 0.44–1.00)
GFR, Estimated: >60 mL/min (ref >60)
Glucose, Bld: 106 mg/dL — ABNORMAL HIGH (ref 70–99)
Potassium: 4.2 mmol/L (ref 3.5–5.1)
Sodium: 135 mmol/L (ref 135–145)

## 2021-01-12 LAB — BPAM PLATELET PHERESIS
Blood Product Expiration Date: 202210192359
Blood Product Expiration Date: 202210192359
ISSUE DATE / TIME: 202210161408
ISSUE DATE / TIME: 202210171504
Unit Type and Rh: 5100
Unit Type and Rh: 6200

## 2021-01-12 LAB — MASSIVE TRANSFUSION PROTOCOL ORDER (BLOOD BANK NOTIFICATION)

## 2021-01-12 LAB — ALBUMIN: Albumin: 1.9 g/dL — ABNORMAL LOW (ref 3.5–5.0)

## 2021-01-12 LAB — MAGNESIUM: Magnesium: 2.4 mg/dL (ref 1.7–2.4)

## 2021-01-12 LAB — PHOSPHORUS: Phosphorus: 3.4 mg/dL (ref 2.5–4.6)

## 2021-01-12 MED ORDER — PANTOPRAZOLE SODIUM 40 MG IV SOLR
40.0000 mg | Freq: Two times a day (BID) | INTRAVENOUS | Status: DC
  Administered 2021-01-12 – 2021-01-14 (×4): 40 mg via INTRAVENOUS
  Filled 2021-01-12 (×5): qty 40

## 2021-01-12 MED ORDER — FENTANYL CITRATE PF 50 MCG/ML IJ SOSY
50.0000 ug | PREFILLED_SYRINGE | INTRAMUSCULAR | Status: DC | PRN
  Administered 2021-01-12 – 2021-01-14 (×6): 50 ug via INTRAVENOUS
  Filled 2021-01-12 (×6): qty 1

## 2021-01-12 MED ORDER — OXYCODONE HCL 5 MG PO TABS
5.0000 mg | ORAL_TABLET | ORAL | Status: DC | PRN
  Administered 2021-01-14 – 2021-01-18 (×19): 5 mg via ORAL
  Filled 2021-01-12 (×19): qty 1

## 2021-01-12 NOTE — TOC Progression Note (Addendum)
Transition of Care [facility]) - Progression Note    Patient Details  Name: Carolyn Herring MRN: [id number] Date of Birth: 1975-07-20  Transition of Care [facility]) CM/SW Contact  Marina Goodell Phone Number: [phone] 01/12/2021, 9:15 AM  Clinical Narrative:     Patient is more stable, remains intubated, OG in place, tracking with eyes, remain critically ill but stable.  CSW spoke with patient's brother Lance Coon [phone], and discussed discharge plan.  Patient will need new PT/OT eval once she is ready, to assist with determining d/c placement. TOC will continue to follow.   Expected Discharge Plan: Skilled Nursing Facility Barriers to Discharge: Continued Medical Work up  Expected Discharge Plan and Services Expected Discharge Plan: Skilled Nursing Facility In-house Referral: Clinical Social Work   Post Acute Care Choice: Skilled Nursing Facility Living arrangements for the past 2 months: Single Family Home                                       Social Determinants of Health (SDOH) Interventions    Readmission Risk Interventions No flowsheet data found.

## 2021-01-12 NOTE — Procedures (Signed)
Extubation Procedure Note  Patient Details:   Name: KASSAUNDRA HAIR DOB: [birth year] MRN: [id number]   Airway Documentation:    Vent end date: 01/12/21 Vent end time: 1013   Evaluation  O2 sats: stable throughout Complications: No apparent complications Patient did tolerate procedure well. Breath Sounds: Diminished   Yes.  Small cuff leak present prior to extubation.  Extubated to HFNC.  Able to cough and speak.  Ronda Fairly Jaqualin Serpa 01/12/2021, 12:07 PM

## 2021-01-12 NOTE — Progress Notes (Signed)
Pt extubated this AM. Pt placed on HHFNC 45L 55%. To auscultation, pt continues to have course crackles and intermittent expiratory wheezes. Weak productive cough present.    Pt taken to MRI, see results for impression.   Continued issues w/ speech production, when pt does speak, it is clear and easy to understand. Pt seems to display some echolalia, stating own name only when told what it is. However, pt will spontaneously say words on own (Pt stated daughter's name). At times, pt is fixated on 1 word and repeats it.   Pt follows all commands, moves all 4 extremities. Pt remains very weak.  Right upper arm laceration continues to drain serous/serosanguinous output. Dressing changed w/ ABD pad and Kerlix.   Pt had medium BM this evening.   Foley remains in place, put out 900 ml clear yellow/amber urine.   Brother @ bedside, updated by Dr. Belia Heman regarding MRI results.    Turn q2. Pt in no acute distress @ this time. Will continue to monitor.

## 2021-01-12 NOTE — Progress Notes (Signed)
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  She has not seen a neurosurgeon in 3 yrs.  She presented to [facility] ER on 10/10 via EMS from home with recurrent falls, generalized weakness, and back pain.  She is unsure if she has been hitting her head during the falls.  She also endorsed several week hx of abdominal pain and diarrhea.  She was diagnosed with COVID-19 in 08/2020, and has had worsening generalized weakness since the dx.  She has not seen a PCP for evaluation of symptoms.  She does live alone, but her aunt lives next door.  On 10/10 pt able to notify EMS for assistance, however when they arrived at her home she was unable to answer the door.  Therefore,  EMS had to break her door down to gain entry.  Hospital course Was admitted and subsequently resuscitated with blood transfusions and IV fluids and was able to transfer to the regular medical floor.  Her encephalopathy cleared.  +FUNGEMIA She underwent upper endoscopy on 05 January 2021 that revealed a very large antral ulcer with adherent clot.  Clot was not removed.  The ulcer description was that of a giant ulcer.  It was believed the etiology was from the patient grinding NSAIDs to ingest them for control of pain.  She apparently stabilized after this initial EGD procedure and no further drop in H&H was noted.  She then underwent a TEE with anesthesia monitoring and did not have difficulties through that.  However yesterday it appeared that she had melenic stools and her H&H did have a significant drop.  GI was then reconsulted and EGD was planned for today.  The patient again was noted to have a large gastric ulcer it was noted to be bleeding actively and various methodologies were utilized to attempt control however the patient  developed PEA arrest and respiratory distress after aspiration of blood.  The patient underwent massive transfusion protocol.  CODE BLUE situation ensued the patient was resuscitated.  She was transferred to the intensive care unit intubated and mechanically ventilated.  Patient received 4 units of blood during resuscitation.  She has also required fresh frozen plasma and platelets transfusion.  Currently requiring pressors.  She is profoundly acidotic.  Intubated and unresponsive on the vent.  PCCM consulted to assume care while patient is in ICU.   Significant Hospital Events: Including procedures, antibiotic start and stop dates in addition to other pertinent events   10/10: Pt admitted to ICU with acute encephalopathy concerning for acute CVA, hemorrhagic shock, possible sepsis, and acute hypoxic respiratory failure secondary to possible aspiration pneumonia  10/16: readmit to ICU with hemorrhagic shock S/P PEA arrest during endoscopy 10/17 remains on vent      Micro Data:  Respiratory Panel by RT-PCR 10/10>>negative  Blood 10/10>>negative  CSF 10/13>>  Antimicrobials:  Zosyn 10/10 x1 dose Unasyn 10/10>>     Interim History / Subjective:  Off vent Remains critically ill Plan for SAT/SBT      Objective   Blood pressure 111/67, pulse (!) 105, temperature (!) 100.6 F (38.1 C), resp. rate (!) 26, height 5' (1.524 m), weight 54.3 kg, SpO2 90 %.    Vent Mode: PRVC FiO2 (%):  [28 %-40 %] 35 % Set Rate:  [26 bmp]  26 bmp Vt Set:  [300 mL] 300 mL PEEP:  [5 cmH20] 5 cmH20 Plateau Pressure:  [20 cmH20-23 cmH20] 20 cmH20   Intake/Output Summary (Last 24 hours) at 01/12/2021 9629 Last data filed at 01/12/2021 0400 Gross per 24 hour  Intake 1913.82 ml  Output 1450 ml  Net 463.82 ml    Filed Weights   01/08/21 0703 01/09/21 0500 01/10/21 0500  Weight: 50.9 kg 54.2 kg 54.3 kg    REVIEW OF SYSTEMS  PATIENT IS UNABLE TO PROVIDE COMPLETE REVIEW OF SYSTEMS DUE TO SEVERE  CRITICAL ILLNESS AND TOXIC METABOLIC ENCEPHALOPATHY   PHYSICAL EXAMINATION:  GENERAL:critically ill appearing, +resp distress EYES: Pupils equal, round, reactive to light.  No scleral icterus.  MOUTH: Moist mucosal membrane. INTUBATED NECK: Supple.  PULMONARY: +rhonchi, +wheezing CARDIOVASCULAR: S1 and S2.  No murmurs  GASTROINTESTINAL: Soft, nontender, -distended. Positive bowel sounds.  MUSCULOSKELETAL: No swelling, clubbing, or edema.  NEUROLOGIC: obtunded SKIN:intact,warm,dry      Labs/imaging that I havepersonally reviewed  (right click and "Reselect all SmartList Selections" daily)        ASSESSMENT AND PLAN SYNOPSIS  45 yo female initially admitted for fungemia with infected shunt found to have bleeding ulcer now with severe resp failure from acute cardiac arrest and hemorrhagic shock due to massive GIB    Severe ACUTE Hypoxic and Hypercapnic Respiratory Failure -continue Mechanical Ventilator support -continue Bronchodilator Therapy -Wean Fio2 and PEEP as tolerated -VAP/VENT bundle implementation -will perform SAT/SBT when respiratory parameters are met  Vent Mode: PRVC FiO2 (%):  [28 %-40 %] 35 % Set Rate:  [26 bmp] 26 bmp Vt Set:  [300 mL] 300 mL PEEP:  [5 cmH20] 5 cmH20 Plateau Pressure:  [20 cmH20-23 cmH20] 20 cmH20   ACUTE CARDIAC FAILURE-  S/P PEA arrest D/T 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 Vent support  SEVERE COPD EXACERBATION -continue IV steroids as prescribed -continue NEB THERAPY as prescribed  CARDIAC ICU monitoring    NEUROLOGY ACUTE TOXIC METABOLIC ENCEPHALOPATHY -need for sedation -Goal RASS -2 to -3 SAT/SBT pending   HYPOVOLUMIC SHOCK SOURCE-GIB -use vasopressors to keep MAP>65 as needed -follow ABG and LA  INFECTIOUS DISEASE -continue antibiotics as prescribed -follow up cultures -follow up ID consultation  ENDO - ICU hypoglycemic\Hyperglycemia protocol -check FSBS per protocol  GI GI  PROPHYLAXIS as indicated  NUTRITIONAL STATUS DIET-->on hold for now Constipation protocol as indicated   ELECTROLYTES -follow labs as needed -replace as needed -pharmacy consultation and following    Best practice (right click and "Reselect all SmartList Selections" daily)  Diet:  NPO Pain/Anxiety/Delirium protocol (if indicated): Yes (RASS goal -2) VAP protocol (if indicated): Yes DVT prophylaxis: Contraindicated GI prophylaxis: PPI Glucose control:  Basal insulin No Central venous access:  Yes, and it is still needed Arterial line:  Yes, and it is still needed Foley:  Yes, and it is still needed Mobility:  bed rest  Code Status:  FULL CODE Disposition: ICU  Labs   CBC: Recent Labs  Lab 01/09/21 0438 01/09/21 0802 01/10/21 0506 01/10/21 1307 01/10/21 1337 01/10/21 1705 01/10/21 2222 01/11/21 0512 01/11/21 1956 01/12/21 0530  WBC 22.0*  --  33.7*  --  38.5*  --   --  23.8*  --  25.9*  NEUTROABS  --   --   --   --  26.4*  --   --   --   --   --   HGB 5.9*   < > 9.8* 10.3* 10.5* 14.3 14.5 14.4 14.6  14.2  HCT 19.0*   < > 29.4* 31.3* 31.0* 40.3 39.8 40.9 41.9 41.4  MCV 85.6  --  85.5  --  91.4  --   --  79.1*  --  83.6  PLT 244  --  150 84* 84*  --   --  103*  --  133*   < > = values in this interval not displayed.     Basic Metabolic Panel: Recent Labs  Lab 01/07/21 0455 01/08/21 0514 01/09/21 0438 01/10/21 0506 01/10/21 1315 01/11/21 0512 01/11/21 1956 01/12/21 0530  NA 136   < > 136 136 138 139  --  135  K 4.2   < > 3.6 3.6 4.1 2.5* 4.3 4.2  CL 104   < > 105 108 109 102  --  101  CO2 24   < > 24 21* 16* 29  --  27  GLUCOSE 110*   < > 95 96 341* 121*  --  106*  BUN 15   < > 21* 22* 19 12  --  13  CREATININE 0.62   < > 0.66 0.52 0.89 0.56  --  0.67  CALCIUM 8.1*   < > 7.8* 7.6* 6.6* 6.3* 6.9* 7.3*  MG 2.0   < > 1.9 1.7  --  1.3* 2.9* 2.4  PHOS 2.9  --   --   --   --  4.2  --  3.4   < > = values in this interval not displayed.    GFR: Estimated  Creatinine Clearance: 64.5 mL/min (by C-G formula based on SCr of 0.67 mg/dL). Recent Labs  Lab 01/06/21 0415 01/07/21 0455 01/10/21 0506 01/10/21 1337 01/11/21 0512 01/12/21 0530  WBC 18.7*   < > 33.7* 38.5* 23.8* 25.9*  LATICACIDVEN 1.2  --   --   --   --   --    < > = values in this interval not displayed.     Liver Function Tests: Recent Labs  Lab 01/12/21 0530  ALBUMIN 1.9*    No results for input(s): LIPASE, AMYLASE in the last 168 hours.  No results for input(s): AMMONIA in the last 168 hours.  ABG    Component Value Date/Time   PHART 7.46 (H) 01/11/2021 0512   PCO2ART 46 01/11/2021 0512   PO2ART 90 01/11/2021 0512   HCO3 32.7 (H) 01/11/2021 0512   ACIDBASEDEF 7.3 (H) 01/10/2021 1532   O2SAT 97.4 01/11/2021 0512      Coagulation Profile: Recent Labs  Lab 01/10/21 1307  INR 1.9*     Cardiac Enzymes: No results for input(s): CKTOTAL, CKMB, CKMBINDEX, TROPONINI in the last 168 hours.   HbA1C: Hgb A1c MFr Bld  Date/Time Value Ref Range Status  01/05/2021 05:40 AM 5.4 4.8 - 5.6 % Final    Comment:    (NOTE)         Prediabetes: 5.7 - 6.4         Diabetes: >6.4         Glycemic control for adults with diabetes: <7.0     CBG: Recent Labs  Lab 01/10/21 0348 01/10/21 1559 01/11/21 0739 01/11/21 1120 01/11/21 1945  GLUCAP 95 293* 110* 114* 107*     Allergies Allergies  Allergen Reactions   Tape Dermatitis    **BLISTERS** **BLISTERS**    Codeine Itching   Hydrogen Peroxide Other (See Comments)    Skin Irritation   Latex Other (See Comments)    Skin Irritation; blisters  DVT/GI PRX  assessed I Assessed the need for Labs I Assessed the need for Foley I Assessed the need for Central Venous Line Family Discussion when available I Assessed the need for Mobilization I made an Assessment of medications to be adjusted accordingly Safety Risk assessment completed  CASE DISCUSSED IN MULTIDISCIPLINARY ROUNDS WITH ICU  TEAM     Critical Care Time devoted to patient care services described in this note is 45 minutes.  Critical care was necessary to treat /prevent imminent and life-threatening deterioration. Overall, patient is critically ill, prognosis is guarded.    Corrin Parker, M.D.  [facility]  Medical Director [place] Director [facility] Cardio-Pulmonary Department

## 2021-01-12 NOTE — Progress Notes (Signed)
GI Inpatient Follow-up Note  Subjective:  Patient seen and has been extubated. Hemoglobin stable.  Scheduled Inpatient Medications:   budesonide (PULMICORT) nebulizer solution  0.5 mg Nebulization BID   chlorhexidine gluconate (MEDLINE KIT)  15 mL Mouth Rinse BID   Chlorhexidine Gluconate Cloth  6 each Topical Daily   docusate  100 mg Per Tube BID   ipratropium-albuterol  3 mL Nebulization Q6H   mouth rinse  15 mL Mouth Rinse 10 times per day   pantoprazole (PROTONIX) IV  40 mg Intravenous BID   polyethylene glycol  17 g Per Tube Daily    Continuous Inpatient Infusions:    sodium chloride 20 mL/hr at 01/10/21 1415   ampicillin-sulbactam (UNASYN) IV 3 g (01/12/21 1432)   anidulafungin 100 mg (01/12/21 1433)   fentaNYL infusion INTRAVENOUS Stopped (01/12/21 0840)   norepinephrine (LEVOPHED) Adult infusion Stopped (01/12/21 0842)   propofol (DIPRIVAN) infusion Stopped (01/12/21 0840)    PRN Inpatient Medications:  acetaminophen, fentaNYL, hydrALAZINE, loperamide, ondansetron (ZOFRAN) IV  Review of Systems: Unable to adequately assess due to mental status   Physical Examination: BP 116/69   Pulse 98   Temp (S) 99.3 F (37.4 C) (Axillary)   Resp 16   Ht 5' (1.524 m)   Wt 54.3 kg   SpO2 95%   BMI 23.38 kg/m  Gen: NAD HEENT: PEERLA, EOMI, Neck: supple, no JVD or thyromegaly Chest: No respiratory distress on hi flow CV: RRR, no m/g/c/r Abd: soft, NT, ND Ext: no edema, well perfused with 2+ pulses, Skin: no rash or lesions noted Lymph: no LAD  Data: Lab Results  Component Value Date   WBC 25.9 (H) 01/12/2021   HGB 14.2 01/12/2021   HCT 41.4 01/12/2021   MCV 83.6 01/12/2021   PLT 133 (L) 01/12/2021   Recent Labs  Lab 01/11/21 0512 01/11/21 1956 01/12/21 0530  HGB 14.4 14.6 14.2   Lab Results  Component Value Date   NA 135 01/12/2021   K 4.2 01/12/2021   CL 101 01/12/2021   CO2 27 01/12/2021   BUN 13 01/12/2021   CREATININE 0.67 01/12/2021   Lab  Results  Component Value Date   ALT 13 01/04/2021   AST 20 01/04/2021   ALKPHOS 82 01/04/2021   BILITOT 0.6 01/04/2021   Recent Labs  Lab 01/10/21 1307  APTT 57*  INR 1.9*   Assessment/Plan: Carolyn Herring is a 45 y.o. lady with history of hypertension and spina bifida s/p VP shunt with candida blood stream infection from large gastric ulcer s/p PEA arrest after EGD over weekend. Was extubated today. Hemoglobin stable.  Recommendations:  - IV PPI BID while in hospital - monitor hemoglobins - monitor for overt GI bleeding - appreciate surgical assistance - supportive care per primary team  Will continue to follow. Please call with any questions or concerns.  Carolyn Miyamoto MD, MPH [place]

## 2021-01-12 NOTE — Progress Notes (Signed)
Nutrition Follow Up Note   DOCUMENTATION CODES:   Not applicable  INTERVENTION:   RD will add supplements once pt's diet is advanced.   Pt at high refeed risk; recommend monitor potassium, magnesium and phosphorus labs daily until stable  NUTRITION DIAGNOSIS:   Inadequate oral intake related to inability to eat (pt sedated and ventilated) as evidenced by NPO status.  GOAL:   Patient will meet greater than or equal to 90% of their needs -not met   MONITOR:   Diet advancement, Labs, Weight trends, Skin, I & O's  ASSESSMENT:   45 yo female with h/o cervical spina bifida, HTN, anxiety, depression, hyrdocephalus s/p VP shunt with multiple neck surgeries related to shunt and recent COVID 19 (July 2022) who is admitted with GIB and possible PNA.  Pt s/p EGD 10/16; pt found to have large gastric ulcer and h/o billroth I gastroduodenostomy   Pt extubated today. RD will add supplements once pt's diet is advanced. Pt remains at high refeed risk. Per chart, pt appears weight stable since admit.   Medications reviewed and include: colace, miralax, unasyn, eraxis  Labs reviewed: K 4.2 wnl, Ca 6.3(L), P 3.4 wnl, Mg 2.4 wnl Wbc- 25.9(H) Cbgs- 110, 114, 107 x 24 hrs  Diet Order:   Diet Order             Diet NPO time specified  Diet effective now                  EDUCATION NEEDS:   No education needs have been identified at this time  Skin:  Skin Assessment: Reviewed RN Assessment (ecchymosis, Stage II ankle)  Last BM:  10/16- type 5  Height:   Ht Readings from Last 1 Encounters:  01/08/21 5' (1.524 m)    Weight:   Wt Readings from Last 1 Encounters:  01/10/21 54.3 kg   BMI:  Body mass index is 23.38 kg/m.  Estimated Nutritional Needs:   Kcal:  1500-1700kcal/day  Protein:  75-85g/day  Fluid:  1.4-1.6L/day  Koleen Distance MS, RD, LDN Please refer to [facility] for RD and/or RD on-call/weekend/after hours pager

## 2021-01-12 NOTE — Progress Notes (Signed)
[facility] PROGRESS NOTE Date of Admission:  01/04/2021     ID: Carolyn Herring is a 45 y.o. female with  Candidemia Active Problems:   Hemorrhagic shock ([place])   Fall   Altered mental status   History of brain shunt   Pressure injury of skin   Complication of ventricular intracranial shunt   Endotracheal tube present   Acute respiratory failure (HCC)   On mechanically assisted ventilation (HCC)   Cardiac arrest with pulseless electrical activity (HCC)   Candidemia (HCC)   Hypokalemia   Metabolic acidosis   Subjective: Extubated and alert now. Had fever 101, wbc 26. MRI with multipel small infarcts  ROS unable to obtain Medications:  Antibiotics Given (last 72 hours)     Date/Time Action Medication Dose Rate   01/10/21 1816 New Bag/Given   Ampicillin-Sulbactam (UNASYN) 3 g in sodium chloride 0.9 % 100 mL IVPB 3 g 200 mL/hr   01/11/21 0010 New Bag/Given   Ampicillin-Sulbactam (UNASYN) 3 g in sodium chloride 0.9 % 100 mL IVPB 3 g 200 mL/hr   01/11/21 0920 New Bag/Given   Ampicillin-Sulbactam (UNASYN) 3 g in sodium chloride 0.9 % 100 mL IVPB 3 g 200 mL/hr   01/11/21 1419 New Bag/Given   Ampicillin-Sulbactam (UNASYN) 3 g in sodium chloride 0.9 % 100 mL IVPB 3 g 200 mL/hr   01/11/21 1959 New Bag/Given   Ampicillin-Sulbactam (UNASYN) 3 g in sodium chloride 0.9 % 100 mL IVPB 3 g 200 mL/hr   01/12/21 0156 New Bag/Given   Ampicillin-Sulbactam (UNASYN) 3 g in sodium chloride 0.9 % 100 mL IVPB 3 g 200 mL/hr   01/12/21 0930 New Bag/Given   Ampicillin-Sulbactam (UNASYN) 3 g in sodium chloride 0.9 % 100 mL IVPB 3 g 200 mL/hr   01/12/21 1432 New Bag/Given   Ampicillin-Sulbactam (UNASYN) 3 g in sodium chloride 0.9 % 100 mL IVPB 3 g 200 mL/hr       budesonide (PULMICORT) nebulizer solution  0.5 mg Nebulization BID   chlorhexidine gluconate (MEDLINE KIT)  15 mL Mouth Rinse BID   Chlorhexidine Gluconate Cloth  6 each Topical Daily   docusate  100 mg Per Tube  BID   ipratropium-albuterol  3 mL Nebulization Q6H   mouth rinse  15 mL Mouth Rinse 10 times per day   polyethylene glycol  17 g Per Tube Daily    Objective: Vital signs in last 24 hours: Temp:  [99.1 F (37.3 C)-101.5 F (38.6 C)] 99.3 F (37.4 C) (10/18 0945) Pulse Rate:  [81-132] 132 (10/18 1500) Resp:  [9-29] 16 (10/18 1500) BP: (104-154)/(61-104) 124/70 (10/18 1500) SpO2:  [87 %-100 %] 100 % (10/18 1500) Arterial Line BP: (90-235)/(50-232) 113/88 (10/18 1500) FiO2 (%):  [28 %-55 %] 50 % (10/18 1327) Constitutional:  extubated.  Mouth/Throat:op clear  Cardiovascular: Normal rate, regular rhythm and normal heart sounds.  Pulmonary/Chest: mech breath sounds Neck = supple, no nuchal rigidity Abdominal: Soft. Bowel sounds are normal.  Neurological: intubated sedated extremeties. Some contractures on hands Skin: Skin is warm and dry.  Lab Results Recent Labs    01/11/21 0512 01/11/21 1956 01/12/21 0530  WBC 23.8*  --  25.9*  HGB 14.4 14.6 14.2  HCT 40.9 41.9 41.4  NA 139  --  135  K 2.5* 4.3 4.2  CL 102  --  101  CO2 29  --  27  BUN 12  --  13  CREATININE 0.56  --  0.67    Microbiology: Results  for orders placed or performed during the hospital encounter of 01/04/21  Resp Panel by RT-PCR (Flu A&B, Covid) Nasopharyngeal Swab     Status: None   Collection Time: 01/04/21 12:05 PM   Specimen: Nasopharyngeal Swab; Nasopharyngeal(NP) swabs in vial transport medium  Result Value Ref Range Status   SARS Coronavirus 2 by RT PCR NEGATIVE NEGATIVE Final    Comment: (NOTE) SARS-CoV-2 target nucleic acids are NOT DETECTED.  The SARS-CoV-2 RNA is generally detectable in upper respiratory specimens during the acute phase of infection. The lowest concentration of SARS-CoV-2 viral copies this assay can detect is 138 copies/mL. A negative result does not preclude SARS-Cov-2 infection and should not be used as the sole basis for treatment or other patient management decisions.  A negative result may occur with  improper specimen collection/handling, submission of specimen other than nasopharyngeal swab, presence of viral mutation(s) within the areas targeted by this assay, and inadequate number of viral copies(<138 copies/mL). A negative result must be combined with clinical observations, patient history, and epidemiological information. The expected result is Negative.  Fact Sheet for Patients:  EntrepreneurPulse.com.au  Fact Sheet for Healthcare Providers:  IncredibleEmployment.be  This test is no t yet approved or cleared by the [place] FDA and  has been authorized for detection and/or diagnosis of SARS-CoV-2 by FDA under an Emergency Use Authorization (EUA). This EUA will remain  in effect (meaning this test can be used) for the duration of the COVID-19 declaration under Section 564(b)(1) of the Act, 21 U.S.C.section 360bbb-3(b)(1), unless the authorization is terminated  or revoked sooner.       Influenza A by PCR NEGATIVE NEGATIVE Final   Influenza B by PCR NEGATIVE NEGATIVE Final    Comment: (NOTE) The Xpert Xpress SARS-CoV-2/FLU/RSV plus assay is intended as an aid in the diagnosis of influenza from Nasopharyngeal swab specimens and should not be used as a sole basis for treatment. Nasal washings and aspirates are unacceptable for Xpert Xpress SARS-CoV-2/FLU/RSV testing.  Fact Sheet for Patients: EntrepreneurPulse.com.au  Fact Sheet for Healthcare Providers: IncredibleEmployment.be  This test is not yet approved or cleared by the [place] FDA and has been authorized for detection and/or diagnosis of SARS-CoV-2 by FDA under an Emergency Use Authorization (EUA). This EUA will remain in effect (meaning this test can be used) for the duration of the COVID-19 declaration under Section 564(b)(1) of the Act, 21 U.S.C. section 360bbb-3(b)(1), unless the authorization  is terminated or revoked.  Performed at [facility], [address] [place], [place] [address]   Blood culture (routine single)     Status: Abnormal (Preliminary result)   Collection Time: 01/04/21  2:58 PM   Specimen: BLOOD  Result Value Ref Range Status   Specimen Description   Final    BLOOD LEFT ANTECUBITAL Performed at [facility], [address], [place], [place] [address]    Special Requests   Final    BOTTLES DRAWN AEROBIC AND ANAEROBIC Blood Culture adequate volume Performed at [facility], [address] [place], [place] [address]    Culture  Setup Time   Final    YEAST ANAEROBIC BOTTLE ONLY Organism ID to follow CRITICAL RESULT CALLED TO, READ BACK BY AND VERIFIED WITH: SAMANTHA RAUER 01/05/21 1507 SLM IN BOTH AEROBIC AND ANAEROBIC BOTTLES Performed at [facility], [address] [place], [place] [address]    Culture (A)  Final    CANDIDA KRUSEI Sent to [place] for further susceptibility testing. Performed at [facility], [place] [address] [place], [place] [address]    Report Status PENDING  Incomplete  Blood Culture ID Panel (Reflexed)     Status: Abnormal   Collection Time: 01/04/21  2:58 PM  Result Value Ref Range Status   Enterococcus faecalis NOT DETECTED NOT DETECTED Final   Enterococcus Faecium NOT DETECTED NOT DETECTED Final   Listeria monocytogenes NOT DETECTED NOT DETECTED Final   Staphylococcus species NOT DETECTED NOT DETECTED Final   Staphylococcus aureus (BCID) NOT DETECTED NOT DETECTED Final   Staphylococcus epidermidis NOT DETECTED NOT DETECTED Final   Staphylococcus lugdunensis NOT DETECTED NOT DETECTED Final   Streptococcus species NOT DETECTED NOT DETECTED Final   Streptococcus agalactiae NOT DETECTED NOT DETECTED Final   Streptococcus pneumoniae NOT DETECTED NOT DETECTED Final   Streptococcus pyogenes NOT DETECTED NOT DETECTED Final   A.calcoaceticus-baumannii NOT DETECTED NOT DETECTED  Final   Bacteroides fragilis NOT DETECTED NOT DETECTED Final   Enterobacterales NOT DETECTED NOT DETECTED Final   Enterobacter cloacae complex NOT DETECTED NOT DETECTED Final   Escherichia coli NOT DETECTED NOT DETECTED Final   Klebsiella aerogenes NOT DETECTED NOT DETECTED Final   Klebsiella oxytoca NOT DETECTED NOT DETECTED Final   Klebsiella pneumoniae NOT DETECTED NOT DETECTED Final   Proteus species NOT DETECTED NOT DETECTED Final   Salmonella species NOT DETECTED NOT DETECTED Final   Serratia marcescens NOT DETECTED NOT DETECTED Final   Haemophilus influenzae NOT DETECTED NOT DETECTED Final   Neisseria meningitidis NOT DETECTED NOT DETECTED Final   Pseudomonas aeruginosa NOT DETECTED NOT DETECTED Final   Stenotrophomonas maltophilia NOT DETECTED NOT DETECTED Final   Candida albicans NOT DETECTED NOT DETECTED Final   Candida auris NOT DETECTED NOT DETECTED Final   Candida glabrata NOT DETECTED NOT DETECTED Final   Candida krusei DETECTED (A) NOT DETECTED Final    Comment: CRITICAL RESULT CALLED TO, READ BACK BY AND VERIFIED WITH: SAMANTHA RAUER 01/05/21 1507 SLM    Candida parapsilosis NOT DETECTED NOT DETECTED Final   Candida tropicalis NOT DETECTED NOT DETECTED Final   Cryptococcus neoformans/gattii NOT DETECTED NOT DETECTED Final    Comment: Performed at [facility], [place], [place] [address]  Antifungal AST 9 Drug Panel     Status: None   Collection Time: 01/04/21  2:58 PM  Result Value Ref Range Status   Organism ID, Yeast Candida krusei  Corrected    Comment: (NOTE) Identification performed by account, not confirmed by this laboratory. CORRECTED ON 10/18 AT 0537: PREVIOUSLY REPORTED AS Preliminary report    Amphotericin B MIC 2.0 ug/mL  Final    Comment: (NOTE) Breakpoints have been established for only some organism-drug combinations as indicated. This test was developed and its performance characteristics determined by [facility]. It has  not been cleared or approved by the Food and Drug Administration.    Anidulafungin MIC Comment  Final    Comment: (NOTE) 0.12 ug/mL Susceptible Breakpoints have been established for only some organism-drug combinations as indicated. This test was developed and its performance characteristics determined by [facility]. It has not been cleared or approved by the Food and Drug Administration.    Caspofungin MIC Comment  Final    Comment: (NOTE) 0.5 ug/mL Intermediate Breakpoints have been established for only some organism-drug combinations as indicated. This test was developed and its performance characteristics determined by [facility]. It has not been cleared or approved by the Food and Drug Administration.    Micafungin MIC Comment  Final  Comment: (NOTE) 0.25 ug/mL Susceptible Breakpoints have been established for only some organism-drug combinations as indicated. This test was developed and its performance characteristics determined by [facility]. It has not been cleared or approved by the Food and Drug Administration.    Posaconazole MIC 0.5 ug/mL  Final    Comment: (NOTE) Breakpoints have been established for only some organism-drug combinations as indicated. This test was developed and its performance characteristics determined by [facility]. It has not been cleared or approved by the Food and Drug Administration.    Fluconazole Islt MIC 64.0 ug/mL  Final    Comment: (NOTE) This organism is inherently resistant to fluconazole. Breakpoints have been established for only some organism-drug combinations as indicated. This test was developed and its performance characteristics determined by [facility]. It has not been cleared or approved by the Food and Drug Administration.    Flucytosine MIC 8.0 ug/mL  Final    Comment: (NOTE) Breakpoints have been established for only some organism-drug combinations as indicated. This test was developed and its performance  characteristics determined by [facility]. It has not been cleared or approved by the Food and Drug Administration.    Itraconazole MIC 0.5 ug/mL  Final    Comment: (NOTE) Breakpoints have been established for only some organism-drug combinations as indicated. This test was developed and its performance characteristics determined by [facility]. It has not been cleared or approved by the Food and Drug Administration.    Voriconazole MIC 0.5 ug/mL Susceptible  Final    Comment: (NOTE) Breakpoints have been established for only some organism-drug combinations as indicated. This test was developed and its performance characteristics determined by [facility]. It has not been cleared or approved by the Food and Drug Administration. Performed At: [facility] [place], [place] [id number] Rush Farmer MD VX:[id number]    Source [id number] C.KRUSEI 9 DRUGS FUNGAL PANEL BLOOD CULTURE  Final    Comment: Performed at [facility], [place] [address] [place], [place] [address]  MRSA Next Gen by PCR, Nasal     Status: None   Collection Time: 01/04/21  4:58 PM   Specimen: Nasal Mucosa; Nasal Swab  Result Value Ref Range Status   MRSA by PCR Next Gen NOT DETECTED NOT DETECTED Final    Comment: (NOTE) The GeneXpert MRSA Assay (FDA approved for NASAL specimens only), is one component of a comprehensive MRSA colonization surveillance program. It is not intended to diagnose MRSA infection nor to guide or monitor treatment for MRSA infections. Test performance is not FDA approved in patients less than 8 years old. Performed at [facility], [address], [place], [place] [address]   Urine Culture     Status: Abnormal   Collection Time: 01/04/21  8:50 PM   Specimen: Urine, Clean Catch  Result Value Ref Range Status   Specimen Description   Final    URINE, CLEAN CATCH Performed at [facility], [address] [place], [place] [address]    Special Requests    Final    NONE Performed at [facility], [address], [place], [place] [address]    Culture (A)  Final    <10,000 COLONIES/mL INSIGNIFICANT GROWTH Performed at [facility] [address] [place], [place] [address]    Report Status 01/07/2021 FINAL  Final  Gastrointestinal Panel by PCR , Stool     Status: None   Collection Time: 01/05/21  3:01 PM   Specimen: Stool  Result Value Ref Range  Status   Campylobacter species NOT DETECTED NOT DETECTED Final   Plesimonas shigelloides NOT DETECTED NOT DETECTED Final   Salmonella species NOT DETECTED NOT DETECTED Final   Yersinia enterocolitica NOT DETECTED NOT DETECTED Final   Vibrio species NOT DETECTED NOT DETECTED Final   Vibrio cholerae NOT DETECTED NOT DETECTED Final   Enteroaggregative E coli (EAEC) NOT DETECTED NOT DETECTED Final   Enteropathogenic E coli (EPEC) NOT DETECTED NOT DETECTED Final   Enterotoxigenic E coli (ETEC) NOT DETECTED NOT DETECTED Final   Shiga like toxin producing E coli (STEC) NOT DETECTED NOT DETECTED Final   Shigella/Enteroinvasive E coli (EIEC) NOT DETECTED NOT DETECTED Final   Cryptosporidium NOT DETECTED NOT DETECTED Final   Cyclospora cayetanensis NOT DETECTED NOT DETECTED Final   Entamoeba histolytica NOT DETECTED NOT DETECTED Final   Giardia lamblia NOT DETECTED NOT DETECTED Final   Adenovirus F40/41 NOT DETECTED NOT DETECTED Final   Astrovirus NOT DETECTED NOT DETECTED Final   Norovirus GI/GII NOT DETECTED NOT DETECTED Final   Rotavirus A NOT DETECTED NOT DETECTED Final   Sapovirus (I, II, IV, and V) NOT DETECTED NOT DETECTED Final    Comment: Performed at [facility], [address] [place], [place] [address]  C Difficile Quick Screen w PCR reflex     Status: Abnormal   Collection Time: 01/05/21  3:01 PM   Specimen: STOOL  Result Value Ref Range Status   C Diff antigen POSITIVE (A) NEGATIVE Final   C Diff toxin NEGATIVE NEGATIVE Final   C Diff interpretation  Results are indeterminate. See PCR results.  Final    Comment: Performed at [facility], [address] [place], [place] [address]  C. Diff by PCR, Reflexed     Status: None   Collection Time: 01/05/21  3:01 PM  Result Value Ref Range Status   Toxigenic C. Difficile by PCR NEGATIVE NEGATIVE Final    Comment: Patient is colonized with non toxigenic C. difficile. May not need treatment unless significant symptoms are present. Performed at [facility], [address] [place], [place] [address]   Culture, blood (single)     Status: None   Collection Time: 01/05/21  4:42 PM   Specimen: BLOOD  Result Value Ref Range Status   Specimen Description BLOOD BLOOD LEFT HAND  Final   Special Requests   Final    BOTTLES DRAWN AEROBIC AND ANAEROBIC Blood Culture adequate volume   Culture   Final    NO GROWTH 5 DAYS Performed at [facility] [address], [place], [place] [address]    Report Status 01/10/2021 FINAL  Final  CSF culture w Gram Stain     Status: None   Collection Time: 01/07/21  2:43 PM   Specimen: PATH Cytology CSF; Cerebrospinal Fluid  Result Value Ref Range Status   Specimen Description   Final    CSF Performed at [facility], [address], [place], [place] [address]    Special Requests   Final    NONE Performed at [facility], [address] [place], [place] [address]    Gram Stain   Final    NO ORGANISMS SEEN WBC PRESENT, PREDOMINANTLY MONONUCLEAR CRITICAL RESULT CALLED TO, READ BACK BY AND VERIFIED WITH: Delphina Cahill, RN AT [phone] 01/07/21 BY JRH    Culture   Final    NO GROWTH 3 DAYS Performed at [facility], [place] [address], [place], [place] [address]    Report  Status 01/10/2021 FINAL  Final  Fungus Culture With Stain     Status: None (Preliminary result)   Collection Time: 01/07/21  2:43 PM   Specimen: PATH Cytology CSF; Cerebrospinal Fluid  Result Value Ref Range Status   Fungus Stain Final  report  Final    Comment: (NOTE) Performed At: [facility] [place], [place] [id number] Rush Farmer MD [id number]    Fungus (Mycology) Culture PENDING  Incomplete   Fungal Source CSF  Final    Comment: Performed at [facility], [address] [place], [place] [address]  Fungus Culture Result     Status: None   Collection Time: 01/07/21  2:43 PM  Result Value Ref Range Status   Result 1 Comment  Final    Comment: (NOTE) KOH/Calcofluor preparation:  no fungus observed. Performed At: [facility] [place], [place] [id number] Rush Farmer MD YP:[id number]     Studies/Results: DG Skull 1-3 Views  Result Date: 01/12/2021 CLINICAL DATA:  Rule out foreign body for MRI. EXAM: SKULL - 1-3 VIEW COMPARISON:  01/06/2021 FINDINGS: A right frontal approach ventriculostomy catheter remains in place with tip terminating near the midline. The included portion of the shunt tubing in the head and neck appears grossly intact. The shunt is set at 160 mm H2O which represents a change from the prior study. No other radiopaque foreign body is identified. IMPRESSION: Shunt set at 160 mm H2O., higher than on 01/06/2021 Electronically Signed   By: Logan Bores M.D.   On: 01/12/2021 13:58   MR BRAIN WO CONTRAST  Result Date: 01/12/2021 CLINICAL DATA:  Anoxic brain damage. EXAM: MRI HEAD WITHOUT CONTRAST TECHNIQUE: Multiplanar, multiecho pulse sequences of the brain and surrounding structures were obtained without intravenous contrast. COMPARISON:  MRI of the brain January 06, 2021. FINDINGS: Brain: New cortical foci of restricted diffusion in the right frontoparietal convexity, consistent with recent infarcts. Persistent mild hyperintensity within the right temporoparietal region without corresponding low signal on ADC suggesting subacute infarct. 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 Interval decrease in size of the right cerebral  convexity subdural fluid collection 6 mm (8 mm on prior) with minimal mass effect on the adjacent brain parenchyma. Right frontal approach ventricular drain with the tip at the body of the right lateral ventricle. There is no hydrocephalus. Again seen better in of a right parietal gyrus at the level of a burr hole. Low lying cerebellar tonsils extending to the C2 level. Vascular: Absent flow void of the left vertebral artery and proximal intracranial right ICA, corresponding to occlusion best described or CT angiogram performed on January 06, 2021. Skull and upper cervical spine: Postsurgical changes from prior suboccipital craniotomy and cervical laminectomy Sinuses/Orbits: Fluid level within the right maxillary sinus and bilateral sphenoid sinuses. The orbits are maintained. Other: Bilateral mastoid effusion. IMPRESSION: 1. 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 2. 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 No midline shift. 3. Inflammatory paranasal sinus and mastoid disease, new since prior MRI. Electronically Signed   By: Pedro Earls M.D.   On: 01/12/2021 13:22   DG Chest Port 1 View  Result Date: 01/11/2021 CLINICAL DATA:  Post cardiac arrest, intubated EXAM: PORTABLE CHEST 1 VIEW COMPARISON:  01/10/2021 FINDINGS: Endotracheal tube is 2.6 cm above the carina. Temperature probe within the esophagus. Partially imaged shunt catheter traversing the right neck, chest, and abdomen.  Persistent patchy bilateral opacities with worsening aeration at the right lung base. Probable small right pleural effusion. No pneumothorax. Stable cardiomegaly. IMPRESSION: Lines and tubes as above. Persistent patchy bilateral opacities with worsening of right lung base aeration. May reflect edema or pneumonia. Electronically Signed   By: Macy Mis M.D.   On: 01/11/2021 08:30    Assessment/Plan: Carolyn Herring is a 45 y.o.  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 Admitted with falls, weakness and found to have profound anemia. Had large ulcer from NSAIDS noted on EGD. Initially WBC 30 but no fevers. BCX neg initially but now growing Candida krusei. She seems to be back to her baseline mentally. She has CT with possible subacute infarct and possible small SDH.  She denies any recent shunt issues. Unclear if she is neurologically at her baseline.    The candidemia source is most likely the gastric ulcer and translocation from the gut.  I am not convinced the shunt is infected as no peritonitis, no obvious change in her mental status. LP shows 20 wbc and elevated protein reviewed with neurosurgery and still not really indicative of infection so will monitor. TEE neg for endocarditis  October 17 had recurrent bleeding and is back in  ICU.  White blood count had increased to 38.5 but is now down to 23.  She was restarted on Unasyn.  CSF cultures remain negative  OCt 18, extubated, febrile. MRI showed multiple small infarcts  Recommendations Cont eraxis. Sensis Shows S to voriconazole so can change to that at dc if stable. .  If repeat fevers repeat bcx, check UA and CXR.   Cont   unasyn for Aspiration PNA. Cont for 7- 10 day course.   Thank you very much for the consult. Will follow with you.  Leonel Ramsay   01/12/2021, 3:20 PM

## 2021-01-12 NOTE — Progress Notes (Signed)
[facility] SURGICAL PROGRESS NOTE (cpt [phone])  Hospital Day(s): 8.   Post op day(s): 2 Days Post-Op.   Interval History:  Patient seen and examined She remains intubated  She is on 4 mcg Norepinephrine this morning  Leukocytosis up some to 25.9K.  Hgb remains stable at 14.2 after massive transfusion.  Renal function is normalized; sCr - 0.97; UO - 1.4L Previous numerous electrolyte derangements are now resolved She does have OG without further evidence of bleeding.   Review of Systems:  Unable to reliably preform secondary to intubation  Vital signs in last 24 hours: [min-max] current  Temp:  [99.1 F (37.3 C)-100.6 F (38.1 C)] 100.6 F (38.1 C) (10/18 0715) Pulse Rate:  [81-105] 96 (10/18 0715) Resp:  [16-30] 26 (10/18 0715) BP: (87-113)/(53-74) 111/67 (10/18 0000) SpO2:  [92 %-100 %] 95 % (10/18 0715) Arterial Line BP: (86-153)/(43-90) 106/57 (10/18 0715) FiO2 (%):  [28 %-50 %] 28 % (10/18 0700)     Height: 5' (152.4 cm) Weight: 54.3 kg BMI (Calculated): 23.38   Intake/Output last 2 shifts:  10/17 0701 - 10/18 0700 In: 1913.8 [I.V.:1378.6; IV Piggyback:535.3] Out: 1450 [Urine:1450]   Physical Exam:  Constitutional: Intubated, sedated HENT: normocephalic without obvious abnormality; OG in place without bleeding Respiratory: intubated, on ventilator Cardiovascular: tachycardic and sinus rhythm  Gastrointestinal: Soft, unable to reliably assess tenderness, non-distended   Labs:  CBC Latest Ref Rng & Units 01/12/2021 01/11/2021 01/11/2021  WBC 4.0 - 10.5 K/uL 25.9(H) - 23.8(H)  Hemoglobin 12.0 - 15.0 g/dL 09.9 83.3 82.5  Hematocrit 36.0 - 46.0 % 41.4 41.9 40.9  Platelets 150 - 400 K/uL 133(L) - 103(L)   CMP Latest Ref Rng & Units 01/12/2021 01/11/2021 01/11/2021  Glucose 70 - 99 mg/dL 053(Z) - 767(H)  BUN 6 - 20 mg/dL 13 - 12  Creatinine 4.19 - 1.00 mg/dL 3.79 - 0.24  Sodium 097 - 145 mmol/L 135 - 139  Potassium 3.5 - 5.1 mmol/L 4.2 4.3  2.5(LL)  Chloride 98 - 111 mmol/L 101 - 102  CO2 22 - 32 mmol/L 27 - 29  Calcium 8.9 - 10.3 mg/dL 7.3(L) 6.9(L) 6.3(LL)  Total Protein 6.5 - 8.1 g/dL - - -  Total Bilirubin 0.3 - 1.2 mg/dL - - -  Alkaline Phos 38 - 126 U/L - - -  AST 15 - 41 U/L - - -  ALT 0 - 44 U/L - - -     Imaging studies: No new pertinent imaging studies   Assessment/Plan: (ICD-10's: R57.8) 45 y.o. female with, now stable, anemia secondary to massive GI hemorrhage from gastric ulcer, complicated by cardiac arrest   - No plans for surgical intervention at this time. Patient remains critically ill but thankfully has remained without signs of bleeding and Hgb remained stable s/p transfusion. If she has any signs of recurrence, may need to proceed emergently  - Monitor H&H closely; transfuse as needed  - Further management per primary service; we will follow closely    All of the above findings and recommendations were discussed with the medical team.  -- Lynden Oxford, PA-C [facility] 01/12/2021, 7:28 AM [phone] M-F: 7am - 4pm

## 2021-01-12 NOTE — Consult Note (Signed)
Consultation Note Date: 01/12/2021   Patient Name: Carolyn Herring  DOB: 1975/10/21  MRN: [id number]  Age / Sex: 45 y.o., female  PCP: Patient, No Pcp Per (Inactive) Referring Physician: Flora Lipps, MD  Reason for Consultation: Establishing goals of care  HPI/Patient Profile: 45 y.o. female  with past medical history of hypertension, hydrocephalus, and spina bifida with a VP shunt with cervical tethered cord release in 2011 (revised in 2019) admitted on 01/04/2021 with recurrent falls, generalized weakness, and back pain.  She endorses generalized weakness since she was diagnosed with COVID-19 in June 2022.  She has not seen her primary care provider for any of the symptoms and has not seen a neurosurgeon in over 3 years.  Patient was found to have fungemia.  She also had large antral ulcer.  She had melanotic stools with a significant drop in her hemoglobin and hematocrit.  GI was consulted and EGD was planned.  During scope she developed PEA arrest and respiratory distress after aspiration of blood.  CODE BLUE occurred and patient was resuscitated.  She received multiple units of blood as well as fresh frozen plasma and platelet transfusions during resuscitation.  She was intubated.  She was successfully extubated 10/18.  Palliative medicine team has been consulted to discuss goals of care.  Patient remains a full code.  Clinical Assessment and Goals of Care: I have reviewed medical records including EPIC notes, labs and imaging, received report from Dr. Mortimer Fries, assessed the patient and then met with patient and patient's brother Clinton at bedside to discuss diagnosis prognosis, GOC, EOL wishes, disposition and options.  Of note, there was another family member present in the patient's room but the patient asked her to leave.  Patient wanted to ensure that discussion was had only in front of her her brother  [place].  I introduced Palliative Medicine as specialized medical care for people living with serious illness. It focuses on providing relief from the symptoms and stress of a serious illness. The goal is to improve quality of life for both the patient and the family.  We discussed a brief life review of the patient.  Clinton shared that he increased he had a hard life growing up.  She was in and out of the hospital frequently.  Their dad and mother had hardships financially and personally.  Both [place] and [place] were separated and placed into foster care at separate times in their lives.  He said they were not raised together but have come back together as adults.  They live near 1 another and keep in touch now.  Alli has 1 daughter who is 72 years old.   We discussed patient's current illness and what it means in the larger context of patient's on-going co-morbidities.  I attempted to elicit values and goals of care important to the patient.  [name] shares that he is taking it 1 day at a time.  He said that since she survived the other CODE BLUE and has made small steps in recovery that  he is hopeful she will continue to make recovery.  Patient nodded in agreement with all of this.  She reports she does not like sitting up but understands that this is helpful in creating a productive cough.    I inquired about advanced care planning.  She endorses that she would like Clinton to be the one to make all of her decisions if she is unable to speak for herself.  Patient has no documents on file and has never completed advanced care planning documentation.  I explained that the spiritual care team can help facilitate discussions and completion of advanced care planning.  Brelynn shared she would like to meet with him.  Advance directives, concepts specific to code status, artificial feeding and hydration, and rehospitalization were discussed.  I gave a brief overview of what a CODE BLUE entails since the  patient endorsed that she does not really know what all happened when she had her "event".  She confirms that she does not want to change anything about her CODE STATUS today.  Clinton is in agreement.  I reiterated that these discussions are fluid and across the continuum. Discussed with patient/family the importance of continued conversation with family and the medical providers regarding overall plan of care and treatment options, ensuring decisions are within the context of the patient's values and GOCs.    Clinton asked appropriate questions about the patient's pneumonia and blood clots.  Questions and concerns were addressed.  The family was encouraged to call with questions or concerns.   I shared both the patient and Truddie Crumble that I will continue to monitor the patient's progress and/or decline during this hospitalization.  I will continue to round on the patient daily.   Recommendations/Plan Referral for Spiritual Care for ACP discussion and documentation Remain FULL CODE Treat the treatable Open to any offered, available, and appropriate medical interventions  Primary Decision Maker PATIENT  Code Status/Advance Care Planning: Full code  Prognosis:   Unable to determine  Discharge Planning: To Be Determined  Primary Diagnoses: Present on Admission:  Hemorrhagic shock ([place])   Physical Exam Constitutional:      Appearance: Normal appearance.  HENT:     Head: Normocephalic.  Cardiovascular:     Rate and Rhythm: Normal rate.  Pulmonary:     Effort: Pulmonary effort is normal.     Comments: 4L HFNC Skin:    General: Skin is warm.  Neurological:     Mental Status: She is alert. Mental status is at baseline.  Psychiatric:        Mood and Affect: Mood normal.        Behavior: Behavior normal.        Thought Content: Thought content normal.        Judgment: Judgment normal.    Vital Signs: BP 116/69   Pulse 98   Temp (S) 99.3 F (37.4 C) (Axillary)   Resp 16   Ht  5' (1.524 m)   Wt 54.3 kg   SpO2 95%   BMI 23.38 kg/m  Pain Scale: CPOT   Pain Score: 1  SpO2: SpO2: 95 % O2 Device:SpO2: 95 % O2 Flow Rate: .O2 Flow Rate (L/min): 45 L/min  Palliative Assessment/Data: 40%     I discussed this patient's plan of care with attending Dr. Mortimer Fries, patient, patient's brother.  Thank you for this consult. Palliative medicine will continue to follow and assist holistically.   Time Total: 70 minutes Greater than 50%  of this time was spent  counseling and coordinating care related to the above assessment and plan.  Signed by: Jordan Hawks, DNP, FNP-BC Palliative Medicine    Please contact Palliative Medicine Team phone at [phone] for questions and concerns.  For individual provider: See Shea Evans

## 2021-01-12 NOTE — Anesthesia Postprocedure Evaluation (Signed)
Anesthesia Post Note  Patient: Carolyn Herring  Procedure(s) Performed: TRANSESOPHAGEAL ECHOCARDIOGRAM (TEE)  Patient location during evaluation: PACU Anesthesia Type: General Level of consciousness: awake and alert Pain management: pain level controlled Vital Signs Assessment: post-procedure vital signs reviewed and stable Respiratory status: spontaneous breathing, nonlabored ventilation, respiratory function stable and patient connected to nasal cannula oxygen Cardiovascular status: blood pressure returned to baseline and stable Postop Assessment: no apparent nausea or vomiting Anesthetic complications: no   No notable events documented.   Last Vitals:  Vitals:   01/12/21 0715 01/12/21 0730  BP:    Pulse: 96 (!) 105  Resp: (!) 26 (!) 26  Temp: (!) 38.1 C (!) 38.1 C  SpO2: 95% 93%    Last Pain:  Vitals:   01/12/21 0700  TempSrc: Esophageal  PainSc:                  Yevette Edwards

## 2021-01-13 DIAGNOSIS — D500 Iron deficiency anemia secondary to blood loss (chronic): Secondary | ICD-10-CM

## 2021-01-13 DIAGNOSIS — I63511 Cerebral infarction due to unspecified occlusion or stenosis of right middle cerebral artery: Secondary | ICD-10-CM | POA: Diagnosis not present

## 2021-01-13 DIAGNOSIS — Z515 Encounter for palliative care: Secondary | ICD-10-CM

## 2021-01-13 DIAGNOSIS — K254 Chronic or unspecified gastric ulcer with hemorrhage: Secondary | ICD-10-CM | POA: Diagnosis not present

## 2021-01-13 DIAGNOSIS — J9601 Acute respiratory failure with hypoxia: Secondary | ICD-10-CM | POA: Diagnosis not present

## 2021-01-13 DIAGNOSIS — Z789 Other specified health status: Secondary | ICD-10-CM

## 2021-01-13 DIAGNOSIS — R578 Other shock: Secondary | ICD-10-CM | POA: Diagnosis not present

## 2021-01-13 LAB — BASIC METABOLIC PANEL
Anion gap: 11 (ref 5–15)
BUN: 14 mg/dL (ref 6–20)
CO2: 21 mmol/L — ABNORMAL LOW (ref 22–32)
Calcium: 8.2 mg/dL — ABNORMAL LOW (ref 8.9–10.3)
Chloride: 107 mmol/L (ref 98–111)
Creatinine, Ser: 0.55 mg/dL (ref 0.44–1.00)
GFR, Estimated: >60 mL/min (ref >60)
Glucose, Bld: 61 mg/dL — ABNORMAL LOW (ref 70–99)
Potassium: 3.8 mmol/L (ref 3.5–5.1)
Sodium: 139 mmol/L (ref 135–145)

## 2021-01-13 LAB — CBC WITH DIFFERENTIAL/PLATELET
Abs Immature Granulocytes: 0.18 10*3/uL — ABNORMAL HIGH (ref 0.00–0.07)
Basophils Absolute: 0.1 10*3/uL (ref 0.0–0.1)
Basophils Relative: 0 %
Eosinophils Absolute: 0.4 10*3/uL (ref 0.0–0.5)
Eosinophils Relative: 2 %
HCT: 41.8 % (ref 36.0–46.0)
Hemoglobin: 13.5 g/dL (ref 12.0–15.0)
Immature Granulocytes: 1 %
Lymphocytes Relative: 3 %
Lymphs Abs: 0.6 10*3/uL — ABNORMAL LOW (ref 0.7–4.0)
MCH: 27.8 pg (ref 26.0–34.0)
MCHC: 32.3 g/dL (ref 30.0–36.0)
MCV: 86.2 fL (ref 80.0–100.0)
Monocytes Absolute: 0.8 10*3/uL (ref 0.1–1.0)
Monocytes Relative: 4 %
Neutro Abs: 17.1 10*3/uL — ABNORMAL HIGH (ref 1.7–7.7)
Neutrophils Relative %: 90 %
Platelets: 160 10*3/uL (ref 150–400)
RBC: 4.85 MIL/uL (ref 3.87–5.11)
RDW: 18.3 % — ABNORMAL HIGH (ref 11.5–15.5)
WBC: 19.1 10*3/uL — ABNORMAL HIGH (ref 4.0–10.5)
nRBC: 0 % (ref 0.0–0.2)

## 2021-01-13 LAB — GLUCOSE, CAPILLARY
Glucose-Capillary: 59 mg/dL — ABNORMAL LOW (ref 70–99)
Glucose-Capillary: 68 mg/dL — ABNORMAL LOW (ref 70–99)
Glucose-Capillary: 71 mg/dL (ref 70–99)

## 2021-01-13 LAB — MAGNESIUM: Magnesium: 2 mg/dL (ref 1.7–2.4)

## 2021-01-13 LAB — PHOSPHORUS: Phosphorus: 3.4 mg/dL (ref 2.5–4.6)

## 2021-01-13 LAB — TSH: TSH: 0.94 u[IU]/mL (ref 0.350–4.500)

## 2021-01-13 MED ORDER — SODIUM CHLORIDE 0.9% FLUSH: 10.0000 mL | INTRAVENOUS | Status: DC | PRN

## 2021-01-13 MED ORDER — DEXTROSE IN LACTATED RINGERS 5 % IV SOLN: INTRAVENOUS | Status: DC

## 2021-01-13 MED ORDER — VITAMIN B-12 1000 MCG PO TABS
1000.0000 ug | ORAL_TABLET | Freq: Every day | ORAL | Status: DC
  Administered 2021-01-14 – 2021-01-18 (×5): 1000 ug via ORAL
  Filled 2021-01-13 (×5): qty 1

## 2021-01-13 MED ORDER — CYANOCOBALAMIN 1000 MCG/ML IJ SOLN
1000.0000 ug | Freq: Once | INTRAMUSCULAR | Status: AC
  Administered 2021-01-13: 1000 ug via INTRAMUSCULAR
  Filled 2021-01-13: qty 1

## 2021-01-13 MED ORDER — SODIUM CHLORIDE 0.9% FLUSH
10.0000 mL | Freq: Two times a day (BID) | INTRAVENOUS | Status: DC
  Administered 2021-01-13 (×2): 10 mL
  Administered 2021-01-14: 30 mL
  Administered 2021-01-14: 10 mL
  Administered 2021-01-15: 20 mL
  Administered 2021-01-15 – 2021-01-18 (×5): 10 mL

## 2021-01-13 MED ORDER — DEXTROSE 50 % IV SOLN
INTRAVENOUS | Status: AC
  Administered 2021-01-13: 50 mL
  Filled 2021-01-13: qty 50

## 2021-01-13 NOTE — Progress Notes (Signed)
[facility] PROGRESS NOTE Date of Admission:  01/04/2021     ID: Carolyn Herring is a 45 y.o. female with  Candidemia Active Problems:   Hemorrhagic shock ([place])   Fall   Altered mental status   History of brain shunt   Pressure injury of skin   Complication of ventricular intracranial shunt   Endotracheal tube present   Acute respiratory failure (HCC)   On mechanically assisted ventilation (HCC)   Cardiac arrest with pulseless electrical activity ([place])   Fungemia   Hypokalemia   Metabolic acidosis   Palliative care by specialist   Full code status   Subjective: Remains alert. Low grade fever. MRI with multiple small infarcts  ROS unable to obtain Medications:  Antibiotics Given (last 72 hours)     Date/Time Action Medication Dose Rate   01/10/21 1816 New Bag/Given   Ampicillin-Sulbactam (UNASYN) 3 g in sodium chloride 0.9 % 100 mL IVPB 3 g 200 mL/hr   01/11/21 0010 New Bag/Given   Ampicillin-Sulbactam (UNASYN) 3 g in sodium chloride 0.9 % 100 mL IVPB 3 g 200 mL/hr   01/11/21 0920 New Bag/Given   Ampicillin-Sulbactam (UNASYN) 3 g in sodium chloride 0.9 % 100 mL IVPB 3 g 200 mL/hr   01/11/21 1419 New Bag/Given   Ampicillin-Sulbactam (UNASYN) 3 g in sodium chloride 0.9 % 100 mL IVPB 3 g 200 mL/hr   01/11/21 1959 New Bag/Given   Ampicillin-Sulbactam (UNASYN) 3 g in sodium chloride 0.9 % 100 mL IVPB 3 g 200 mL/hr   01/12/21 0156 New Bag/Given   Ampicillin-Sulbactam (UNASYN) 3 g in sodium chloride 0.9 % 100 mL IVPB 3 g 200 mL/hr   01/12/21 0930 New Bag/Given   Ampicillin-Sulbactam (UNASYN) 3 g in sodium chloride 0.9 % 100 mL IVPB 3 g 200 mL/hr   01/12/21 1432 New Bag/Given   Ampicillin-Sulbactam (UNASYN) 3 g in sodium chloride 0.9 % 100 mL IVPB 3 g 200 mL/hr   01/12/21 2003 New Bag/Given   Ampicillin-Sulbactam (UNASYN) 3 g in sodium chloride 0.9 % 100 mL IVPB 3 g 200 mL/hr   01/13/21 0250 New Bag/Given   Ampicillin-Sulbactam (UNASYN) 3 g in sodium  chloride 0.9 % 100 mL IVPB 3 g 200 mL/hr   01/13/21 0741 New Bag/Given   Ampicillin-Sulbactam (UNASYN) 3 g in sodium chloride 0.9 % 100 mL IVPB 3 g 200 mL/hr   01/13/21 1411 New Bag/Given   Ampicillin-Sulbactam (UNASYN) 3 g in sodium chloride 0.9 % 100 mL IVPB 3 g 200 mL/hr       budesonide (PULMICORT) nebulizer solution  0.5 mg Nebulization BID   chlorhexidine gluconate (MEDLINE KIT)  15 mL Mouth Rinse BID   Chlorhexidine Gluconate Cloth  6 each Topical Daily   ipratropium-albuterol  3 mL Nebulization Q6H   mouth rinse  15 mL Mouth Rinse 10 times per day   pantoprazole (PROTONIX) IV  40 mg Intravenous BID   sodium chloride flush  10-40 mL Intracatheter Q12H   [START ON 01/14/2021] vitamin B-12  1,000 mcg Oral Daily    Objective: Vital signs in last 24 hours: Temp:  [98 F (36.7 C)-99 F (37.2 C)] 99 F (37.2 C) (10/19 0000) Pulse Rate:  [94-132] 94 (10/19 1130) Resp:  [16-25] 20 (10/19 1130) BP: (116-150)/(69-93) 137/87 (10/19 1100) SpO2:  [93 %-100 %] 98 % (10/19 1353) Arterial Line BP: (113-166)/(75-115) 135/76 (10/19 1000) FiO2 (%):  [40 %-50 %] 40 % (10/19 0740) Constitutional:  extubated.  Mouth/Throat:op clear  Cardiovascular: Normal  rate, regular rhythm and normal heart sounds.  Pulmonary/Chest: mech breath sounds Neck = supple, no nuchal rigidity Abdominal: Soft. Bowel sounds are normal.  Neurological: intubated sedated extremeties. Some contractures on hands Skin: Skin is warm and dry.   Lab Results Recent Labs    01/12/21 0530 01/13/21 0950  WBC 25.9* 19.1*  HGB 14.2 13.5  HCT 41.4 41.8  NA 135 139  K 4.2 3.8  CL 101 107  CO2 27 21*  BUN 13 14  CREATININE 0.67 0.55    Microbiology: Results for orders placed or performed during the hospital encounter of 01/04/21  Resp Panel by RT-PCR (Flu A&B, Covid) Nasopharyngeal Swab     Status: None   Collection Time: 01/04/21 12:05 PM   Specimen: Nasopharyngeal Swab; Nasopharyngeal(NP) swabs in vial transport  medium  Result Value Ref Range Status   SARS Coronavirus 2 by RT PCR NEGATIVE NEGATIVE Final    Comment: (NOTE) SARS-CoV-2 target nucleic acids are NOT DETECTED.  The SARS-CoV-2 RNA is generally detectable in upper respiratory specimens during the acute phase of infection. The lowest concentration of SARS-CoV-2 viral copies this assay can detect is 138 copies/mL. A negative result does not preclude SARS-Cov-2 infection and should not be used as the sole basis for treatment or other patient management decisions. A negative result may occur with  improper specimen collection/handling, submission of specimen other than nasopharyngeal swab, presence of viral mutation(s) within the areas targeted by this assay, and inadequate number of viral copies(<138 copies/mL). A negative result must be combined with clinical observations, patient history, and epidemiological information. The expected result is Negative.  Fact Sheet for Patients:  EntrepreneurPulse.com.au  Fact Sheet for Healthcare Providers:  IncredibleEmployment.be  This test is no t yet approved or cleared by the [place] FDA and  has been authorized for detection and/or diagnosis of SARS-CoV-2 by FDA under an Emergency Use Authorization (EUA). This EUA will remain  in effect (meaning this test can be used) for the duration of the COVID-19 declaration under Section 564(b)(1) of the Act, 21 U.S.C.section 360bbb-3(b)(1), unless the authorization is terminated  or revoked sooner.       Influenza A by PCR NEGATIVE NEGATIVE Final   Influenza B by PCR NEGATIVE NEGATIVE Final    Comment: (NOTE) The Xpert Xpress SARS-CoV-2/FLU/RSV plus assay is intended as an aid in the diagnosis of influenza from Nasopharyngeal swab specimens and should not be used as a sole basis for treatment. Nasal washings and aspirates are unacceptable for Xpert Xpress SARS-CoV-2/FLU/RSV testing.  Fact Sheet for  Patients: EntrepreneurPulse.com.au  Fact Sheet for Healthcare Providers: IncredibleEmployment.be  This test is not yet approved or cleared by the [place] FDA and has been authorized for detection and/or diagnosis of SARS-CoV-2 by FDA under an Emergency Use Authorization (EUA). This EUA will remain in effect (meaning this test can be used) for the duration of the COVID-19 declaration under Section 564(b)(1) of the Act, 21 U.S.C. section 360bbb-3(b)(1), unless the authorization is terminated or revoked.  Performed at [facility], [address] [place], [place] [address]   Blood culture (routine single)     Status: Abnormal (Preliminary result)   Collection Time: 01/04/21  2:58 PM   Specimen: BLOOD  Result Value Ref Range Status   Specimen Description   Final    BLOOD LEFT ANTECUBITAL Performed at [facility], [address], [place], [place] [address]    Special Requests   Final    BOTTLES DRAWN AEROBIC AND  ANAEROBIC Blood Culture adequate volume Performed at [facility], [address] [place], [place] [address]    Culture  Setup Time   Final    YEAST ANAEROBIC BOTTLE ONLY Organism ID to follow CRITICAL RESULT CALLED TO, READ BACK BY AND VERIFIED WITH: SAMANTHA RAUER 01/05/21 1507 SLM IN BOTH AEROBIC AND ANAEROBIC BOTTLES Performed at [facility], [address] [place], [place] [address]    Culture (A)  Final    CANDIDA KRUSEI Sent to [place] for further susceptibility testing. Performed at [facility], [place] [address], [place], [place] [address]    Report Status PENDING  Incomplete  Blood Culture ID Panel (Reflexed)     Status: Abnormal   Collection Time: 01/04/21  2:58 PM  Result Value Ref Range Status   Enterococcus faecalis NOT DETECTED NOT DETECTED Final   Enterococcus Faecium NOT DETECTED NOT DETECTED Final   Listeria monocytogenes NOT DETECTED NOT DETECTED Final    Staphylococcus species NOT DETECTED NOT DETECTED Final   Staphylococcus aureus (BCID) NOT DETECTED NOT DETECTED Final   Staphylococcus epidermidis NOT DETECTED NOT DETECTED Final   Staphylococcus lugdunensis NOT DETECTED NOT DETECTED Final   Streptococcus species NOT DETECTED NOT DETECTED Final   Streptococcus agalactiae NOT DETECTED NOT DETECTED Final   Streptococcus pneumoniae NOT DETECTED NOT DETECTED Final   Streptococcus pyogenes NOT DETECTED NOT DETECTED Final   A.calcoaceticus-baumannii NOT DETECTED NOT DETECTED Final   Bacteroides fragilis NOT DETECTED NOT DETECTED Final   Enterobacterales NOT DETECTED NOT DETECTED Final   Enterobacter cloacae complex NOT DETECTED NOT DETECTED Final   Escherichia coli NOT DETECTED NOT DETECTED Final   Klebsiella aerogenes NOT DETECTED NOT DETECTED Final   Klebsiella oxytoca NOT DETECTED NOT DETECTED Final   Klebsiella pneumoniae NOT DETECTED NOT DETECTED Final   Proteus species NOT DETECTED NOT DETECTED Final   Salmonella species NOT DETECTED NOT DETECTED Final   Serratia marcescens NOT DETECTED NOT DETECTED Final   Haemophilus influenzae NOT DETECTED NOT DETECTED Final   Neisseria meningitidis NOT DETECTED NOT DETECTED Final   Pseudomonas aeruginosa NOT DETECTED NOT DETECTED Final   Stenotrophomonas maltophilia NOT DETECTED NOT DETECTED Final   Candida albicans NOT DETECTED NOT DETECTED Final   Candida auris NOT DETECTED NOT DETECTED Final   Candida glabrata NOT DETECTED NOT DETECTED Final   Candida krusei DETECTED (A) NOT DETECTED Final    Comment: CRITICAL RESULT CALLED TO, READ BACK BY AND VERIFIED WITH: SAMANTHA RAUER 01/05/21 1507 SLM    Candida parapsilosis NOT DETECTED NOT DETECTED Final   Candida tropicalis NOT DETECTED NOT DETECTED Final   Cryptococcus neoformans/gattii NOT DETECTED NOT DETECTED Final    Comment: Performed at [facility], [place], [place] [address]  Antifungal AST 9 Drug Panel     Status:  None   Collection Time: 01/04/21  2:58 PM  Result Value Ref Range Status   Organism ID, Yeast Candida krusei  Corrected    Comment: (NOTE) Identification performed by account, not confirmed by this laboratory. CORRECTED ON 10/18 AT 0537: PREVIOUSLY REPORTED AS Preliminary report    Amphotericin B MIC 2.0 ug/mL  Final    Comment: (NOTE) Breakpoints have been established for only some organism-drug combinations as indicated. This test was developed and its performance characteristics determined by [facility]. It has not been cleared or approved by the Food and Drug Administration.    Anidulafungin MIC Comment  Final    Comment: (NOTE) 0.12 ug/mL Susceptible Breakpoints have been  established for only some organism-drug combinations as indicated. This test was developed and its performance characteristics determined by [facility]. It has not been cleared or approved by the Food and Drug Administration.    Caspofungin MIC Comment  Final    Comment: (NOTE) 0.5 ug/mL Intermediate Breakpoints have been established for only some organism-drug combinations as indicated. This test was developed and its performance characteristics determined by [facility]. It has not been cleared or approved by the Food and Drug Administration.    Micafungin MIC Comment  Final    Comment: (NOTE) 0.25 ug/mL Susceptible Breakpoints have been established for only some organism-drug combinations as indicated. This test was developed and its performance characteristics determined by [facility]. It has not been cleared or approved by the Food and Drug Administration.    Posaconazole MIC 0.5 ug/mL  Final    Comment: (NOTE) Breakpoints have been established for only some organism-drug combinations as indicated. This test was developed and its performance characteristics determined by [facility]. It has not been cleared or approved by the Food and Drug Administration.    Fluconazole Islt MIC 64.0 ug/mL  Final     Comment: (NOTE) This organism is inherently resistant to fluconazole. Breakpoints have been established for only some organism-drug combinations as indicated. This test was developed and its performance characteristics determined by [facility]. It has not been cleared or approved by the Food and Drug Administration.    Flucytosine MIC 8.0 ug/mL  Final    Comment: (NOTE) Breakpoints have been established for only some organism-drug combinations as indicated. This test was developed and its performance characteristics determined by [facility]. It has not been cleared or approved by the Food and Drug Administration.    Itraconazole MIC 0.5 ug/mL  Final    Comment: (NOTE) Breakpoints have been established for only some organism-drug combinations as indicated. This test was developed and its performance characteristics determined by [facility]. It has not been cleared or approved by the Food and Drug Administration.    Voriconazole MIC 0.5 ug/mL Susceptible  Final    Comment: (NOTE) Breakpoints have been established for only some organism-drug combinations as indicated. This test was developed and its performance characteristics determined by [facility]. It has not been cleared or approved by the Food and Drug Administration. Performed At: [facility] [place], [place] [id number] Rush Farmer MD FO:[id number]    Source [phone] C.KRUSEI 9 DRUGS FUNGAL PANEL BLOOD CULTURE  Final    Comment: Performed at [facility], [place] [address] [place], [place] [address]  MRSA Next Gen by PCR, Nasal     Status: None   Collection Time: 01/04/21  4:58 PM   Specimen: Nasal Mucosa; Nasal Swab  Result Value Ref Range Status   MRSA by PCR Next Gen NOT DETECTED NOT DETECTED Final    Comment: (NOTE) The GeneXpert MRSA Assay (FDA approved for NASAL specimens only), is one component of a comprehensive MRSA colonization surveillance program. It is not intended to diagnose MRSA  infection nor to guide or monitor treatment for MRSA infections. Test performance is not FDA approved in patients less than 76 years old. Performed at [facility], [address] [place], [place] [address]   Urine Culture     Status: Abnormal   Collection Time: 01/04/21  8:50 PM   Specimen: Urine, Clean Catch  Result Value Ref Range Status   Specimen Description   Final    URINE, CLEAN CATCH Performed at [facility], [address] [place], [place] [address]    Special Requests   Final    NONE Performed at [facility], [address] [place], [place] [address]    Culture (A)  Final    <10,000 COLONIES/mL INSIGNIFICANT GROWTH Performed at [facility] [address] [place], [place] [address]    Report Status 01/07/2021 FINAL  Final  Gastrointestinal Panel by PCR , Stool     Status: None   Collection Time: 01/05/21  3:01 PM   Specimen: Stool  Result Value Ref Range Status   Campylobacter species NOT DETECTED NOT DETECTED Final   Plesimonas shigelloides NOT DETECTED NOT DETECTED Final   Salmonella species NOT DETECTED NOT DETECTED Final   Yersinia enterocolitica NOT DETECTED NOT DETECTED Final   Vibrio species NOT DETECTED NOT DETECTED Final   Vibrio cholerae NOT DETECTED NOT DETECTED Final   Enteroaggregative E coli (EAEC) NOT DETECTED NOT DETECTED Final   Enteropathogenic E coli (EPEC) NOT DETECTED NOT DETECTED Final   Enterotoxigenic E coli (ETEC) NOT DETECTED NOT DETECTED Final   Shiga like toxin producing E coli (STEC) NOT DETECTED NOT DETECTED Final   Shigella/Enteroinvasive E coli (EIEC) NOT DETECTED NOT DETECTED Final   Cryptosporidium NOT DETECTED NOT DETECTED Final   Cyclospora cayetanensis NOT DETECTED NOT DETECTED Final   Entamoeba histolytica NOT DETECTED NOT DETECTED Final   Giardia lamblia NOT DETECTED NOT DETECTED Final   Adenovirus F40/41 NOT DETECTED NOT DETECTED Final   Astrovirus NOT DETECTED NOT DETECTED Final    Norovirus GI/GII NOT DETECTED NOT DETECTED Final   Rotavirus A NOT DETECTED NOT DETECTED Final   Sapovirus (I, II, IV, and V) NOT DETECTED NOT DETECTED Final    Comment: Performed at [facility], [address] [place], [place] [address]  C Difficile Quick Screen w PCR reflex     Status: Abnormal   Collection Time: 01/05/21  3:01 PM   Specimen: STOOL  Result Value Ref Range Status   C Diff antigen POSITIVE (A) NEGATIVE Final   C Diff toxin NEGATIVE NEGATIVE Final   C Diff interpretation Results are indeterminate. See PCR results.  Final    Comment: Performed at [facility], [address] [place], [place] [address]  C. Diff by PCR, Reflexed     Status: None   Collection Time: 01/05/21  3:01 PM  Result Value Ref Range Status   Toxigenic C. Difficile by PCR NEGATIVE NEGATIVE Final    Comment: Patient is colonized with non toxigenic C. difficile. May not need treatment unless significant symptoms are present. Performed at [facility], [address] [place], [place] [address]   Culture, blood (single)     Status: None   Collection Time: 01/05/21  4:42 PM   Specimen: BLOOD  Result Value Ref Range Status   Specimen Description BLOOD BLOOD LEFT HAND  Final   Special Requests   Final    BOTTLES DRAWN AEROBIC AND ANAEROBIC Blood Culture adequate volume   Culture   Final    NO GROWTH 5 DAYS Performed at [facility], [address], [place], [place] [address]    Report Status 01/10/2021 FINAL  Final  CSF culture w Gram Stain     Status: None   Collection Time: 01/07/21  2:43 PM   Specimen: PATH Cytology CSF; Cerebrospinal Fluid  Result Value Ref Range Status   Specimen Description   Final    CSF Performed at [facility], [address],  [place], [place] [address]    Special Requests   Final    NONE Performed at [facility], [address] [place], [place] [address]    Gram Stain   Final    NO ORGANISMS  SEEN WBC PRESENT, PREDOMINANTLY MONONUCLEAR CRITICAL RESULT CALLED TO, READ BACK BY AND VERIFIED WITH: Delphina Cahill, RN AT [phone] 01/07/21 BY JRH    Culture   Final    NO GROWTH 3 DAYS Performed at [facility], [place] [address], [place], [place] [address]    Report Status 01/10/2021 FINAL  Final  Fungus Culture With Stain     Status: None (Preliminary result)   Collection Time: 01/07/21  2:43 PM   Specimen: PATH Cytology CSF; Cerebrospinal Fluid  Result Value Ref Range Status   Fungus Stain Final report  Final    Comment: (NOTE) Performed At: [facility] [place], [place] [id number] Rush Farmer MD [id number]    Fungus (Mycology) Culture PENDING  Incomplete   Fungal Source CSF  Final    Comment: Performed at [facility], [address] [place], [place] [address]  Fungus Culture Result     Status: None   Collection Time: 01/07/21  2:43 PM  Result Value Ref Range Status   Result 1 Comment  Final    Comment: (NOTE) KOH/Calcofluor preparation:  no fungus observed. Performed At: [facility] [place], [place] [id number] Rush Farmer MD [id number]     Studies/Results: DG Skull 1-3 Views  Result Date: 01/12/2021 CLINICAL DATA:  Rule out foreign body for MRI. EXAM: SKULL - 1-3 VIEW COMPARISON:  01/06/2021 FINDINGS: A right frontal approach ventriculostomy catheter remains in place with tip terminating near the midline. The included portion of the shunt tubing in the head and neck appears grossly intact. The shunt is set at 160 mm H2O which represents a change from the prior study. No other radiopaque foreign body is identified. IMPRESSION: Shunt set at 160 mm H2O., higher than on 01/06/2021 Electronically Signed   By: Logan Bores M.D.   On: 01/12/2021 13:58   MR BRAIN WO CONTRAST  Result Date: 01/12/2021 CLINICAL DATA:  Anoxic brain damage. EXAM: MRI HEAD WITHOUT CONTRAST TECHNIQUE: Multiplanar, multiecho  pulse sequences of the brain and surrounding structures were obtained without intravenous contrast. COMPARISON:  MRI of the brain January 06, 2021. FINDINGS: Brain: New cortical foci of restricted diffusion in the right frontoparietal convexity, consistent with recent infarcts. Persistent mild hyperintensity within the right temporoparietal region without corresponding low signal on ADC suggesting subacute infarct. 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 Interval decrease in size of the right cerebral convexity subdural fluid collection 6 mm (8 mm on prior) with minimal mass effect on the adjacent brain parenchyma. Right frontal approach ventricular drain with the tip at the body of the right lateral ventricle. There is no hydrocephalus. Again seen better in of a right parietal gyrus at the level of a burr hole. Low lying cerebellar tonsils extending to the C2 level. Vascular: Absent flow void of the left vertebral artery and proximal intracranial right ICA, corresponding to occlusion best described or CT angiogram performed on January 06, 2021. Skull and upper cervical spine: Postsurgical changes from prior suboccipital craniotomy and cervical laminectomy Sinuses/Orbits: Fluid level within the right maxillary sinus and bilateral sphenoid sinuses. The orbits are maintained. Other: Bilateral mastoid effusion. IMPRESSION: 1. 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 2. Interval decrease in size  of the right subdural hygroma now measuring 6 mm (8 mm on prior). No midline shift. 3. Inflammatory paranasal sinus and mastoid disease, new since prior MRI. Electronically Signed   By: Pedro Earls M.D.   On: 01/12/2021 13:22    Assessment/Plan: HALANA DEISHER is a 45 y.o. 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 Admitted with falls, weakness and found to have profound  anemia. Had large ulcer from NSAIDS noted on EGD. Initially WBC 30 but no fevers. BCX neg initially but now growing Candida krusei. She seems to be back to her baseline mentally. She has CT with possible subacute infarct and possible small SDH.  She denies any recent shunt issues. Unclear if she is neurologically at her baseline.    The candidemia source is most likely the gastric ulcer and translocation from the gut.  I am not convinced the shunt is infected as no peritonitis, no obvious change in her mental status. LP shows 20 wbc and elevated protein reviewed with neurosurgery and still not really indicative of infection so will monitor. TEE neg for endocarditis  October 17 had recurrent bleeding and is back in  ICU.  White blood count had increased to 38.5 but is now down to 23.  She was restarted on Unasyn.  CSF cultures remain negative  Oct 18, extubated, febrile. MRI showed multiple small infarcts  Oct 19- had one fever last night 100.6 Wbc down to 19.  Recommendations Cont eraxis x 14 days Stop date 10/25/  Sensis shows S to voriconazole so can change to that at dc if stable. .  If repeat fevers repeat bcx, check UA and CXR.   Cont  unasyn for Aspiration PNA. Cont for 10 day course.  Can change to oral Augmentin to complete course.  Thank you very much for the consult.  ID will sign off but please call with issues.  Leonel Ramsay   01/13/2021, 2:55 PM

## 2021-01-13 NOTE — Progress Notes (Signed)
Pt worked w/ PT and OT today.   Foley catheter and a-line removed today per order.  Bath complete.   Pt Ox3-4 throughout day.   Turn q2. Pt in no acute distress @ this time. Will continue to monitor.

## 2021-01-13 NOTE — Progress Notes (Signed)
[facility] SURGICAL PROGRESS NOTE (cpt [phone])  Hospital Day(s): 9.   Post op day(s): 3 Days Post-Op.   Interval History:  Patient seen and examined Patient extubated yesterday (10/18) morning She is off vasopressor support She is able to tell me she is without abdominal pain, nausea No new labs this morning at this time No further evidence of bleeding   Review of Systems:  Unable to reliably preform secondary to intubation  Vital signs in last 24 hours: [min-max] current  Temp:  [98 F (36.7 C)-101.5 F (38.6 C)] 99 F (37.2 C) (10/19 0000) Pulse Rate:  [95-132] 95 (10/19 0700) Resp:  [9-29] 19 (10/19 0700) BP: (108-154)/(65-104) 133/74 (10/19 0700) SpO2:  [87 %-100 %] 96 % (10/19 0700) Arterial Line BP: (93-235)/(54-232) 131/75 (10/19 0700) FiO2 (%):  [35 %-55 %] 40 % (10/19 0241)     Height: 5' (152.4 cm) Weight: 54.3 kg BMI (Calculated): 23.38   Intake/Output last 2 shifts:  10/18 0701 - 10/19 0700 In: 1284.9 [I.V.:854.9; IV Piggyback:430] Out: 2500 [Urine:2500]   Physical Exam:  Constitutional: Alert, interactive, NAD HENT: normocephalic without obvious abnormality Respiratory: On [place], no respiratory distress Cardiovascular: tachycardic and sinus rhythm  Gastrointestinal: Soft, she does not appear tender, non-distended, no respiratory distress   Labs:  CBC Latest Ref Rng & Units 01/12/2021 01/11/2021 01/11/2021  WBC 4.0 - 10.5 K/uL 25.9(H) - 23.8(H)  Hemoglobin 12.0 - 15.0 g/dL 32.4 40.1 02.7  Hematocrit 36.0 - 46.0 % 41.4 41.9 40.9  Platelets 150 - 400 K/uL 133(L) - 103(L)   CMP Latest Ref Rng & Units 01/12/2021 01/11/2021 01/11/2021  Glucose 70 - 99 mg/dL 253(G) - 644(I)  BUN 6 - 20 mg/dL 13 - 12  Creatinine 3.47 - 1.00 mg/dL 4.25 - 9.56  Sodium 387 - 145 mmol/L 135 - 139  Potassium 3.5 - 5.1 mmol/L 4.2 4.3 2.5(LL)  Chloride 98 - 111 mmol/L 101 - 102  CO2 22 - 32 mmol/L 27 - 29  Calcium 8.9 - 10.3 mg/dL 7.3(L) 6.9(L) 6.3(LL)  Total  Protein 6.5 - 8.1 g/dL - - -  Total Bilirubin 0.3 - 1.2 mg/dL - - -  Alkaline Phos 38 - 126 U/L - - -  AST 15 - 41 U/L - - -  ALT 0 - 44 U/L - - -     Imaging studies: No new pertinent imaging studies   Assessment/Plan: (ICD-10's: R57.8) 45 y.o. female with, now stable, anemia secondary to massive GI hemorrhage from gastric ulcer, complicated by cardiac arrest   - Would recommend NPO for 3-5 days prior to initiation of diet; okay for a few ice chips for comfort, may also benefit from SLP evaluation   - No plans for surgical intervention at this time. Patient remains critically ill but thankfully has remained without signs of bleeding and Hgb remained stable s/p transfusion. If she has any signs of recurrence, may need to proceed emergently  - Monitor abdominal examination  - Pain control prn; antiemetics prn  - Appreciate GI assistance; continue IV PPI  - Monitor H&H closely; transfuse as needed  - Further management per primary service; we will follow closely  All of the above findings and recommendations were discussed with the medical team.  -- Lynden Oxford, PA-C [facility] 01/13/2021, 7:55 AM [phone] M-F: 7am - 4pm

## 2021-01-13 NOTE — Evaluation (Signed)
Physical Therapy ReEvaluation Patient Details Name: Carolyn Herring MRN: [id number] DOB: May 13, 1975 Today's Date: 01/13/2021  History of Present Illness  45 y.o. F arriving 10/10 to ED after a fall with decreased responsiveness and hx of progressive recent abdominal pain and diarrhea.  Was found to have severe GI bleed.  Imaging also showing acute infarct.  Pt coded and required intubation January 14, 2023. 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  Clinical Impression  Pt willing to participate with PT but was functionally quite limited.  She was able to participate with light supine exercises but did not wish to try mobility/sitting as she did some sitting earlier with OT.  Pt with baseline tone and weakness t/o, as well as delayed speech - per brother appears that both these issues are now more limited.  Pt continues to require PT her and at rehab once medically stable.     Recommendations for follow up therapy are one component of a multi-disciplinary discharge planning process, led by the attending physician.  Recommendations may be updated based on patient status, additional functional criteria and insurance authorization.  Follow Up Recommendations SNF;Supervision for mobility/OOB    Equipment Recommendations  Other (comment) (TBD at rehab)    Recommendations for Other Services       Precautions / Restrictions Precautions Precautions: Fall Restrictions Weight Bearing Restrictions: No      Mobility  Bed Mobility Overal bed mobility: Needs Assistance Bed Mobility: Supine to Sit;Sit to Supine     Supine to sit: HOB elevated;+2 for physical assistance;Max assist Sit to supine: +2 for physical assistance;HOB elevated;Max assist   General bed mobility comments: Pt reports she sat up earlier today (with OT) and does not feel she has the energy to try getting up again, defers     Transfers                 General transfer comment: Unsafe to attempt  Ambulation/Gait                Stairs            Wheelchair Mobility    Modified Rankin (Stroke Patients Only)       Balance Overall balance assessment: Needs assistance Sitting-balance support: Feet unsupported Sitting balance-Leahy Scale: Poor                                       Pertinent Vitals/Pain Pain Assessment: Faces Faces Pain Scale: Hurts little more Pain Location: b/l hip movement Pain Descriptors / Indicators: Crying;Moaning;Discomfort Pain Intervention(s): Limited activity within patient's tolerance;Repositioned    Home Living Family/patient expects to be discharged to:: Skilled nursing facility Living Arrangements: Other relatives (ptn living with aunt 6 months prior to admittance) Available Help at Discharge: Family;Available PRN/intermittently   Home Access: Stairs to enter Entrance Stairs-Rails:  (yes)     Home Equipment: None      Prior Function Level of Independence: Needs assistance   Gait / Transfers Assistance Needed: Per pt report, she ambulates without use of AD but does report several falls  ADL's / Homemaking Assistance Needed: Pt does not drive and goes to grocery store with aunt. She has non emergency medical transport for appointments. reports bathing in tub without assistance.        Hand Dominance  Extremity/Trunk Assessment   Upper Extremity Assessment Upper Extremity Assessment: Defer to OT evaluation RUE Deficits / Details: Grip 3/5, Grossly 2+/5 RUE. Acheives 5 finger opposition w/ inc time and VCs. RUE Sensation: decreased light touch LUE Deficits / Details: Grip 3/5, Grossly 2/5 LUE. Acheives thumb opposition 2nd/3rd digit. LUE Sensation: decreased light touch    Lower Extremity Assessment Lower Extremity Assessment:  (some tone with b/l ankle DF/PF, grossly 2+ to 3-/5 t/o b/l LEs apart from 3+/5 hip  ab/ad on R)       Communication   Communication: Expressive difficulties  Cognition Arousal/Alertness: Lethargic Behavior During Therapy: WFL for tasks assessed/performed Overall Cognitive Status: Impaired/Different from baseline Area of Impairment: Orientation;Memory;Following commands;Safety/judgement;Problem solving                 Orientation Level:  (stated full name and birthday, extra time needed to relay info)   Memory: Decreased short-term memory Following Commands: Follows one step commands consistently Safety/Judgement: Decreased awareness of deficits   Problem Solving: Slow processing;Decreased initiation;Requires verbal cues;Requires tactile cues General Comments: brother present; he reports that pt had delayed/halting speech prior to coming in, but much slower/delayed/effortful now      General Comments General comments (skin integrity, edema, etc.): Pt with good effort but low energy t/o session; she has not eaten in ~1 week and likely eaten only minimally for much longer.    Exercises General Exercises - Lower Extremity Ankle Circles/Pumps: AROM;10 reps;Both;Supine Heel Slides: AAROM;10 reps;Both (lightly resisted leg ext) Hip ABduction/ADduction: AROM;AAROM;10 reps;Both (AROM on R, AAROM on L) Other Exercises Other Exercises: Pt educ re: Importance of mvmt to maintain functional mobility Other Exercises: Sup<>sit, eating ice chips, mmt, vision screen, sitting EOB ~ 10 min while brushing hair, self-feeding ice chips.   Assessment/Plan    PT Assessment Patient needs continued PT services  PT Problem List Decreased strength;Decreased range of motion;Decreased activity tolerance;Decreased balance;Decreased mobility;Decreased coordination;Decreased cognition;Decreased knowledge of use of DME;Decreased safety awareness;Pain       PT Treatment Interventions Balance training;Gait training;Neuromuscular re-education;Stair training;Functional mobility  training;Therapeutic activities;Therapeutic exercise;Patient/family education;DME instruction    PT Goals (Current goals can be found in the Care Plan section)  Acute Rehab PT Goals Patient Stated Goal: get stronger back to walking PT Goal Formulation: With patient/family Time For Goal Achievement: 02/03/21 Potential to Achieve Goals: Fair    Frequency 7X/week   Barriers to discharge        Co-evaluation               AM-PAC PT "6 Clicks" Mobility  Outcome Measure Help needed turning from your back to your side while in a flat bed without using bedrails?: A Lot Help needed moving from lying on your back to sitting on the side of a flat bed without using bedrails?: A Lot Help needed moving to and from a bed to a chair (including a wheelchair)?: Total Help needed standing up from a chair using your arms (e.g., wheelchair or bedside chair)?: Total Help needed to walk in hospital room?: Total Help needed climbing 3-5 steps with a railing? : Total 6 Click Score: 8    End of Session   Activity Tolerance: Patient limited by fatigue;Patient limited by lethargy Patient left: in bed;with call bell/phone within reach;with bed alarm set;with family/visitor present Nurse Communication: Mobility status PT Visit Diagnosis: Other abnormalities of gait and mobility (R26.89);Repeated falls (R29.6);History of falling (Z91.81);Muscle weakness (generalized) (M62.81)    Time: 5643-3295 PT Time Calculation (min) (ACUTE ONLY): 31 min  Charges:   PT Evaluation $PT Re-evaluation: 1 Re-eval PT Treatments $Therapeutic Exercise: 8-22 mins        Malachi Pro, DPT 01/13/2021, 5:56 PM

## 2021-01-13 NOTE — Progress Notes (Addendum)
NAME:  Carolyn Herring, MRN:  [id number], DOB:  1975/06/25, LOS: 9 ADMISSION DATE:  01/04/2021 BRIEF SYNOPSIS 45 yo female with a PMH of HTN, Hydrocephalus, and 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 last revised in 2019.  She has not seen a neurosurgeon in 3 yrs.  She presented to [facility] ER on 10/10 via EMS from home with recurrent falls, generalized weakness, and back pain.  She is unsure if she has been hitting her head during the falls.  She also endorsed several week hx of abdominal pain and diarrhea.  She was diagnosed with COVID-19 in 08/2020, and has had worsening generalized weakness since the dx.  She has not seen a PCP for evaluation of symptoms.  She does live alone, but her aunt lives next door.  On 10/10 pt able to notify EMS for assistance, however when they arrived at her home she was unable to answer the door.  Therefore,  EMS had to break her door down to gain entry.  Hospital course Was admitted and subsequently resuscitated with blood transfusions and IV fluids and was able to transfer to the regular medical floor.  Her encephalopathy cleared.  +FUNGEMIA She underwent upper endoscopy on 05 January 2021 that revealed a very large antral ulcer with adherent clot.  Clot was not removed.  The ulcer description was that of a giant ulcer.  It was believed the etiology was from the patient grinding NSAIDs to ingest them for control of pain.  She apparently stabilized after this initial EGD procedure and no further drop in H&H was noted.  She then underwent a TEE with anesthesia monitoring and did not have difficulties through that.  However yesterday it appeared that she had melenic stools and her H&H did have a significant drop.  GI was then reconsulted and EGD was planned for today.  The patient again was noted to have a large gastric ulcer it was noted to be bleeding actively and various methodologies were utilized to attempt control however the patient  developed PEA arrest and respiratory distress after aspiration of blood.  The patient underwent massive transfusion protocol.  CODE BLUE situation ensued the patient was resuscitated.  She was transferred to the intensive care unit intubated and mechanically ventilated.  Patient received 4 units of blood during resuscitation.  She has also required fresh frozen plasma and platelets transfusion.  Currently requiring pressors.  She is profoundly acidotic.  Intubated and unresponsive on the vent.  PCCM consulted to assume care while patient is in ICU.  Significant Hospital Events: Including procedures, antibiotic start and stop dates in addition to other pertinent events   10/10: Pt admitted to ICU with acute encephalopathy concerning for acute CVA, hemorrhagic shock, possible sepsis, and acute hypoxic respiratory failure secondary to possible aspiration pneumonia  10/11: Echo revealed left ventricular ejection fraction, by estimation, is 55 to 60%. The  left ventricle has normal function. The left ventricle has no regional  wall motion abnormalities. Left ventricular diastolic parameters are consistent with Grade I diastolic  dysfunction (impaired relaxation). Elevated left atrial pressure.  Right ventricular systolic function is normal. The right ventricular  size is normal. The mitral valve is abnormal. Cannot exclude prolapse of posterior  leaflet or cleft. There is at least mild to moderate mitral valve regurgitation. No evidence of mitral stenosis.  10/10: CT Head revealed new hypodense right subdural collection measuring 0.9 cm, without evidence of acute blood products. Underlying loss of gray-white matter  differentiation in the right parietal lobe, concerning for acute-subacute infarct. Recommend MRI. 10/10: CT Cervical Spine revealed no acute cervical spine fracture. Unchanged chronic congenital and postoperative changes. 10/10: CT Chest/Abd/Pelvis revealed No evidence of acute trauma in the chest.  Focal peripheral hypodensity in the upper posterior aspect of the spleen, morphology favoring small cyst, hemangioma, or possibly splenic infarct, and felt unlikely to be acute trauma. No other acute findings in the abdomen or pelvis.  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 Non opacification of the proximal left vertebral artery from its origin, with thread-like enhancement in the mid V2 segment, extending to the V3 and V4 segments. Diminutive right MCA and ACA, which appear patent. No additional intracranial large vessel occlusion. Extensive ground-glass opacities in the imaged lungs, which are new compared to 01/04/2021, concerning for infection. Consider repeat chest CT. Redemonstrated tethering of the right parietal cortex to a right parietal burr hole, with cortical hypodensity likely representing infarcts of varying ages, better evaluated on same-day MRI. 10/14: Echo TEE revealed no valve vegetation noted/no endocarditis 10/16: readmit to ICU with hemorrhagic shock S/P PEA arrest during endoscopy 10/17: remains on vent 10/18: Pt successfully extubated 50% 45L HHFNC  10/18: DG Skull 1-3 Views revealed Shunt set at 160 mm H2O., higher than on 01/06/2021 10/18: MR Brain revealed 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 No midline shift. Inflammatory paranasal sinus and mastoid disease, new since prior MRI.  Micro Data:  Respiratory Panel by RT-PCR 10/10>>negative  Blood 10/10>>candida krusei anaerobic bottle only Urine 10/10>><10,000 colonies/ml insignificant growth  GI Panel 10/11>>negative  C diff 10/11>>positive antigen/negative toxin/pcr negative CSF 10/13>>negative  Fungal culture 10/13>>  Antimicrobials:  Zosyn 10/10 x1 dose Unasyn  10/10>> Anidulafungin  10/12>>  Interim History / Subjective:  Pt stating "I want ice" needs frequent redirection to answer questions appropriately. Pt remains on HHFNC at 40% with no c/o shortness of breath will attempt to wean  Objective   Blood pressure 133/74, pulse 95, temperature 99 F (37.2 C), temperature source Axillary, resp. rate 19, height 5' (1.524 m), weight 54.3 kg, SpO2 96 %.    Vent Mode: PSV;CPAP FiO2 (%):  [35 %-55 %] 40 % PEEP:  [5 cmH20] 5 cmH20 Pressure Support:  [5 cmH20-12 cmH20] 5 cmH20   Intake/Output Summary (Last 24 hours) at 01/13/2021 0715 Last data filed at 01/13/2021 0865 Gross per 24 hour  Intake 1284.87 ml  Output 2500 ml  Net -1215.13 ml   Filed Weights   01/08/21 0703 01/09/21 0500 01/10/21 0500  Weight: 50.9 kg 54.2 kg 54.3 kg    REVIEW OF SYSTEMS: Positives in BOLD  Gen: Denies fever, chills, weight change, fatigue, night sweats HEENT: Denies blurred vision, double vision, hearing loss, tinnitus, sinus congestion, rhinorrhea, sore throat, neck stiffness, dysphagia PULM: Denies shortness of breath, cough, sputum production, hemoptysis, wheezing CV: Denies chest pain, edema, orthopnea, paroxysmal nocturnal dyspnea, palpitations GI: Denies abdominal pain, nausea, vomiting, diarrhea, hematochezia, melena, constipation, change in bowel habits GU: Denies dysuria, hematuria, polyuria, oliguria, urethral discharge Endocrine: Denies hot or cold intolerance, polyuria, polyphagia or appetite change Derm: Denies rash, dry skin, scaling or peeling skin change Heme: Denies easy bruising, bleeding, bleeding gums Neuro: Denies headache, numbness, weakness, slurred speech, loss of memory or consciousness  Examination: General: chronically  ill appearing female, NAD resting in bed  HENT: supple, no JVD  Lungs: expiratory and inspiratory wheezes with rhonchi throughout, even, non labored  Cardiovascular: sinus tachycardia, s1s2, no R/G, 2+ radial/1+ distal   Abdomen: +BS x4, soft, non tender, non distended  Extremities: left hand contracted, moves all extremities; right forearm kerlix wrap in place clean/dry/intact Neuro: alert and disoriented to time, follows commands, pt with anomic aphasia, pt needs frequent redirection to focus during neuro assessment, PERRL, disconjugate gaze GU: indwelling foley catheter in place draining yellow urine   Labs/imaging that I havepersonally reviewed  (right click and "Reselect all SmartList Selections" daily)  All labs reviewed 01/13/2021  Assessment & Plan:  Acute respiratory failure with hypoxia and hypercarbia due to PEA arrest Aspiration pneumonia (blood aspirated) - Prn supplemental O2 sats for dyspnea and/or hypoxia  - Titrate O2 to keep saturations at 92% or better - Bronchodilators as needed   S/P PEA arrest D/T 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 - Continuous telemetry monitoring  - Aggressive iv fluid resuscitation to maintain map >65  - Pressors as needed  - Serial H&H - Transfusion protocol in place - Monitor for s/sx of bleeding - Transfuse for hgb <7 - Continue IV protonix, infusion - Avoid all NSAID's  - General Surgery and GI consulted appreciate input    Hypokalemia~resolved  - Trend BMP  - Replace electrolytes as indicated  - Monitor UOP   Leukocytosis secondary to aspiration pneumonia  Blood cultures 10/10: candida krusei anaerobic bottle only - Trend WBC and monitor fever curve  - Trend PCT  - Follow cultures  - Continue unasyn and eraxis     Recurrent encephalopathy post arrest~MR Brain 10/18 revealed 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 No midline shift. Inflammatory paranasal sinus and mastoid disease, new since prior MRI. - Supportive care - Avoid sedatives as able - PT/OT consulted appreciate input  - Neurology consulted  appreciate input    Best Practice (right click and "Reselect all SmartList Selections" daily)  Diet/type: NPO; if cleared by GI will consult speech therapy  DVT prophylaxis: SCD's GI prophylaxis: PPI bid  Lines: R Fem CVL, will discontinue arterial line 10/19  Foley:  indwelling foley catheter  Code Status:  full code Last date of multidisciplinary goals of care discussion [01/13/21]  Labs   CBC: Recent Labs  Lab 01/09/21 0438 01/09/21 0802 01/10/21 0506 01/10/21 1307 01/10/21 1337 01/10/21 1705 01/10/21 2222 01/11/21 0512 01/11/21 1956 01/12/21 0530  WBC 22.0*  --  33.7*  --  38.5*  --   --  23.8*  --  25.9*  NEUTROABS  --   --   --   --  26.4*  --   --   --   --   --   HGB 5.9*   < > 9.8* 10.3* 10.5* 14.3 14.5 14.4 14.6 14.2  HCT 19.0*   < > 29.4* 31.3* 31.0* 40.3 39.8 40.9 41.9 41.4  MCV 85.6  --  85.5  --  91.4  --   --  79.1*  --  83.6  PLT 244  --  150 84* 84*  --   --  103*  --  133*   < > = values in this interval not displayed.    Basic Metabolic Panel: Recent Labs  Lab 01/07/21 0455 01/08/21 0514 01/09/21 0438 01/10/21 0506  01/10/21 1315 01/11/21 0512 01/11/21 1956 01/12/21 0530  NA 136   < > 136 136 138 139  --  135  K 4.2   < > 3.6 3.6 4.1 2.5* 4.3 4.2  CL 104   < > 105 108 109 102  --  101  CO2 24   < > 24 21* 16* 29  --  27  GLUCOSE 110*   < > 95 96 341* 121*  --  106*  BUN 15   < > 21* 22* 19 12  --  13  CREATININE 0.62   < > 0.66 0.52 0.89 0.56  --  0.67  CALCIUM 8.1*   < > 7.8* 7.6* 6.6* 6.3* 6.9* 7.3*  MG 2.0   < > 1.9 1.7  --  1.3* 2.9* 2.4  PHOS 2.9  --   --   --   --  4.2  --  3.4   < > = values in this interval not displayed.   GFR: Estimated Creatinine Clearance: 64.5 mL/min (by C-G formula based on SCr of 0.67 mg/dL). Recent Labs  Lab 01/10/21 0506 01/10/21 1337 01/11/21 0512 01/12/21 0530  WBC 33.7* 38.5* 23.8* 25.9*    Liver Function Tests: Recent Labs  Lab 01/12/21 0530  ALBUMIN 1.9*   No results for input(s):  LIPASE, AMYLASE in the last 168 hours.  No results for input(s): AMMONIA in the last 168 hours.  ABG    Component Value Date/Time   PHART 7.46 (H) 01/11/2021 0512   PCO2ART 46 01/11/2021 0512   PO2ART 90 01/11/2021 0512   HCO3 32.7 (H) 01/11/2021 0512   ACIDBASEDEF 7.3 (H) 01/10/2021 1532   O2SAT 97.4 01/11/2021 0512     Coagulation Profile: Recent Labs  Lab 01/10/21 1307  INR 1.9*    Cardiac Enzymes: No results for input(s): CKTOTAL, CKMB, CKMBINDEX, TROPONINI in the last 168 hours.   HbA1C: Hgb A1c MFr Bld  Date/Time Value Ref Range Status  01/05/2021 05:40 AM 5.4 4.8 - 5.6 % Final    Comment:    (NOTE)         Prediabetes: 5.7 - 6.4         Diabetes: >6.4         Glycemic control for adults with diabetes: <7.0     CBG: Recent Labs  Lab 01/10/21 0348 01/10/21 1559 01/11/21 0739 01/11/21 1120 01/11/21 1945  GLUCAP 95 293* 110* 114* 107*    Allergies Allergies  Allergen Reactions   Tape Dermatitis    **BLISTERS** **BLISTERS**    Codeine Itching   Hydrogen Peroxide Other (See Comments)    Skin Irritation   Latex Other (See Comments)    Skin Irritation; blisters       Critical Care Time: 40 minutes   Camie Patience, AGNP  Pulmonary/Critical Care Pager [phone] (please enter 7 digits) PCCM Consult Pager [phone] (please enter 7 digits)

## 2021-01-13 NOTE — Progress Notes (Signed)
  Chaplain On-Call responded to [place] Order for Advance Directives information for the patient.  Chaplain received medical update from Dows, who stated that he assessed the patient to be receptive for conversation.  Chaplain met the patient and discussed the AD documents with her. She stated that she will rely on her brother [name] for additional discussion about the documents.  Chaplain provided spiritual and emotional support, and prayer at the patient's request.  Chaplain Pollyann Samples M.Div., [facility]

## 2021-01-13 NOTE — Progress Notes (Signed)
Occupational Therapy Reevaluation Patient Details Name: Carolyn Herring MRN: [id number] DOB: January 08, 1976 Today's Date: 01/13/2021   History of Present Illness Pt is a 45 y.o. F arriving to ED after a fall with decreased responsiveness and hx of abdominal pain and diarrhea. Upon arrival pt was found to have severe GI bleed. 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   Clinical Impression   Carolyn Herring seen for OT reevaluation following ICU transfer, intubation/extubation, and acute infarct. Upon arrival to room pt lethargic. Pt seated upright in bed with family present. Pt experiencing decrease in cognition in areas of orientation, memory, following commands, safety/ judgement, and problem solving. Pt grossly 2+/5 RUE, 2/5 LUE. Pt requires MAX A  for hair brushing, seated EOB ~10 min. Pt MIN A + single RUE support decreasing w/ fatigue, sitting EOB during functional activity. Pt SETUP + MIN A for eating ice chips using L hand, elbow supported, built up spoon grip. Goals downgraded to reflect current function. Pt continues to benefit from skilled OT services to maximize return to PLOF and minimize risk of future falls, injury, caregiver burden, and readmission. Will continue to follow POC. Discharge recommendation remains appropriate.       Recommendations for follow up therapy are one component of a multi-disciplinary discharge planning process, led by the attending physician.  Recommendations may be updated based on patient status, additional functional criteria and insurance authorization.   Follow Up Recommendations  SNF    Equipment Recommendations  Other (comment) (defer to next venue of care)    Recommendations for Other Services       Precautions / Restrictions Precautions Precautions: Fall Restrictions Weight Bearing Restrictions: No      Mobility Bed Mobility Overal  bed mobility: Needs Assistance Bed Mobility: Supine to Sit;Sit to Supine     Supine to sit: HOB elevated;+2 for physical assistance;Max assist Sit to supine: +2 for physical assistance;HOB elevated;Max assist        Transfers                 General transfer comment: Unsafe to attempt    Balance Overall balance assessment: Needs assistance Sitting-balance support: Feet unsupported Sitting balance-Leahy Scale: Poor                                     ADL either performed or assessed with clinical judgement   ADL Overall ADL's : Needs assistance/impaired                                       General ADL Comments: Pt requires MAX A  for hair brushing, seated EOB~ 10 min. Pt MIN A + single RUE support decreasing w/ fatigue, sitting EOB during functional activity. Pt SETUP + MIN A for eating ice chips using L hand, elbow supported, built up spoon grip.     Vision   Vision Assessment?: Vision impaired- to be further tested in functional context;Yes Tracking/Visual Pursuits: Right eye does not track laterally;Decreased smoothness of horizontal tracking;Decreased smoothness of vertical tracking            Pertinent Vitals/Pain Pain Assessment: Faces Faces Pain Scale: Hurts even more Pain Location: L shoulder, L thigh Pain Descriptors / Indicators: Crying;Moaning;Discomfort Pain Intervention(s):  Limited activity within patient's tolerance;Repositioned     Hand Dominance     Extremity/Trunk Assessment Upper Extremity Assessment Upper Extremity Assessment: RUE deficits/detail;LUE deficits/detail RUE Deficits / Details: Grip 3/5, Grossly 2+/5 RUE. Acheives 5 finger opposition w/ inc time and VCs. RUE Sensation: decreased light touch LUE Deficits / Details: Grip 3/5, Grossly 2/5 LUE. Acheives thumb opposition 2nd/3rd digit. LUE Sensation: decreased light touch   Lower Extremity Assessment Lower Extremity Assessment: Defer to PT  evaluation       Communication Communication Communication: Expressive difficulties;Receptive difficulties (Difficult to assess how much is being understood for receptive language)   Cognition Arousal/Alertness: Lethargic Behavior During Therapy: WFL for tasks assessed/performed Overall Cognitive Status: Impaired/Different from baseline Area of Impairment: Orientation;Memory;Following commands;Safety/judgement;Problem solving                 Orientation Level: Disoriented to;Person;Situation (Knew first name, but not last)   Memory: Decreased short-term memory Following Commands: Follows one step commands consistently Safety/Judgement: Decreased awareness of deficits   Problem Solving: Slow processing;Decreased initiation;Requires verbal cues;Requires tactile cues General Comments: Compared to previous tx, pt less able to express throughts and needs, follow commands, initiate action   General Comments       Exercises Exercises: Other exercises Other Exercises Other Exercises: Pt educ re: Importance of mvmt to maintain functional mobility Other Exercises: Sup<>sit, eating ice chips, mmt, vision screen, sitting EOB ~ 10 min while brushing hair, self-feeding ice chips.          OT Problem List: Decreased strength;Decreased range of motion;Decreased activity tolerance;Impaired balance (sitting and/or standing);Impaired vision/perception;Decreased safety awareness;Impaired UE functional use      OT Treatment/Interventions: Self-care/ADL training;Therapeutic exercise;Neuromuscular education;DME and/or AE instruction;Therapeutic activities    OT Goals(Current goals can be found in the care plan section) Acute Rehab OT Goals Patient Stated Goal: To feel better OT Goal Formulation: With patient Time For Goal Achievement: 01/27/21 Potential to Achieve Goals: Good ADL Goals Pt Will Perform Grooming: with supervision;sitting;with set-up Pt Will Perform Lower Body Dressing:  with mod assist;with caregiver independent in assisting Pt Will Transfer to Toilet: with max assist;with +2 assist;squat pivot transfer;bedside commode Pt Will Perform Toileting - Clothing Manipulation and hygiene: with mod assist;bed level  OT Frequency: Min 2X/week   Barriers to D/C:            Co-evaluation              AM-PAC OT "6 Clicks" Daily Activity     Outcome Measure Help from another person eating meals?: A Lot Help from another person taking care of personal grooming?: A Lot Help from another person toileting, which includes using toliet, bedpan, or urinal?: A Lot Help from another person bathing (including washing, rinsing, drying)?: A Lot Help from another person to put on and taking off regular upper body clothing?: A Lot Help from another person to put on and taking off regular lower body clothing?: A Lot 6 Click Score: 12   End of Session Nurse Communication: Mobility status  Activity Tolerance: Patient limited by fatigue;Patient limited by pain Patient left: in bed;with call bell/phone within reach;with nursing/sitter in room;with family/visitor present  OT Visit Diagnosis: Muscle weakness (generalized) (M62.81);Other symptoms and signs involving the nervous system (R29.898);Hemiplegia and hemiparesis Hemiplegia - Right/Left: Left Hemiplegia - dominant/non-dominant: Non-Dominant Hemiplegia - caused by: Cerebral infarction                Time: 8527-7824 OT Time Calculation (min): 41 min Charges:  OT General Charges $  OT Visit: 1 Visit OT Evaluation $OT Re-eval: 1 Re-eval OT Treatments $Self Care/Home Management : 8-22 mins $Therapeutic Activity: 8-22 mins [place] [facility], Adine Madura 01/13/2021, 4:17 PM

## 2021-01-13 NOTE — Progress Notes (Signed)
Neurology Progress Note  Patient ID: Carolyn Herring is a 45 y.o. with PMHx of  has a past medical history of Hypertension and Spina bifida (HCC).  Initially consulted for: Altered mental status, decreased responsiveness  Initial neurology HPI: Carolyn Herring is a 45 y.o. 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  She had a hemoglobin of two.  She reports that for the past 2 to 3 weeks, she has had intermittent episodes of lightheadedness and when she gets lightheadedness she also gets "spots in her vision" isolated to the right eye.  She was lethargic on arrival and therefore had a head CT which demonstrated a subdural fluid collection as well as a hypodense area underlying this fluid collection.  This was not present on a head CT in July 2021.   Possibly of note, the patient had a fall in early summer, she states that she was on the toilet and slipped off the side and hit her head.  She did not lose consciousness and her family reports that confused for some time afterwards.  They tried to convince her to seek care, but she refused at that time.  That is the only significant head injury in recent years. [Dr. Amada Jupiter 10/10]  She was last seen by neurology on 10/13 at which time her right parietal insult was felt to be either related to her cerebral catheter in the setting of hygroma versus secondary to her chronic carotid occlusion (given mild symptoms despite severe hypotension and anemia).  LDL and A1c were noted to be within goal (45 and 5.4% respectively), and risks of aspirin was felt to outweigh benefit given mechanism of hypoperfusion and recent significant GI bleed.  Additionally neurosurgery was noted to have adjusted her shunt to reduce over shunting and ID was following for fungemia (Candida Creacy felt to be secondary to gastric ulcer translocation).  While her TEE on 10/14  was negative for valve vegetations and endocarditis, she was unfortunately admitted to the ICU with has been hemorrhagic shock and PEA arrest with complication of endoscopy (which did reveal a large ulcer felt to be secondary to NSAID use) on 10/16 with extubation on 10/18.  Repeat MRI brain on 10/18 for encephalopathy revealed multiple acute infarcts in the right MCA watershed distribution again for which neurology was reconsulted  Subjective: Denies acute complaints  Exam: Vitals:   01/13/21 0800 01/13/21 0900  BP: (!) 142/84 129/75  Pulse: (!) 106 (!) 101  Resp: 20 (!) 22  Temp:    SpO2: 97% 93%   Gen: In bed, comfortable  Resp: mildly short of breath at rest, slight cough Cardiac: Perfusing extremities well  MSK: some chronic appearing left sided weakness based on limbs appearing contracted, winces with pain stating she just had needle removed in the LUE Abd: soft, nt  Neuro: MS: Alert, slow to respond and sometimes simply does not respond; poor attention/concentration. Oriented to month, not year, states she is 14 initially then 42, does not name pinkie but otherwise can name/repeat. Oriented to [place], follows some simple commands CN: Alternating exotropia, but tracks examiner in all directions, orients to stimuli in all visual fields, PERRL, mild RNLF, tongue midline Motor/coordination: Upper extremities 2/5 to confrontational testing but antigravity for finger to nose Sensory: No neglect to DSS, reports equal light touch in all 4 extremities    Pertinent Data:   Basic Metabolic Panel: Recent Labs  Lab 01/07/21 0455 01/08/21 0514 01/10/21 0506 01/10/21 1315 01/11/21 0512 01/11/21 1956 01/12/21 0530 01/13/21 0950  NA 136   < > 136 138 139  --  135 139  K 4.2   < > 3.6 4.1 2.5* 4.3 4.2 3.8  CL 104   < > 108 109 102  --  101 107  CO2 24   < > 21* 16* 29  --  27 21*  GLUCOSE 110*   < > 96 341* 121*  --  106* 61*  BUN 15   < > 22* 19 12  --  13 14  CREATININE 0.62    < > 0.52 0.89 0.56  --  0.67 0.55  CALCIUM 8.1*   < > 7.6* 6.6* 6.3* 6.9* 7.3* 8.2*  MG 2.0   < > 1.7  --  1.3* 2.9* 2.4 2.0  PHOS 2.9  --   --   --  4.2  --  3.4 3.4   < > = values in this interval not displayed.    CBC: Recent Labs  Lab 01/10/21 0506 01/10/21 1307 01/10/21 1337 01/10/21 1705 01/10/21 2222 01/11/21 0512 01/11/21 1956 01/12/21 0530 01/13/21 0950  WBC 33.7*  --  38.5*  --   --  23.8*  --  25.9* 19.1*  NEUTROABS  --   --  26.4*  --   --   --   --   --  17.1*  HGB 9.8* 10.3* 10.5*   < > 14.5 14.4 14.6 14.2 13.5  HCT 29.4* 31.3* 31.0*   < > 39.8 40.9 41.9 41.4 41.8  MCV 85.5  --  91.4  --   --  79.1*  --  83.6 86.2  PLT 150 84* 84*  --   --  103*  --  133* 160   < > = values in this interval not displayed.    Coagulation Studies: Recent Labs    01/10/21 1307  LABPROT 22.1*  INR 1.9*      CSF 10/13: Results for Carolyn Herring, Carolyn Herring (MRN [id number]) as of 01/13/2021 09:28  Ref. Range 01/07/2021 14:43  Appearance, CSF Latest Ref Range: CLEAR  CLEAR (A)  Glucose, CSF Latest Ref Range: 40 - 70 mg/dL 66  RBC Count, CSF Latest Ref Range: 0 - 3 /cu mm 0  WBC, CSF Latest Ref Range: 0 - 5 /cu mm 21 (HH)  Segmented Neutrophils-CSF Latest Units: % 20  Lymphs, CSF Latest Units: % 20  Monocyte-Macrophage-Spinal Fluid Latest Units: % 60  Eosinophils, CSF Latest Units: % 0  Color, CSF Latest Ref Range: COLORLESS  COLORLESS  Supernatant Unknown CLEAR  Total  Protein, CSF Latest Ref Range: 15 - 45 mg/dL 98 (H)  Tube # Unknown 3    MRI angio head and neck and MRI brain 01/06/2021 personally reviewed, agree with radiology: 1. Tethering of a segment of the right parietal cortex to a right parietal burr hole. 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 Small focus of restricted diffusion within the posterior right temporal lobe  may reflect a small more acute infarct. 2. Dural thickening, suggestive of intracranial hypotension, possibly due to over shunting. This could also explain the apparent right parietal convexity extra-axial collection, which would accentuate and exacerbate the tethering. 3. Non opacification of the majority of the extra and intracranial right ICA, with reconstitution at the supraclinoid segment,  likely via collaterals. Poor flow in the right ACA and MCA, with possible fusiform dilatation of the right A2 segment. Consider CTA for better evaluation. 4. Non opacification of the left vertebral artery.  CTA 01/06/2021 1. 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 2. Non opacification of the proximal left vertebral artery from its origin, with thread-like enhancement in the mid V2 segment, extending to the V3 and V4 segments.  3. Diminutive right MCA and ACA, which appear patent. No additional intracranial large vessel occlusion. 4. Extensive ground-glass opacities in the imaged lungs, which are new compared to 01/04/2021, concerning for infection. Consider repeat chest CT.  5. Redemonstrated tethering of the right parietal cortex to a right parietal burr hole, with cortical hypodensity likely representing infarcts of varying ages, better evaluated on same-day MRI.  MRI brain 01/12/2021 personally reviewed, agree with radiology: 1. 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 2. 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 No midline shift.  3. Inflammatory paranasal sinus and mastoid disease, new since prior MRI.  Lab Results  Component Value Date   VITAMINB12 269 01/05/2021   01/08/2021 TEE  1. No valve vegetation noted/no endocarditis   2. Left ventricular ejection fraction, by estimation, is 60 to 65%. The  left ventricle has  normal function. The left ventricle has no regional  wall motion abnormalities.   3. Right ventricular systolic function is normal. The right ventricular  size is normal.   4. The mitral valve is normal in structure. Mild to moderate mitral valve  regurgitation. No evidence of mitral stenosis. Possible mild prolapse of  the posterior leaflet, P2 scallop   5. The aortic valve is normal in structure. Aortic valve regurgitation is  not visualized. No aortic stenosis is present.   6. The inferior vena cava is normal in size with greater than 50%  respiratory variability, suggesting right atrial pressure of 3 mmHg.   7. Agitated saline contrast bubble study was negative, with no evidence  of any interatrial shunt.   8. No left atrial/left atrial appendage thrombus was detected.   Assessment: Acute watershed R MCA territory stroke, secondary to recurrent hypoperfusion in the setting of PEA arrest and recurrent anemia w/ chronic R carotid occlusion  Impression: -Acute right MCA territory watershed strokes -Chronic right carotid occlusion -Shunt complicated by right parietal tethering, over shunting -GI ulcer -Hemorrhagic shock -PEA arrest -Candidemia  Recommendations:  # Acute stroke, secondary to recurrent hypoperfusion in the setting of PEA arrest and recurrent anemia w/ chronic R carotid occlusion - Normotension BP goal  - No further workup given full workup already completed this admission as above  - Agree with Dr. Amada Jupiter given GI bleeding and stroke mechanism, would hold aspirin at this time, no indication for statin given LDL   # Delirium - Empiric B12 for goal > 500 (ordered 1000 mcg IM x 1 then 1000 mcg daily) - MMA to confirm B12 functionally low, if normal can stop B12 supplementation, - Thiamine, TSH for reversible causes, supplement if needed - Continue excellent supportive care  - Expect gradual recovery, please contact neurology if she is failing to improve    Appreciate multi-disciplinary management by CCM, ID, Neurosurgery, Surgery, GI  Brooke Dare MD-PhD [facility] [phone]   CRITICAL CARE Performed by: Gordy Councilman   Total critical care time: 40 minutes  Critical care  time was exclusive of separately billable procedures and treating other patients.  Critical care was necessary to treat or prevent imminent or life-threatening deterioration.  Critical care was time spent personally by me on the following activities: development of treatment plan with patient and/or surrogate as well as nursing, discussions with consultants/primary team, evaluation of patient's response to treatment, examination of patient, obtaining history from patient or surrogate, ordering and performing treatments and interventions, ordering and review of laboratory studies, ordering and review of radiographic studies, pulse oximetry and re-evaluation of patient's condition.

## 2021-01-13 NOTE — Progress Notes (Signed)
GI Inpatient Follow-up Note  Subjective:  Patient seen and doing well. Asking for ice chips.  Scheduled Inpatient Medications:   budesonide (PULMICORT) nebulizer solution  0.5 mg Nebulization BID   chlorhexidine gluconate (MEDLINE KIT)  15 mL Mouth Rinse BID   Chlorhexidine Gluconate Cloth  6 each Topical Daily   cyanocobalamin  1,000 mcg Intramuscular Once   Followed by   Derrill Memo ON 01/14/2021] vitamin B-12  1,000 mcg Oral Daily   ipratropium-albuterol  3 mL Nebulization Q6H   mouth rinse  15 mL Mouth Rinse 10 times per day   pantoprazole (PROTONIX) IV  40 mg Intravenous BID   sodium chloride flush  10-40 mL Intracatheter Q12H    Continuous Inpatient Infusions:    sodium chloride 20 mL/hr at 01/10/21 1415   ampicillin-sulbactam (UNASYN) IV 3 g (01/13/21 0741)   anidulafungin 100 mg (01/12/21 1433)    PRN Inpatient Medications:  acetaminophen, fentaNYL (SUBLIMAZE) injection, hydrALAZINE, loperamide, ondansetron (ZOFRAN) IV, oxyCODONE, sodium chloride flush  Review of Systems:  Review of Systems  Constitutional:  Positive for malaise/fatigue. Negative for chills and fever.  Respiratory:  Negative for cough.   Cardiovascular:  Negative for chest pain.  Gastrointestinal:  Negative for abdominal pain, blood in stool, constipation, diarrhea, melena, nausea and vomiting.  Neurological:  Negative for focal weakness.  Psychiatric/Behavioral:  Negative for substance abuse.   All other systems reviewed and are negative.   Physical Examination: BP 137/87   Pulse 94   Temp 99 F (37.2 C) (Axillary)   Resp 20   Ht 5' (1.524 m)   Wt 54.3 kg   SpO2 99%   BMI 23.38 kg/m  Gen: NAD, alert and oriented x 4 HEENT: PEERLA, EOMI, Neck: supple, no JVD or thyromegaly Chest: No respiratory distress CV: RRR, no m/g/c/r Abd: soft, non-tender, non-distended Ext: no edema, well perfused with 2+ pulses, Skin: no rash or lesions noted Lymph: no LAD  Data: Lab Results  Component Value  Date   WBC 19.1 (H) 01/13/2021   HGB 13.5 01/13/2021   HCT 41.8 01/13/2021   MCV 86.2 01/13/2021   PLT 160 01/13/2021   Recent Labs  Lab 01/11/21 1956 01/12/21 0530 01/13/21 0950  HGB 14.6 14.2 13.5   Lab Results  Component Value Date   NA 139 01/13/2021   K 3.8 01/13/2021   CL 107 01/13/2021   CO2 21 (L) 01/13/2021   BUN 14 01/13/2021   CREATININE 0.55 01/13/2021   Lab Results  Component Value Date   ALT 13 01/04/2021   AST 20 01/04/2021   ALKPHOS 82 01/04/2021   BILITOT 0.6 01/04/2021   Recent Labs  Lab 01/10/21 1307  APTT 57*  INR 1.9*   Assessment/Plan: Ms. Stallone is a 45 y.o. lady with history of hypertension and spina bifida s/p VP shunt with candida blood stream infection from large gastric ulcer s/p PEA arrest after EGD over weekend. Hemoglobin stable.  Recommendations:  - IV PPI BID while in hospital - monitor hemoglobins - monitor for overt GI bleeding - appreciate surgical assistance - discussed with surgical team and if cleared from swallow standpoint I think starting at least clears (with some sort of clear protein supplement) would be beneficial given how malnourished she is currently. Can monitor how she tolerates that before deciding to advance - supportive care per primary team - will need repeat EGD in 12 weeks as an outpatient   Will continue to follow. Please call with any questions or concerns.  Raylene Miyamoto  MD, MPH [place]

## 2021-01-14 DIAGNOSIS — D500 Iron deficiency anemia secondary to blood loss (chronic): Secondary | ICD-10-CM | POA: Diagnosis not present

## 2021-01-14 DIAGNOSIS — J9601 Acute respiratory failure with hypoxia: Secondary | ICD-10-CM | POA: Diagnosis not present

## 2021-01-14 DIAGNOSIS — B49 Unspecified mycosis: Secondary | ICD-10-CM | POA: Diagnosis not present

## 2021-01-14 DIAGNOSIS — R578 Other shock: Secondary | ICD-10-CM | POA: Diagnosis not present

## 2021-01-14 DIAGNOSIS — I469 Cardiac arrest, cause unspecified: Secondary | ICD-10-CM | POA: Diagnosis not present

## 2021-01-14 DIAGNOSIS — K254 Chronic or unspecified gastric ulcer with hemorrhage: Secondary | ICD-10-CM | POA: Diagnosis not present

## 2021-01-14 DIAGNOSIS — E872 Acidosis, unspecified: Secondary | ICD-10-CM

## 2021-01-14 DIAGNOSIS — Z515 Encounter for palliative care: Secondary | ICD-10-CM | POA: Diagnosis not present

## 2021-01-14 DIAGNOSIS — Z789 Other specified health status: Secondary | ICD-10-CM | POA: Diagnosis not present

## 2021-01-14 LAB — CBC WITH DIFFERENTIAL/PLATELET
Abs Immature Granulocytes: 0.1 10*3/uL — ABNORMAL HIGH (ref 0.00–0.07)
Basophils Absolute: 0 10*3/uL (ref 0.0–0.1)
Basophils Relative: 0 %
Eosinophils Absolute: 0.4 10*3/uL (ref 0.0–0.5)
Eosinophils Relative: 3 %
HCT: 41 % (ref 36.0–46.0)
Hemoglobin: 13.6 g/dL (ref 12.0–15.0)
Immature Granulocytes: 1 %
Lymphocytes Relative: 8 %
Lymphs Abs: 0.9 10*3/uL (ref 0.7–4.0)
MCH: 28.9 pg (ref 26.0–34.0)
MCHC: 33.2 g/dL (ref 30.0–36.0)
MCV: 87.2 fL (ref 80.0–100.0)
Monocytes Absolute: 0.4 10*3/uL (ref 0.1–1.0)
Monocytes Relative: 3 %
Neutro Abs: 10.3 10*3/uL — ABNORMAL HIGH (ref 1.7–7.7)
Neutrophils Relative %: 85 %
Platelets: 185 10*3/uL (ref 150–400)
RBC: 4.7 MIL/uL (ref 3.87–5.11)
RDW: 17.5 % — ABNORMAL HIGH (ref 11.5–15.5)
WBC: 12.1 10*3/uL — ABNORMAL HIGH (ref 4.0–10.5)
nRBC: 0 % (ref 0.0–0.2)

## 2021-01-14 LAB — BASIC METABOLIC PANEL
Anion gap: 8 (ref 5–15)
BUN: 9 mg/dL (ref 6–20)
CO2: 23 mmol/L (ref 22–32)
Calcium: 7.9 mg/dL — ABNORMAL LOW (ref 8.9–10.3)
Chloride: 107 mmol/L (ref 98–111)
Creatinine, Ser: 0.46 mg/dL (ref 0.44–1.00)
GFR, Estimated: >60 mL/min (ref >60)
Glucose, Bld: 93 mg/dL (ref 70–99)
Potassium: 3.2 mmol/L — ABNORMAL LOW (ref 3.5–5.1)
Sodium: 138 mmol/L (ref 135–145)

## 2021-01-14 LAB — PHOSPHORUS: Phosphorus: 3.5 mg/dL (ref 2.5–4.6)

## 2021-01-14 LAB — GLUCOSE, CAPILLARY
Glucose-Capillary: 87 mg/dL (ref 70–99)
Glucose-Capillary: 144 mg/dL — ABNORMAL HIGH (ref 70–99)
Glucose-Capillary: 135 mg/dL — ABNORMAL HIGH (ref 70–99)
Glucose-Capillary: 147 mg/dL — ABNORMAL HIGH (ref 70–99)

## 2021-01-14 LAB — MAGNESIUM: Magnesium: 1.9 mg/dL (ref 1.7–2.4)

## 2021-01-14 LAB — CALCIUM, IONIZED: Calcium, Ionized, Serum: 5.1 mg/dL (ref 4.5–5.6)

## 2021-01-14 MED ORDER — PANTOPRAZOLE SODIUM 40 MG IV SOLR
40.0000 mg | Freq: Two times a day (BID) | INTRAVENOUS | Status: DC
  Administered 2021-01-14 – 2021-01-15 (×2): 40 mg via INTRAVENOUS
  Filled 2021-01-14 (×2): qty 40

## 2021-01-14 MED ORDER — ADULT MULTIVITAMIN W/MINERALS CH
1.0000 | ORAL_TABLET | Freq: Every day | ORAL | Status: DC
  Administered 2021-01-15 – 2021-01-18 (×4): 1 via ORAL
  Filled 2021-01-14 (×4): qty 1

## 2021-01-14 MED ORDER — ENSURE ENLIVE PO LIQD
237.0000 mL | Freq: Three times a day (TID) | ORAL | Status: DC
  Administered 2021-01-14 – 2021-01-18 (×12): 237 mL via ORAL

## 2021-01-14 MED ORDER — INFLUENZA VAC SPLIT QUAD 0.5 ML IM SUSY
0.5000 mL | PREFILLED_SYRINGE | INTRAMUSCULAR | Status: AC
  Administered 2021-01-15: 0.5 mL via INTRAMUSCULAR
  Filled 2021-01-14: qty 0.5

## 2021-01-14 MED ORDER — POTASSIUM CHLORIDE 20 MEQ PO PACK
40.0000 meq | PACK | Freq: Once | ORAL | Status: AC
  Administered 2021-01-14: 40 meq
  Filled 2021-01-14: qty 2

## 2021-01-14 MED ORDER — ALBUTEROL SULFATE (2.5 MG/3ML) 0.083% IN NEBU: 2.5000 mg | INHALATION_SOLUTION | Freq: Four times a day (QID) | RESPIRATORY_TRACT | Status: DC | PRN

## 2021-01-14 MED ORDER — METOPROLOL TARTRATE 25 MG PO TABS
25.0000 mg | ORAL_TABLET | Freq: Two times a day (BID) | ORAL | Status: DC
Start: 2021-01-14 — End: 2021-01-18
  Administered 2021-01-14 – 2021-01-18 (×9): 25 mg via ORAL
  Filled 2021-01-14 (×9): qty 1

## 2021-01-14 MED ORDER — IPRATROPIUM-ALBUTEROL 0.5-2.5 (3) MG/3ML IN SOLN
3.0000 mL | Freq: Three times a day (TID) | RESPIRATORY_TRACT | Status: DC
  Administered 2021-01-14 – 2021-01-18 (×11): 3 mL via RESPIRATORY_TRACT
  Filled 2021-01-14 (×12): qty 3

## 2021-01-14 MED ORDER — ACETAMINOPHEN 325 MG PO TABS
650.0000 mg | ORAL_TABLET | Freq: Three times a day (TID) | ORAL | Status: DC | PRN
  Administered 2021-01-15: 650 mg via ORAL
  Filled 2021-01-14: qty 2

## 2021-01-14 NOTE — Progress Notes (Signed)
Palliative Care Progress Note, Assessment & Plan   Patient Name: Carolyn Herring       Date: 01/14/2021 DOB: June 15, 1975  Age: 45 y.o. MRN#: [id number] Attending Physician: Fritzi Mandes, MD Primary Care Physician: Patient, No Pcp Per (Inactive) Admit Date: 01/04/2021  Reason for Consultation/Follow-up: Establishing goals of care  HPI: 44 y.o. female  with past medical history of hypertension, hydrocephalus, and spina bifida with a VP shunt with cervical tethered cord release in 2011 (revised in 2019) admitted on 01/04/2021 with recurrent falls, generalized weakness, and back pain.  She endorses generalized weakness since she was diagnosed with COVID-19 in June 2022.  She has not seen her primary care provider for any of the symptoms and has not seen a neurosurgeon in over 3 years.   Patient was found to have fungemia.  She also had large antral ulcer.  She had melanotic stools with a significant drop in her hemoglobin and hematocrit.  GI was consulted and EGD was planned.  During scope she developed PEA arrest and respiratory distress after aspiration of blood.  CODE BLUE occurred and patient was resuscitated.  She received multiple units of blood as well as fresh frozen plasma and platelet transfusions during resuscitation.  She was intubated.  She was successfully extubated 10/18.   Palliative medicine team has been consulted to discuss goals of care.  Patient remains a full code.  Code Status: Full code  Prognosis:  Unable to determine  Discharge Planning: [place] for rehab with Palliative care service follow-up  Recommendations/Plan: I met again with Carolyn Herring and her brother Carolyn Herring at bedside.  Venia has been progressing and is on room air with no respiratory distress at this  time.  She smiles when I ask if she thinks this is progress.  She has worked with physical therapy and Occupational Therapy today.  She still complains of some pain but is overall in no apparent distress.   Clinton asked that we stepped out into the hallway to discuss.  He shared that he increased he had some deeper discussions after our initial meeting.  She does not want to change her plan of care and just wants to get better.  He says he has concerns that she will get better but what if this happens again.  He understands that she has the potential for bleeding, aspiration, pneumonia, and other complications.  He is ready to move forward with the advanced care documentation naming him as healthcare power of attorney.  He shares that Salene is pushing forward with all offered and available medical interventions because she wants to wait until her 21 year old daughter is a full adult and independent on her own.  He shares that once his her daughter is on her own that perhaps Lainee's goals will change.  He is asking what he should do should she start in the future to discuss wanting to make changes to her plan of care.  I shared that there is an outpatient palliative medicine team that can be an extra layer of support for [facility] and for himself.  I reviewed that they will meet with him and her at their convenience in the near future to discuss advanced care  planning, prognosis, end-of-life wishes, and reevaluate goals of care whenever they are ready.  He was grateful for this referral.   I also introduced the concept of a MOST form.  I did not see the need to fill it out at this point but I gave it to him as more of a reference for future discussions.  He said he appreciated having some form of an outline in order to have these discussions with her.  Plan: SNF with Palliative Services to f/u outpatient Care plan was discussed with patient, patient's brother [place]  Physical Exam HENT:     Head:  Normocephalic and atraumatic.     Mouth/Throat:     Mouth: Mucous membranes are moist.  Cardiovascular:     Rate and Rhythm: Normal rate.     Pulses: Normal pulses.  Pulmonary:     Effort: Pulmonary effort is normal.  Abdominal:     Palpations: Abdomen is soft.  Skin:    General: Skin is warm.  Neurological:     Mental Status: She is alert. Mental status is at baseline.  Psychiatric:        Mood and Affect: Mood normal.        Behavior: Behavior normal.        Judgment: Judgment normal.               Total Time 25 minutes Prolonged Time Billed  no   Greater than 50%  of this time was spent counseling and coordinating care related to the above assessment and plan.  Thank you for allowing the Palliative Medicine Team to assist in the care of this patient.  Fabrica Ilsa Iha, FNP-BC Palliative Medicine Team Team Phone # [phone]

## 2021-01-14 NOTE — Evaluation (Signed)
Clinical/Bedside Swallow Evaluation Patient Details  Name: Carolyn Herring MRN: [id number] Date of Birth: Sep 28, 1975  Today's Date: 01/14/2021 Time: SLP Start Time (ACUTE ONLY): 1010 SLP Stop Time (ACUTE ONLY): 1110 SLP Time Calculation (min) (ACUTE ONLY): 60 min  Past Medical History:  Past Medical History:  Diagnosis Date   Hypertension    Spina bifida [facility])    Past Surgical History:  Past Surgical History:  Procedure Laterality Date   ESOPHAGOGASTRODUODENOSCOPY N/A 01/10/2021   Procedure: ESOPHAGOGASTRODUODENOSCOPY (EGD);  Surgeon: Toledo, Boykin Nearing, MD;  Location: [facility] ENDOSCOPY;  Service: Gastroenterology;  Laterality: N/A;   ESOPHAGOGASTRODUODENOSCOPY (EGD) WITH PROPOFOL N/A 01/05/2021   Procedure: ESOPHAGOGASTRODUODENOSCOPY (EGD) WITH PROPOFOL;  Surgeon: Regis Bill, MD;  Location: [facility] ENDOSCOPY;  Service: Endoscopy;  Laterality: N/A;   TEE WITHOUT CARDIOVERSION N/A 01/08/2021   Procedure: TRANSESOPHAGEAL ECHOCARDIOGRAM (TEE);  Surgeon: Antonieta Iba, MD;  Location: [facility] ORS;  Service: Cardiovascular;  Laterality: N/A;   HPI:  Pt is a 45 yo female with a PMH of HTN, Hydrocephalus, and 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 last revised in 2019.  She has not seen a neurosurgeon in 3 yrs.  She presented to [facility] ER on 10/10 via EMS from home with recurrent falls, generalized weakness, and back pain.  She also endorsed several week hx of abdominal pain and diarrhea.  She was diagnosed with COVID-19 in 08/2020, and has had worsening generalized weakness since the dx.  She has not seen a PCP for evaluation of symptoms.  She does live w/ a brother per her report; her aunt lives next door.  EMS had to break her door down to gain entry d/t pt being unable to get to the door.  Was admitted and subsequently resuscitated with blood transfusions and IV fluids and was able to transfer to the regular medical floor.  Her encephalopathy cleared.   She  underwent upper endoscopy on 05 January 2021 that revealed a very large antral ulcer with adherent clot.  Patient was found to have fungemia.  She also had large antral ulcer.  She had melanotic stools with a significant drop in her hemoglobin and hematocrit.  GI was consulted and EGD was planned.  During scope she developed PEA arrest and respiratory distress after aspiration of blood.  CODE BLUE occurred and patient was resuscitated.  She received multiple units of blood as well as fresh frozen plasma and platelet transfusions during resuscitation.  She was intubated.  She was successfully extubated 10/18.   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  CXR: Development of diffuse interstitial and airspace disease, at least  partially felt to be secondary to pulmonary edema.    Assessment / Plan / Recommendation  Clinical Impression  Pt was initially screened on 01/08/2021 and denied any difficulty swallowing and is currently on a regular diet; tolerates swallowing pills w/ water per NSG. Pt sipped water via straw while in room. Pt conversed in general conversation then w/out deficits. Pt remained on a Regular diet at that time w/ soft foods. Pt had a decline in status during this admit; GI following for large antral ulcer and melanotic stools.   Pt seen this day for BSE. Pt appears to present w/ adequate oropharyngeal phase swallow function w/ No oropharyngeal phase dysphagia noted, No neuromuscular deficits noted. Pt consumed po trials w/ No overt, clinical s/s of aspiration during po trials. Pt appears at reduced risk for aspiration following  general aspiration precautions. Eval was limited to liquids and purees d/t GI issues/status currently(per MD).   During po trials, pt consumed consistencies w/ no overt coughing, decline in vocal quality, or change in respiratory presentation during/post trials. Oral phase appeared [facility] w/ timely bolus management and  control of bolus propulsion for A-P transfer for swallowing. Oral clearing achieved w/ all trial consistencies. OM Exam appeared [facility] w/ no unilateral weakness noted. Speech Clear; low volume and min slow, basic responses. Pt fed self by Holding Cup to drink but required support w/ food eating.   Recommend a Puree consistency diet w/ gravies (MD to inform when solids can be upgraded); Thin liquids. Recommend pt help Hold Cup to drink. Recommend general aspiration precautions, Support w/ feeding. Pills WHOLE in Puree for safer, easier swallowing. Education given on Pills in Puree; food consistencies and easy to eat options; general aspiration precautions. MD/NSG updated, agreed. ST services will monitor status when solid foods can be introduced per MD. SLP Visit Diagnosis: Dysphagia, unspecified (R13.10)    Aspiration Risk  Mild aspiration risk;Risk for inadequate nutrition/hydration (reduced following general precautions)    Diet Recommendation   Puree consistency diet w/ gravies (MD to inform when solids can be upgraded); Thin liquids. Recommend pt help Hold Cup to drink. Recommend general aspiration precautions, Support w/ feeding.  Medication Administration: Whole meds with puree    Other  Recommendations Recommended Consults:  (Dietician f/u) Oral Care Recommendations: Oral care BID;Oral care before and after PO;Staff/trained caregiver to provide oral care Other Recommendations:  (n/a)    Recommendations for follow up therapy are one component of a multi-disciplinary discharge planning process, led by the attending physician.  Recommendations may be updated based on patient status, additional functional criteria and insurance authorization.  Follow up Recommendations None      Frequency and Duration min 2x/week  1 week       Prognosis Prognosis for Safe Diet Advancement: Good Barriers to Reach Goals: Time post onset;Severity of deficits;Cognitive deficits Barriers/Prognosis Comment:  baseline status      Swallow Study   General Date of Onset: 01/04/21 HPI: Pt is a 45 yo female with a PMH of HTN, Hydrocephalus, and 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 last revised in 2019.  She has not seen a neurosurgeon in 3 yrs.  She [facility] ER on 10/10 via EMS from home with recurrent falls, generalized weakness, and back pain.  She also endorsed several week hx of abdominal pain and diarrhea.  She was diagnosed with COVID-19 in 08/2020, and has had worsening generalized weakness since the dx.  She has not seen a PCP for evaluation of symptoms.  She does live w/ a brother per her report; her aunt lives next door.  EMS had to break her door down to gain entry d/t pt being unable to get to the door.  Was admitted and subsequently resuscitated with blood transfusions and IV fluids and was able to transfer to the regular medical floor.  Her encephalopathy cleared.  She underwent upper endoscopy on 05 January 2021 that revealed a very large antral ulcer with adherent clot.  Patient was found to have fungemia.  She also had large antral ulcer.  She had melanotic stools with a significant drop in her hemoglobin and hematocrit.  GI was consulted and EGD was planned.  During scope she developed PEA arrest and respiratory distress after aspiration of blood.  CODE BLUE occurred and patient was resuscitated.  She received multiple units of blood as well as fresh frozen plasma and platelet transfusions during resuscitation.  She was intubated.  She was successfully extubated 10/18.  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  CXR: Development of diffuse interstitial and airspace disease, at least  partially felt to be secondary to pulmonary edema. Type of Study: Bedside Swallow Evaluation Previous Swallow Assessment: none: screened post admit w/ no deficits reported Diet Prior to this Study: NPO Temperature Spikes Noted: No  (wbc 12.1) Respiratory Status: Nasal cannula (2L) History of Recent Intubation: Yes Length of Intubations (days): 3 days Date extubated: 01/12/21 Behavior/Cognition: Alert;Cooperative;Pleasant mood;Requires cueing Oral Cavity Assessment: Within Functional Limits Oral Care Completed by SLP: Yes Oral Cavity - Dentition: Edentulous Vision: Functional for self-feeding Self-Feeding Abilities: Able to feed self;Needs assist;Needs set up;Total assist Patient Positioning: Upright in bed (needed support) Baseline Vocal Quality: Normal;Low vocal intensity (adequate) Volitional Cough: Strong Volitional Swallow: Able to elicit    Oral/Motor/Sensory Function Overall Oral Motor/Sensory Function: Within functional limits   Ice Chips Ice chips: Within functional limits Presentation: Spoon (fed; 3 trials)   Thin Liquid Thin Liquid: Within functional limits Presentation: Cup;Self Fed;Straw (supported well; ~6 ozs total)    Nectar Thick Nectar Thick Liquid: Not tested   Honey Thick Honey Thick Liquid: Not tested   Puree Puree: Within functional limits Presentation: Spoon (fed; 12+ trials)   Solid     Solid: Not tested Other Comments: GI         Jerilynn Som, MS, CCC-SLP Speech Language Pathologist Rehab Services [phone] Carolyn Herring 01/14/2021,12:41 PM

## 2021-01-14 NOTE — Progress Notes (Deleted)
Patient arrived to floor from ICU.  Reported to have yellow MEWs in ICU and has continued on arrival to floor due to increased heartrate.

## 2021-01-14 NOTE — Progress Notes (Signed)
GI Inpatient Follow-up Note  Subjective:  Patient seen and doing well. Moved out of ICU.  Scheduled Inpatient Medications:   budesonide (PULMICORT) nebulizer solution  0.5 mg Nebulization BID   Chlorhexidine Gluconate Cloth  6 each Topical Daily   feeding supplement  237 mL Oral TID BM   [START ON 01/15/2021] influenza vac split quadrivalent PF  0.5 mL Intramuscular Tomorrow-1000   ipratropium-albuterol  3 mL Nebulization TID   mouth rinse  15 mL Mouth Rinse 10 times per day   metoprolol tartrate  25 mg Oral BID   [START ON 01/15/2021] multivitamin with minerals  1 tablet Oral Daily   pantoprazole (PROTONIX) IV  40 mg Intravenous Q12H   sodium chloride flush  10-40 mL Intracatheter Q12H   vitamin B-12  1,000 mcg Oral Daily    Continuous Inpatient Infusions:    ampicillin-sulbactam (UNASYN) IV 200 mL/hr at 01/14/21 1432   anidulafungin 78 mL/hr at 01/14/21 1432    PRN Inpatient Medications:  acetaminophen, albuterol, hydrALAZINE, loperamide, ondansetron (ZOFRAN) IV, oxyCODONE, sodium chloride flush  Review of Systems:  Review of Systems  Constitutional:  Positive for malaise/fatigue. Negative for chills and fever.  Respiratory:  Negative for cough.   Cardiovascular:  Negative for chest pain.  Gastrointestinal:  Positive for abdominal pain. Negative for blood in stool, constipation, diarrhea, melena, nausea and vomiting.  Neurological:  Negative for focal weakness.  Psychiatric/Behavioral:  Negative for substance abuse.   All other systems reviewed and are negative.   Physical Examination: BP 127/79   Pulse (!) 106   Temp 98 F (36.7 C) (Oral)   Resp (!) 21   Ht 5' (1.524 m)   Wt 54.3 kg   SpO2 96%   BMI 23.38 kg/m  Gen: NAD, alert and oriented x 4 HEENT: PEERLA, EOMI, Neck: supple, no JVD or thyromegaly Chest: No respiratory distress CV: RRR, no m/g/c/r Abd: soft, mild tenderness Ext: no edema, well perfused with 2+ pulses, Skin: no rash or lesions  noted Lymph: no LAD  Data: Lab Results  Component Value Date   WBC 12.1 (H) 01/14/2021   HGB 13.6 01/14/2021   HCT 41.0 01/14/2021   MCV 87.2 01/14/2021   PLT 185 01/14/2021   Recent Labs  Lab 01/12/21 0530 01/13/21 0950 01/14/21 0611  HGB 14.2 13.5 13.6    Lab Results  Component Value Date   NA 138 01/14/2021   K 3.2 (L) 01/14/2021   CL 107 01/14/2021   CO2 23 01/14/2021   BUN 9 01/14/2021   CREATININE 0.46 01/14/2021   Lab Results  Component Value Date   ALT 13 01/04/2021   AST 20 01/04/2021   ALKPHOS 82 01/04/2021   BILITOT 0.6 01/04/2021   Recent Labs  Lab 01/10/21 1307  APTT 57*  INR 1.9*    Assessment/Plan: Carolyn Herring is a 46 y.o. lady with history of hypertension and spina bifida s/p VP shunt with candida blood stream infection from large gastric ulcer s/p PEA arrest after EGD over weekend. Hemoglobin stable.  Recommendations:  - IV PPI BID while in hospital - monitor hemoglobins - monitor for overt GI bleeding - appreciate surgical assistance - nutrition as able - supportive care per primary team - PT/OT - will need repeat EGD in 12 weeks as an outpatient   Will continue to follow. Please call with any questions or concerns.  Merlyn Lot MD, MPH [facility] GI

## 2021-01-14 NOTE — Progress Notes (Signed)
PHARMACY CONSULT NOTE - FOLLOW UP  Pharmacy Consult for Electrolyte Monitoring and Replacement   Recent Labs: Potassium (mmol/L)  Date Value  01/14/2021 3.2 (L)  02/11/2013 5.1   Magnesium (mg/dL)  Date Value  01/75/1025 1.9   Calcium (mg/dL)  Date Value  85/27/7824 7.9 (L)   Calcium, Total (mg/dL)  Date Value  23/53/6144 9.1   Albumin (g/dL)  Date Value  31/54/0086 1.9 (L)  02/11/2013 3.5   Phosphorus (mg/dL)  Date Value  76/19/5093 3.5   Sodium (mmol/L)  Date Value  01/14/2021 138  02/11/2013 138     Assessment: 45 year old female presented with recurrent falls, weakness and back pain. Patient with PMH HTN, hydrocephalus and spina bifida s/p VP shunt. Patient with candidemia (C.krusei) thought to be related to gastric ulcer and translocation from the gut. Shunt does not appear to be infected, TEE negative for endocarditis. Patient also with GIB s/p transfusions and EGD showing ulcer, bleeding appears to be resolving. Pharmacy consult for electrolyte management.    Goal of Therapy:  Electrolytes WNL  Plan:  --Patient received potassium 40 mEq this morning --Recheck with morning labs  Pricilla Riffle, PharmD, BCPS Clinical Pharmacist 01/14/2021 1:57 PM

## 2021-01-14 NOTE — Progress Notes (Signed)
[facility] SURGICAL PROGRESS NOTE (cpt [phone])  Hospital Day(s): 10.   Post op day(s): 4 Days Post-Op.   Interval History:  Patient seen and examined She is resting comfortably; NAD No abdominal pain, nausea, emesis  Labs are in process No further evidence of bleeding  Plan for CLD pending SLP evaluation  Working with therapies; recommending SNF  Review of Systems:  Constitutional: denies fever, chills  HEENT: denies cough or congestion  Respiratory: denies any shortness of breath  Cardiovascular: denies chest pain or palpitations  Gastrointestinal: denies abdominal pain, N/V, or diarrhea/and bowel function as per interval history Genitourinary: denies burning with urination or urinary frequency  Vital signs in last 24 hours: [min-max] current  Temp:  [98.4 F (36.9 C)-99 F (37.2 C)] 98.6 F (37 C) (10/20 0400) Pulse Rate:  [92-106] 99 (10/20 0400) Resp:  [18-30] 22 (10/20 0400) BP: (120-156)/(73-113) 127/113 (10/20 0400) SpO2:  [93 %-100 %] 100 % (10/20 0400) Arterial Line BP: (133-157)/(76-89) 135/76 (10/19 1000) FiO2 (%):  [40 %] 40 % (10/19 0740)     Height: 5' (152.4 cm) Weight: 54.3 kg BMI (Calculated): 23.38   Intake/Output last 2 shifts:  10/19 0701 - 10/20 0700 In: -  Out: 600 [Urine:600]   Physical Exam:  Constitutional: Alert, interactive, NAD HENT: normocephalic without obvious abnormality Respiratory: On [place], no respiratory distress Cardiovascular: regular rate and sinus rhythm  Gastrointestinal: Soft, she does not appear tender, non-distended, no respiratory distress   Labs:  CBC Latest Ref Rng & Units 01/13/2021 01/12/2021 01/11/2021  WBC 4.0 - 10.5 K/uL 19.1(H) 25.9(H) -  Hemoglobin 12.0 - 15.0 g/dL 58.0 99.8 33.8  Hematocrit 36.0 - 46.0 % 41.8 41.4 41.9  Platelets 150 - 400 K/uL 160 133(L) -   CMP Latest Ref Rng & Units 01/13/2021 01/12/2021 01/11/2021  Glucose 70 - 99 mg/dL 25(K) 539(J) -  BUN 6 - 20 mg/dL 14 13 -   Creatinine 6.73 - 1.00 mg/dL 4.19 3.79 -  Sodium 024 - 145 mmol/L 139 135 -  Potassium 3.5 - 5.1 mmol/L 3.8 4.2 4.3  Chloride 98 - 111 mmol/L 107 101 -  CO2 22 - 32 mmol/L 21(L) 27 -  Calcium 8.9 - 10.3 mg/dL 8.2(L) 7.3(L) 6.9(L)  Total Protein 6.5 - 8.1 g/dL - - -  Total Bilirubin 0.3 - 1.2 mg/dL - - -  Alkaline Phos 38 - 126 U/L - - -  AST 15 - 41 U/L - - -  ALT 0 - 44 U/L - - -     Imaging studies: No new pertinent imaging studies   Assessment/Plan: (ICD-10's: R57.8) 45 y.o. female with, now stable, anemia secondary to massive GI hemorrhage from gastric ulcer, complicated by cardiac arrest   - Should be okay to initiate CLD with Boost breeze for added protein source; would slowly advance as tolerated; pending SLP evaluation   - No plans for surgical intervention at this time. Patient remains without signs of bleeding and Hgb remained stable s/p transfusions. If she has any signs of recurrence, may need to proceed emergently  - Monitor abdominal examination  - Pain control prn; antiemetics prn  - Appreciate GI assistance; continue IV PPI  - Monitor H&H closely; transfuse as needed  - Further management per primary service; we will follow peripherally for now; please call with questions/concerns or if bleeding recurs. We will be readily available.   All of the above findings and recommendations were discussed with the medical team.  -- Lynden Oxford, PA-C  [facility] 01/14/2021, 7:20 AM [phone] M-F: 7am - 4pm

## 2021-01-14 NOTE — Progress Notes (Signed)
Physical Therapy Treatment Patient Details Name: Carolyn Herring MRN: [id number] DOB: 12-31-1975 Today's Date: 01/14/2021   History of Present Illness 45 y.o. F arriving 10/10 to ED after a fall with decreased responsiveness and hx of progressive recent abdominal pain and diarrhea.  Was found to have severe GI bleed.  Imaging also showing acute infarct.  Pt coded and required intubation 01-17-23. 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    PT Comments    Pt eager to work with PT and showed great effort t/o the session, however she remains functionally very limited as well as having significant pain in ribs/chest.  We attempted standing but pt did not have the strength or the pain tolerance to lift hips off bed even minimal.  Pt on room air t/o the effort with sats upper 80s to mid 90s t/o the session.     Recommendations for follow up therapy are one component of a multi-disciplinary discharge planning process, led by the attending physician.  Recommendations may be updated based on patient status, additional functional criteria and insurance authorization.  Follow Up Recommendations  SNF;Supervision/Assistance - 24 hour     Equipment Recommendations   (TBD)    Recommendations for Other Services       Precautions / Restrictions Precautions Precautions: Fall Restrictions Weight Bearing Restrictions: No     Mobility  Bed Mobility Overal bed mobility: Needs Assistance Bed Mobility: Supine to Sit;Sit to Supine Rolling: Mod assist   Supine to sit: Mod assist Sit to supine: Mod assist   General bed mobility comments: Pt showed good effort, but was very functionally limited.  Heavy assist needed to get torso upright, able to scoot minimally once upright    Transfers Overall transfer level: Needs assistance Equipment used: Rolling walker (2 wheeled) Transfers: Sit  to/from Stand Sit to Stand: Total assist         General transfer comment: Pt was eager (and requested) to try standing.  Heavy cuing for set up and appropriate sequencing. She showed very good effort but simply did not have the leg strength to rise and had too much rib pain for PT to be able to give enough assist to even rise hips more than 1" off bed  Ambulation/Gait                 Stairs             Wheelchair Mobility    Modified Rankin (Stroke Patients Only)       Balance Overall balance assessment: Needs assistance Sitting-balance support: Feet supported;Single extremity supported Sitting balance-Leahy Scale: Poor Sitting balance - Comments: Pt initially unable to maintain balance, with assist to shift forward/sqare to edge she was able to maintain sistting with CGA for a few minutes at time before leaning/needing assist                                    Cognition Arousal/Alertness: Lethargic Behavior During Therapy: [facility] for tasks assessed/performed Overall Cognitive Status: Impaired/Different from baseline                                        Exercises General Exercises - Lower Extremity Ankle Circles/Pumps: AROM;Strengthening;10 reps;Both Quad Sets: 10  reps;Strengthening Short Arc Quad: AROM;AAROM;10 reps;Both (AROM on R, AAROM on L) Heel Slides: AAROM;10 reps (pain limited on R (IV line), very weak on L) Hip ABduction/ADduction: AAROM;10 reps;Both;Strengthening (AAROM on L, light resistance on R)    General Comments        Pertinent Vitals/Pain Pain Assessment: 0-10 Pain Score: 8  Pain Location: ribs/chest (L>R)    Home Living                      Prior Function            PT Goals (current goals can now be found in the care plan section) Progress towards PT goals: Progressing toward goals    Frequency    7X/week      PT Plan Current plan remains appropriate    Co-evaluation               AM-PAC PT "6 Clicks" Mobility   Outcome Measure  Help needed turning from your back to your side while in a flat bed without using bedrails?: A Lot Help needed moving from lying on your back to sitting on the side of a flat bed without using bedrails?: A Lot Help needed moving to and from a bed to a chair (including a wheelchair)?: Total Help needed standing up from a chair using your arms (e.g., wheelchair or bedside chair)?: Total Help needed to walk in hospital room?: Total Help needed climbing 3-5 steps with a railing? : Total 6 Click Score: 8    End of Session Equipment Utilized During Treatment: Gait belt Activity Tolerance: Patient limited by fatigue Patient left: with bed alarm set;with call bell/phone within reach;with family/visitor present Nurse Communication: Mobility status PT Visit Diagnosis: Other abnormalities of gait and mobility (R26.89);Repeated falls (R29.6);History of falling (Z91.81);Muscle weakness (generalized) (M62.81)     Time: 1275-1700 PT Time Calculation (min) (ACUTE ONLY): 48 min  Charges:  $Therapeutic Exercise: 23-37 mins $Therapeutic Activity: 8-22 mins                     Malachi Pro, DPT 01/14/2021, 6:18 PM

## 2021-01-14 NOTE — Progress Notes (Signed)
Called brother Lance Coon to let him know of patient's transfer to room 227.

## 2021-01-14 NOTE — Progress Notes (Signed)
[facility]  - [place] at [facility]   PATIENT NAME: Carolyn Herring    MR#:  [id number]  DATE OF BIRTH:  Dec 08, 1975  SUBJECTIVE:   patient more awake and communicative. She is moving all her extremities well. Able to swallow with speech evaluation earlier. No family at bedside. REVIEW OF SYSTEMS:   Review of Systems  Constitutional:  Negative for chills, fever and weight loss.  HENT:  Negative for ear discharge, ear pain and nosebleeds.   Eyes:  Negative for blurred vision, pain and discharge.  Respiratory:  Negative for sputum production, shortness of breath, wheezing and stridor.   Cardiovascular:  Negative for chest pain, palpitations, orthopnea and PND.  Gastrointestinal:  Negative for abdominal pain, diarrhea, nausea and vomiting.  Genitourinary:  Negative for frequency and urgency.  Musculoskeletal:  Negative for back pain and joint pain.  Neurological:  Negative for sensory change, speech change, focal weakness and weakness.  Psychiatric/Behavioral:  Negative for depression and hallucinations. The patient is not nervous/anxious.   Tolerating Diet:yes Tolerating PT: SNF  DRUG ALLERGIES:   Allergies  Allergen Reactions   Tape Dermatitis    **BLISTERS** **BLISTERS**    Codeine Itching   Hydrogen Peroxide Other (See Comments)    Skin Irritation   Latex Other (See Comments)    Skin Irritation; blisters     VITALS:  Blood pressure 127/79, pulse (!) 106, temperature 98 F (36.7 C), temperature source Oral, resp. rate (!) 21, height 5' (1.524 m), weight 54.3 kg, SpO2 96 %.  PHYSICAL EXAMINATION:   Physical Exam  GENERAL:  45 y.o.-year-old patient lying in the bed with no acute distress.  HEENT: Head atraumatic, normocephalic. Oropharynx and nasopharynx clear.  LUNGS: Normal breath sounds bilaterally, no wheezing, rales, rhonchi. No use of accessory muscles of respiration.  CARDIOVASCULAR: S1, S2 normal. No murmurs, rubs, or gallops.  ABDOMEN:  Soft, nontender, nondistended. Bowel sounds present. No organomegaly or mass.  EXTREMITIES: No cyanosis, clubbing or edema b/l.    NEUROLOGIC: left hand contracted moves all extremities PSYCHIATRIC:  patient is alert and oriented x 3.  SKIN:   Pressure Injury 01/06/21 Ankle Anterior;Right Stage 2 -  Partial thickness loss of dermis presenting as a shallow open injury with a red, pink wound bed without slough. (Active)  01/06/21 2200  Location: Ankle  Location Orientation: Anterior;Right  Staging: Stage 2 -  Partial thickness loss of dermis presenting as a shallow open injury with a red, pink wound bed without slough.  Wound Description (Comments):   Present on Admission:     LABORATORY PANEL:  CBC Recent Labs  Lab 01/14/21 0611  WBC 12.1*  HGB 13.6  HCT 41.0  PLT 185    Chemistries  Recent Labs  Lab 01/14/21 0611  NA 138  K 3.2*  CL 107  CO2 23  GLUCOSE 93  BUN 9  CREATININE 0.46  CALCIUM 7.9*  MG 1.9   Cardiac Enzymes No results for input(s): TROPONINI in the last 168 hours. RADIOLOGY:  No results found. ASSESSMENT AND PLAN:   45 yo female with a PMH of HTN, Hydrocephalus, and 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 last revised in 2019.  She has not seen a neurosurgeon in 3 yrs.  She presented to [facility] ER on 10/10 via EMS from home with recurrent falls, generalized weakness, and back pain.  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 - Aggressive iv fluid resuscitation to maintain map >65  - Transfuse for hgb <7 - Continue IV protonix--change to po in am - Avoid all NSAID's  - General Surgery and GI consulted appreciate input --pt will  need EGD in few weeks as out pt   Hypokalemia~resolved  - Replace electrolytes as indicated  - Monitor UOP    Leukocytosis secondary to aspiration pneumonia  Blood cultures 10/10: candida krusei anaerobic bottle only - Trend WBC and monitor fever curve  - Continue unasyn (10/22) and Eraxis (till 10/25)    Recurrent encephalopathy post arrest ~MR Brain 10/18 revealed 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 No midline shift. Inflammatory paranasal sinus and mastoid disease, new since prior MRI. - Supportive care - Avoid sedatives as able - PT/OT consulted appreciate input  - Neurology consulted appreciate input --NO Asa due to Gi bleed  Family communication :none Consults : ID, neurology, G.I. CODE STATUS: full DVT Prophylaxis :SCD Level of care: Med-Surg Status is: Inpatient  continue PT OT. TOC for discharge planning to rehab.   TOTAL TIME TAKING CARE OF THIS PATIENT: 25 minutes.  >50% time spent on counselling and coordination of care  Note: This dictation was prepared with Dragon dictation along with smaller phrase technology. Any transcriptional errors that result from this process are unintentional.  Enedina Finner M.D    [facility]   CC: Primary care physician; Patient, No Pcp Per (Inactive) Patient ID: Carolyn Herring, female   DOB: 12-26-1975, 45 y.o.   MRN: [id number]

## 2021-01-14 NOTE — TOC Progression Note (Signed)
Transition of Care [facility]) - Progression Note    Patient Details  Name: Carolyn Herring MRN: [id number] Date of Birth: 15-Jan-1976  Transition of Care [facility]) CM/SW Contact  Hetty Ely, RN Phone Number: 01/14/2021, 4:00 PM  Clinical Narrative: Spoke with Attending who says patient is stable for discharge to SNF/Rehab. Called brother, Silver Huguenin to discuss [facility] bed offer, Brother says he prefers closer place however if not able [place] is acceptable. Called [place], [phone] to confirm offer, no answer left voice message to return call.    Expected Discharge Plan: Skilled Nursing Facility Barriers to Discharge: Continued Medical Work up  Expected Discharge Plan and Services Expected Discharge Plan: Skilled Nursing Facility In-house Referral: Clinical Social Work   Post Acute Care Choice: Skilled Nursing Facility Living arrangements for the past 2 months: Single Family Home                                       Social Determinants of Health (SDOH) Interventions    Readmission Risk Interventions No flowsheet data found.

## 2021-01-15 DIAGNOSIS — R578 Other shock: Secondary | ICD-10-CM | POA: Diagnosis not present

## 2021-01-15 LAB — CBC WITH DIFFERENTIAL/PLATELET
Abs Immature Granulocytes: 0.08 10*3/uL — ABNORMAL HIGH (ref 0.00–0.07)
Basophils Absolute: 0 10*3/uL (ref 0.0–0.1)
Basophils Relative: 0 %
Eosinophils Absolute: 0.5 10*3/uL (ref 0.0–0.5)
Eosinophils Relative: 4 %
HCT: 41.9 % (ref 36.0–46.0)
Hemoglobin: 13.4 g/dL (ref 12.0–15.0)
Immature Granulocytes: 1 %
Lymphocytes Relative: 8 %
Lymphs Abs: 0.9 10*3/uL (ref 0.7–4.0)
MCH: 27.8 pg (ref 26.0–34.0)
MCHC: 32 g/dL (ref 30.0–36.0)
MCV: 86.9 fL (ref 80.0–100.0)
Monocytes Absolute: 1.2 10*3/uL — ABNORMAL HIGH (ref 0.1–1.0)
Monocytes Relative: 9 %
Neutro Abs: 9.7 10*3/uL — ABNORMAL HIGH (ref 1.7–7.7)
Neutrophils Relative %: 78 %
Platelets: 238 10*3/uL (ref 150–400)
RBC: 4.82 MIL/uL (ref 3.87–5.11)
RDW: 17.4 % — ABNORMAL HIGH (ref 11.5–15.5)
WBC: 12.4 10*3/uL — ABNORMAL HIGH (ref 4.0–10.5)
nRBC: 0 % (ref 0.0–0.2)

## 2021-01-15 LAB — PHOSPHORUS: Phosphorus: 2.9 mg/dL (ref 2.5–4.6)

## 2021-01-15 LAB — POTASSIUM: Potassium: 3.7 mmol/L (ref 3.5–5.1)

## 2021-01-15 LAB — VITAMIN B1: Vitamin B1 (Thiamine): 135.2 nmol/L (ref 66.5–200.0)

## 2021-01-15 LAB — GLUCOSE, CAPILLARY: Glucose-Capillary: 112 mg/dL — ABNORMAL HIGH (ref 70–99)

## 2021-01-15 LAB — MAGNESIUM: Magnesium: 2.1 mg/dL (ref 1.7–2.4)

## 2021-01-15 MED ORDER — PANTOPRAZOLE SODIUM 40 MG PO TBEC
40.0000 mg | DELAYED_RELEASE_TABLET | Freq: Two times a day (BID) | ORAL | Status: DC
  Administered 2021-01-15 – 2021-01-18 (×6): 40 mg via ORAL
  Filled 2021-01-15 (×7): qty 1

## 2021-01-15 NOTE — TOC Progression Note (Signed)
Transition of Care [facility]) - Progression Note    Patient Details  Name: DAYJAH SELMAN MRN: [id number] Date of Birth: 1976-03-09  Transition of Care [facility]) CM/SW Contact  Beverly Sessions, RN Phone Number: 01/15/2021, 12:27 PM  Clinical Narrative:      Damaris Schooner with [name] at [facility] She confirms they can still accept patient and have authorization that is valid through 10/24  Met with patient and MD at bedside.  Per MD anticipated DC Monday.  Patient in agreement. MD to update Brother   Expected Discharge Plan: [place] Barriers to Discharge: Continued Medical Work up  Expected Discharge Plan and Services Expected Discharge Plan: [place] In-house Referral: Clinical Social Work   Post Acute Care Choice: [place] Living arrangements for the past 2 months: Single Family Home                                       Social Determinants of Health (SDOH) Interventions    Readmission Risk Interventions No flowsheet data found.

## 2021-01-15 NOTE — Progress Notes (Signed)
Patient refused Telemetry 

## 2021-01-15 NOTE — Progress Notes (Signed)
[facility]  - [place] at [facility]   PATIENT NAME: Carolyn Herring    MR#:  [id number]  DATE OF BIRTH:  1975/10/23  SUBJECTIVE:   patient more awake and communicative. She is moving all her extremities well.   No family at bedside. Eating better  by herself. C/o rib pain from the code. Encouraged her to work with physical therapy.  REVIEW OF SYSTEMS:   Review of Systems  Constitutional:  Negative for chills, fever and weight loss.  HENT:  Negative for ear discharge, ear pain and nosebleeds.   Eyes:  Negative for blurred vision, pain and discharge.  Respiratory:  Negative for sputum production, shortness of breath, wheezing and stridor.   Cardiovascular:  Negative for chest pain, palpitations, orthopnea and PND.  Gastrointestinal:  Negative for abdominal pain, diarrhea, nausea and vomiting.  Genitourinary:  Negative for frequency and urgency.  Musculoskeletal:  Negative for back pain and joint pain.  Neurological:  Negative for sensory change, speech change, focal weakness and weakness.  Psychiatric/Behavioral:  Negative for depression and hallucinations. The patient is not nervous/anxious.   Tolerating Diet:yes Tolerating PT: SNF  DRUG ALLERGIES:   Allergies  Allergen Reactions   Tape Dermatitis    **BLISTERS** **BLISTERS**    Codeine Itching   Hydrogen Peroxide Other (See Comments)    Skin Irritation   Latex Other (See Comments)    Skin Irritation; blisters     VITALS:  Blood pressure (!) 126/99, pulse (!) 106, temperature 99.6 F (37.6 C), temperature source Oral, resp. rate 18, height 5' (1.524 m), weight 54.3 kg, SpO2 91 %.  PHYSICAL EXAMINATION:   Physical Exam  GENERAL:  45 y.o.-year-old patient lying in the bed with no acute distress.  HEENT: Head atraumatic, normocephalic. Oropharynx and nasopharynx clear.  LUNGS: Normal breath sounds bilaterally, no wheezing, rales, rhonchi. No use of accessory muscles of respiration.  CARDIOVASCULAR:  S1, S2 normal. No murmurs, rubs, or gallops.  ABDOMEN: Soft, nontender, nondistended. Bowel sounds present. No organomegaly or mass.  EXTREMITIES: No cyanosis, clubbing or edema b/l.    NEUROLOGIC: left hand contracted moves all extremities PSYCHIATRIC:  patient is alert and oriented x 3.  SKIN:   Pressure Injury 01/06/21 Ankle Anterior;Right Stage 2 -  Partial thickness loss of dermis presenting as a shallow open injury with a red, pink wound bed without slough. (Active)  01/06/21 2200  Location: Ankle  Location Orientation: Anterior;Right  Staging: Stage 2 -  Partial thickness loss of dermis presenting as a shallow open injury with a red, pink wound bed without slough.  Wound Description (Comments):   Present on Admission:     LABORATORY PANEL:  CBC Recent Labs  Lab 01/15/21 0402  WBC 12.4*  HGB 13.4  HCT 41.9  PLT 238     Chemistries  Recent Labs  Lab 01/14/21 0611 01/15/21 0402  NA 138  --   K 3.2* 3.7  CL 107  --   CO2 23  --   GLUCOSE 93  --   BUN 9  --   CREATININE 0.46  --   CALCIUM 7.9*  --   MG 1.9 2.1    Cardiac Enzymes No results for input(s): TROPONINI in the last 168 hours. RADIOLOGY:  No results found. ASSESSMENT AND PLAN:   45 yo female female with a PMH of HTN, Hydrocephalus, and 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 last revised in 2019.  She has not seen a neurosurgeon in 3  yrs.  She presented to [facility] ER on 10/10 via EMS from home with recurrent falls, generalized weakness, and back pain.  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 - Aggressive iv fluid resuscitation to maintain map >65  - Transfuse for hgb <7 - Continue IV protonix--change to po today - Avoid all NSAID's  - General Surgery and GI consulted appreciate input --pt will  need EGD in few weeks as out pt -- tolerating PO diet   Hypokalemia~resolved  - Replace electrolytes as indicated    Leukocytosis secondary to aspiration pneumonia  Blood cultures 10/10: candida krusei anaerobic bottle only - Trend WBC and monitor fever curve  - Continue unasyn (10/22) and Eraxis (till 10/25)    Recurrent encephalopathy post arrest ~MR Brain 10/18 revealed 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 No midline shift. Inflammatory paranasal sinus and mastoid disease, new since prior MRI. - Supportive care - Avoid sedatives as able - PT/OT consulted appreciate input  - Neurology consulted appreciate input --NO Asa due to Gi bleed -patient needs extensive rehab.  Family communication : brother Lance Coon discussed rehab option and he is agreeable with the plan Consults : ID, neurology, G.I. CODE STATUS: full DVT Prophylaxis :SCD Level of care: Med-Surg Status is: Inpatient  continue PT OT. TOC for discharge planning to rehab likely Monday if continues to show improvement. Patient has a bed at [facility]   TOTAL TIME TAKING CARE OF THIS PATIENT: 25 minutes.  >50% time spent on counselling and coordination of care  Note: This dictation was prepared with Dragon dictation along with smaller phrase technology. Any transcriptional errors that result from this process are unintentional.  Carolyn Herring M.D    [facility]   CC: Primary care  physician; Patient, No Pcp Per (Inactive) Patient ID: Carolyn Herring, female   DOB: 1975/07/13, 45 y.o.   MRN: [id number]

## 2021-01-15 NOTE — Progress Notes (Addendum)
Speech Language Pathology Treatment: Dysphagia  Patient Details Name: Carolyn Herring MRN: [id number] DOB: 05/10/1975 Today's Date: 01/15/2021 Time: 1410-1445 SLP Time Calculation (min) (ACUTE ONLY): 35 min  Assessment / Plan / Recommendation Clinical Impression  Pt seen for ongoing assessment of swallowing, toleration of diet and education on general aspiration precautions including Small sips, Slowly. She is alert, verbally responsive and engaged in conversation w/ SLP; slow motor and verbal responses - Baseline for pt. Edentulous at Baseline. Pt explained general aspiration precautions and agreed verbally to the need for following them especially sitting upright for all oral intake -- supported behind the back for full upright sitting. Also discussed the need for Small sips Slowly when drinking thin liquids. She consumed trials of thin liquids via cup/straw - tolerated straw use adequately. NO overt clinical s/s of aspiration were noted w/ trials; respiratory status remained calm and unlabored, vocal quality clear b/t trials. Oral phase appeared [facility] for bolus management/control of bolus and timely A-P transfer for swallowing; oral clearing achieved w/ trials. NSG denied any deficits in swallowing as well.   Pt appears at reduced risk for aspriation when following general aspiration precautions. Recommend continue a Puree diet w/ gravies added to moisten foods; Thin liquids. GI following re: abdominal issues/surgery and is pleased w/ current toleration of foods/liquids. Pt has started on Ensure as well per GI. Recommend general aspiration precautions; Pills Whole in Puree; tray setup and positioning assistance for meals. Support w/ feeding at meals -- allow pt to help w/ feeding as well. ST services will f/u w/ toleration of diet while admitted. Precautions posted at bedside.      HPI HPI: Pt is a 45 yo female with a PMH of HTN, Hydrocephalus, and Spina Bifida s/p VP shunt with cervical tethered  cord release in 2011 last revised in 2019.  She has not seen a neurosurgeon in 3 yrs.  She presented to [facility] ER on 10/10 via EMS from home with recurrent falls, generalized weakness, and back pain.  She also endorsed several week hx of abdominal pain and diarrhea.  She was diagnosed with COVID-19 in 08/2020, and has had worsening generalized weakness since the dx.  She has not seen a PCP for evaluation of symptoms.  She does live w/ a brother per her report; her aunt lives next door.  EMS had to break her door down to gain entry d/t pt being unable to get to the door.  Was admitted and subsequently resuscitated with blood transfusions and IV fluids and was able to transfer to the regular medical floor.  Her encephalopathy cleared.  She underwent upper endoscopy on 05 January 2021 that revealed a very large antral ulcer with adherent clot.  Patient was found to have fungemia.  She also had large antral ulcer.  She had melanotic stools with a significant drop in her hemoglobin and hematocrit.  GI was consulted and EGD was planned.  During scope she developed PEA arrest and respiratory distress after aspiration of blood.  CODE BLUE occurred and patient was resuscitated.  She received multiple units of blood as well as fresh frozen plasma and platelet transfusions during resuscitation.  She was intubated.  She was successfully extubated 10/18.  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  CXR: Development of diffuse interstitial and airspace disease, at least  partially felt to be secondary to pulmonary edema.      SLP Plan  Continue with current plan of care  Recommendations for follow up therapy are one component of a multi-disciplinary discharge planning process, led by the attending physician.  Recommendations may be updated based on patient status, additional functional criteria and insurance authorization.    Recommendations  Diet recommendations:  Dysphagia 1 (puree);Thin liquid Liquids provided via: Cup;Straw Medication Administration: Whole meds with puree (for ease of swallowing) Supervision: Staff to assist with self feeding;Full supervision/cueing for compensatory strategies;Patient able to self feed Compensations: Minimize environmental distractions;Slow rate;Small sips/bites;Lingual sweep for clearance of pocketing;Follow solids with liquid Postural Changes and/or Swallow Maneuvers: Out of bed for meals;Seated upright 90 degrees;Upright 30-60 min after meal                General recommendations:  (Dietician f/u) Oral Care Recommendations: Oral care BID;Oral care before and after PO;Staff/trained caregiver to provide oral care Follow up Recommendations: None SLP Visit Diagnosis: Dysphagia, unspecified (R13.10) Plan: Continue with current plan of care       GO                 Jerilynn Som, MS, CCC-SLP Speech Language Pathologist Rehab Services [phone] [facility]  01/15/2021, 2:47 PM

## 2021-01-15 NOTE — Progress Notes (Signed)
[facility])  Received referral for outpatient palliative care services.  ACC will f/u with patient and her family in the community.  Wallis Bamberg BSN, RN [facility] Liaison

## 2021-01-15 NOTE — Progress Notes (Signed)
Occupational Therapy Treatment Patient Details Name: Carolyn Herring MRN: [id number] DOB: Jun 16, 1975 Today's Date: 01/15/2021   History of present illness 45 y.o. F arriving 10/10 to ED after a fall with decreased responsiveness and hx of progressive recent abdominal pain and diarrhea.  Was found to have severe GI bleed.  Imaging also showing acute infarct.  Pt coded and required intubation 2023-01-29. 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   OT comments  Ms. Darrough seen for OT treatment on this date. Upon arrival to room pt awake/alert. Pt seated upright in bed and agreeable to tx. Requiring MAX A for hairwashing seated EOB~ 10 min. Sitting balance improved to CGA, EOB with BUE support. Pt MAX A for LBD, doffing B socks. Initial attempt this AM, pt deferred 2/2 pain, MD in room requesting return later in PM. Therapist educated pt about d/c recs ~10 min. Pt making good progress toward goals. Pt continues to benefit from skilled OT services to maximize return to PLOF and minimize risk of future falls, injury, caregiver burden, and readmission. Will continue to follow POC. Discharge recommendation remains appropriate.     Recommendations for follow up therapy are one component of a multi-disciplinary discharge planning process, led by the attending physician.  Recommendations may be updated based on patient status, additional functional criteria and insurance authorization.    Follow Up Recommendations  SNF    Equipment Recommendations  Other (comment) (defer to next venue of care)    Recommendations for Other Services      Precautions / Restrictions Precautions Precautions: Fall Restrictions Weight Bearing Restrictions: No       Mobility Bed Mobility Overal bed mobility: Needs Assistance Bed Mobility: Supine to Sit;Sit to Supine     Supine to sit: Max assist Sit to  supine: Max assist                          Balance Overall balance assessment: Needs assistance Sitting-balance support: Feet unsupported;Single extremity supported Sitting balance-Leahy Scale: Poor                                     ADL either performed or assessed with clinical judgement   ADL Overall ADL's : Needs assistance/impaired                                       General ADL Comments: Pt requires MOD A for hairwashing seated EOB~ 15 min. Pt MAX A for LBD, doffing B socks.     Vision       Perception     Praxis      Cognition Arousal/Alertness: Awake/alert Behavior During Therapy: WFL for tasks assessed/performed Overall Cognitive Status: Impaired/Different from baseline Area of Impairment: Following commands;Problem solving                       Following Commands: Follows one step commands inconsistently     Problem Solving: Slow processing;Decreased initiation;Difficulty sequencing;Requires verbal cues;Requires tactile cues General Comments: Improved since yesterday, pt requests therapists read board and respond. Board says  'My name is Shrika, I take longer to speak, please give me time to speak.'  Exercises Exercises: Other exercises General Exercises - Lower Extremity Ankle Circles/Pumps: AROM;Strengthening;10 reps;Both Other Exercises Other Exercises: Pt educ re: importance of mvmt to maintain functional mobility, pain mgmt techniques Other Exercises: Sup<>sit, hairbrushing, shampoo cap, therapeutic listening   Shoulder Instructions       General Comments      Pertinent Vitals/ Pain       Pain Assessment: Faces Faces Pain Scale: Hurts whole lot Pain Location: ribs Pain Descriptors / Indicators: Crying;Moaning  Home Living                                          Prior Functioning/Environment              Frequency  Min 2X/week        Progress  Toward Goals  OT Goals(current goals can now be found in the care plan section)  Progress towards OT goals: Progressing toward goals  Acute Rehab OT Goals Patient Stated Goal: get stronger back to walking OT Goal Formulation: With patient Time For Goal Achievement: 01/27/21 Potential to Achieve Goals: Good ADL Goals Pt Will Perform Grooming: with supervision;sitting;with set-up Pt Will Perform Lower Body Dressing: with mod assist;with caregiver independent in assisting Pt Will Transfer to Toilet: with max assist;with +2 assist;squat pivot transfer;bedside commode Pt Will Perform Toileting - Clothing Manipulation and hygiene: with mod assist;bed level  Plan Discharge plan remains appropriate    Co-evaluation                 AM-PAC OT "6 Clicks" Daily Activity     Outcome Measure   Help from another person eating meals?: A Little Help from another person taking care of personal grooming?: A Lot Help from another person toileting, which includes using toliet, bedpan, or urinal?: A Lot Help from another person bathing (including washing, rinsing, drying)?: A Lot Help from another person to put on and taking off regular upper body clothing?: A Lot Help from another person to put on and taking off regular lower body clothing?: A Lot 6 Click Score: 13    End of Session    OT Visit Diagnosis: Muscle weakness (generalized) (M62.81);Other symptoms and signs involving the nervous system (R29.898);Hemiplegia and hemiparesis Hemiplegia - Right/Left: Left Hemiplegia - dominant/non-dominant: Non-Dominant Hemiplegia - caused by: Cerebral infarction   Activity Tolerance Patient limited by pain;Patient tolerated treatment well   Patient Left in bed;with call bell/phone within reach;with bed alarm set (RT in room)   Nurse Communication          Time: 4540-9811 (11:31-11:40) OT Time Calculation (min): 34 min  Charges: OT General Charges $OT Visit: 1 Visit OT Treatments $Self  Care/Home Management : 8-22 mins $Therapeutic Activity: 8-22 mins  [facility], OTS   [facility] 01/15/2021, 2:26 PM

## 2021-01-15 NOTE — TOC Progression Note (Signed)
Transition of Care [facility]) - Progression Note    Patient Details  Name: Carolyn Herring MRN: [id number] Date of Birth: [birth year]  Transition [facility] CM/SW Contact  Liliana Cline, LCSW Phone Number: 01/15/2021, 3:49 PM  Clinical Narrative:   Laretta Alstrom with [facility] of consult for OP Palliative.    Expected Discharge Plan: Skilled Nursing Facility Barriers to Discharge: Continued Medical Work up  Expected Discharge Plan and Services Expected Discharge Plan: Skilled Nursing Facility In-house Referral: Clinical Social Work   Post Acute Care Choice: Skilled Nursing Facility Living arrangements for the past 2 months: Single Family Home                                       Social Determinants of Health (SDOH) Interventions    Readmission Risk Interventions No flowsheet data found.

## 2021-01-15 NOTE — Progress Notes (Signed)
GI Inpatient Follow-up Note  Subjective:  Patient seen and doing well. Tolerating diet. No bleeding. Abdominal pain is improving.  Scheduled Inpatient Medications:   budesonide (PULMICORT) nebulizer solution  0.5 mg Nebulization BID   Chlorhexidine Gluconate Cloth  6 each Topical Daily   feeding supplement  237 mL Oral TID BM   ipratropium-albuterol  3 mL Nebulization TID   mouth rinse  15 mL Mouth Rinse 10 times per day   metoprolol tartrate  25 mg Oral BID   multivitamin with minerals  1 tablet Oral Daily   pantoprazole  40 mg Oral BID AC   sodium chloride flush  10-40 mL Intracatheter Q12H   vitamin B-12  1,000 mcg Oral Daily    Continuous Inpatient Infusions:    ampicillin-sulbactam (UNASYN) IV 3 g (01/15/21 1345)   anidulafungin 100 mg (01/15/21 1409)    PRN Inpatient Medications:  acetaminophen, albuterol, hydrALAZINE, loperamide, ondansetron (ZOFRAN) IV, oxyCODONE, sodium chloride flush  Review of Systems:  Review of Systems  Constitutional:  Positive for malaise/fatigue. Negative for chills and fever.  Respiratory:  Negative for cough.   Cardiovascular:  Negative for chest pain.  Gastrointestinal:  Positive for abdominal pain. Negative for blood in stool, constipation, diarrhea, melena, nausea and vomiting.  Neurological:  Negative for focal weakness.  Psychiatric/Behavioral:  Negative for substance abuse.   All other systems reviewed and are negative.   Physical Examination: BP (!) 126/99 (BP Location: Right Leg)   Pulse (!) 106   Temp 99.6 F (37.6 C) (Oral)   Resp 18   Ht 5' (1.524 m)   Wt 54.3 kg   SpO2 91%   BMI 23.38 kg/m  Gen: NAD, alert and oriented x 4 HEENT: PEERLA, EOMI, Neck: supple, no JVD or thyromegaly Chest: No respiratory distress CV: RRR, no m/g/c/r Abd: soft, mild tenderness Ext: no edema, well perfused with 2+ pulses, Skin: no rash or lesions noted Lymph: no LAD  Data: Lab Results  Component Value Date   WBC 12.4 (H)  01/15/2021   HGB 13.4 01/15/2021   HCT 41.9 01/15/2021   MCV 86.9 01/15/2021   PLT 238 01/15/2021   Recent Labs  Lab 01/13/21 0950 01/14/21 0611 01/15/21 0402  HGB 13.5 13.6 13.4    Lab Results  Component Value Date   NA 138 01/14/2021   K 3.7 01/15/2021   CL 107 01/14/2021   CO2 23 01/14/2021   BUN 9 01/14/2021   CREATININE 0.46 01/14/2021   Lab Results  Component Value Date   ALT 13 01/04/2021   AST 20 01/04/2021   ALKPHOS 82 01/04/2021   BILITOT 0.6 01/04/2021   Recent Labs  Lab 01/10/21 1307  APTT 57*  INR 1.9*    Assessment/Plan: Carolyn Herring is a 45 y.o. lady with history of hypertension and spina bifida s/p VP shunt with candida blood stream infection from large gastric ulcer s/p PEA arrest after EGD over weekend. Hemoglobin stable.  Recommendations:  - IV PPI BID while in hospital, PO BID at discharge - monitor hemoglobins - monitor for overt GI bleeding - nutrition and encourage protein shakes - supportive care per primary team - PT/OT - will need repeat EGD in 12 weeks as an outpatient   We will follow peripherally. Please contact on call GI doctor over the weekend if any questions or concerns.  Merlyn Lot MD, MPH [facility] GI

## 2021-01-15 NOTE — Progress Notes (Signed)
PHARMACY CONSULT NOTE - FOLLOW UP  Pharmacy Consult for Electrolyte Monitoring and Replacement   Recent Labs: Potassium (mmol/L)  Date Value  01/15/2021 3.7  02/11/2013 5.1   Magnesium (mg/dL)  Date Value  30/94/0768 2.1   Calcium (mg/dL)  Date Value  08/81/1031 7.9 (L)   Calcium, Total (mg/dL)  Date Value  59/45/8592 9.1   Albumin (g/dL)  Date Value  92/44/6286 1.9 (L)  02/11/2013 3.5   Phosphorus (mg/dL)  Date Value  38/17/7116 2.9   Sodium (mmol/L)  Date Value  01/14/2021 138  02/11/2013 138   Corr Ca: 9.58 mg/dL  Assessment: 45 year old female presented with recurrent falls, weakness and back pain. Patient with PMH HTN, hydrocephalus and spina bifida s/p VP shunt. Patient with candidemia (C.krusei) thought to be related to gastric ulcer and translocation from the gut. Shunt does not appear to be infected, TEE negative for endocarditis. Patient also with GIB s/p transfusions and EGD showing ulcer, bleeding appears to be resolving. Pharmacy consult for electrolyte management.    Goal of Therapy:  Electrolytes WNL  Plan:  --No replacement indicated at this time --Recheck with morning labs  Raiford Noble, PharmD Clinical Pharmacist 01/15/2021 7:17 AM

## 2021-01-15 NOTE — TOC Progression Note (Signed)
Transition of Care [facility]) - Progression Note    Patient Details  Name: Carolyn Herring MRN: [id number] Date of Birth: Feb 28, 1976  Transition of Care [facility]) CM/SW Contact  Chapman Fitch, RN Phone Number: 01/15/2021, 9:53 AM  Clinical Narrative:     VM left for Delorise Shiner at [facility] in order to accept bed and request insurance auth Extended bed search  Expected Discharge Plan: Skilled Nursing Facility Barriers to Discharge: Continued Medical Work up  Expected Discharge Plan and Services Expected Discharge Plan: Skilled Nursing Facility In-house Referral: Clinical Social Work   Post Acute Care Choice: Skilled Nursing Facility Living arrangements for the past 2 months: Single Family Home                                       Social Determinants of Health (SDOH) Interventions    Readmission Risk Interventions No flowsheet data found.

## 2021-01-15 NOTE — Progress Notes (Signed)
Physical Therapy Treatment Patient Details Name: Carolyn Herring MRN: [id number] DOB: 1975/09/08 Today's Date: 01/15/2021   History of Present Illness 45 y.o. F arriving 10/10 to ED after a fall with decreased responsiveness and hx of progressive recent abdominal pain and diarrhea.  Was found to have severe GI bleed.  Imaging also showing acute infarct.  Pt coded and required intubation Jan 30, 2023. 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    PT Comments    Pt seen later in pm, resting in bed agreed to B LE AA/AROM through all available planes. Limited B hip flexion due to c/o discomfort in knees. Pt also c/o tenderness in B Feet, yet unable to specify. Assisted pt in positioning in order to provide pressure relief and promote upright sitting. Discussed d/c plans with pt who stated she is very motivated to get back to walking again.  Will continue to work toward set goals next session.   Recommendations for follow up therapy are one component of a multi-disciplinary discharge planning process, led by the attending physician.  Recommendations may be updated based on patient status, additional functional criteria and insurance authorization.  Follow Up Recommendations  SNF;Supervision/Assistance - 24 hour     Equipment Recommendations   (TBD)    Recommendations for Other Services       Precautions / Restrictions Precautions Precautions: Fall Restrictions Weight Bearing Restrictions: No     Mobility  Bed Mobility Overal bed mobility: Needs Assistance Bed Mobility: Rolling Rolling: Mod assist              Transfers                    Ambulation/Gait                 Stairs             Wheelchair Mobility    Modified Rankin (Stroke Patients Only)       Balance                                            Cognition  Arousal/Alertness: Awake/alert Behavior During Therapy: WFL for tasks assessed/performed                                          Exercises General Exercises - Lower Extremity Ankle Circles/Pumps: AROM;Both;10 reps Quad Sets: 10 reps;Strengthening Short Arc Quad: AROM;AAROM;10 reps;Both Heel Slides: AAROM;Both;10 reps Hip ABduction/ADduction: AAROM;Both;10 reps Other Exercises Other Exercises: B hip gentle IR/ER with assist for increased ROM and decreased joint tightness.    General Comments General comments (skin integrity, edema, etc.): 94% on RA while completing LE strengthening exercises. Pt positioned in bed to promote skin integrity and reduce breakdown via support of pillows.      Pertinent Vitals/Pain Pain Assessment: Faces Pain Score: 5  Pain Location: ribs/chest (L>R) Pain Descriptors / Indicators: Grimacing;Discomfort Pain Intervention(s): Monitored during session;Limited activity within patient's tolerance    Home Living                      Prior Function            PT Goals (current  goals can now be found in the care plan section) Acute Rehab PT Goals Patient Stated Goal: get stronger back to walking    Frequency    7X/week      PT Plan Current plan remains appropriate    Co-evaluation              AM-PAC PT "6 Clicks" Mobility   Outcome Measure  Help needed turning from your back to your side while in a flat bed without using bedrails?: A Lot Help needed moving from lying on your back to sitting on the side of a flat bed without using bedrails?: A Lot Help needed moving to and from a bed to a chair (including a wheelchair)?: Total Help needed standing up from a chair using your arms (e.g., wheelchair or bedside chair)?: Total Help needed to walk in hospital room?: Total Help needed climbing 3-5 steps with a railing? : Total 6 Click Score: 8    End of Session   Activity Tolerance: Patient limited by  fatigue;Patient limited by pain Patient left: in bed;with call bell/phone within reach;with bed alarm set Nurse Communication: Mobility status PT Visit Diagnosis: Other abnormalities of gait and mobility (R26.89);Repeated falls (R29.6);History of falling (Z91.81);Muscle weakness (generalized) (M62.81)     Time: 0932-3557 PT Time Calculation (min) (ACUTE ONLY): 25 min  Charges:  $Therapeutic Exercise: 8-22 mins $Therapeutic Activity: 8-22 mins                    Zadie Cleverly, PTA  Jannet Askew 01/15/2021, 6:50 PM

## 2021-01-16 DIAGNOSIS — R578 Other shock: Secondary | ICD-10-CM | POA: Diagnosis not present

## 2021-01-16 LAB — BASIC METABOLIC PANEL
Anion gap: 5 (ref 5–15)
BUN: 11 mg/dL (ref 6–20)
CO2: 26 mmol/L (ref 22–32)
Calcium: 8 mg/dL — ABNORMAL LOW (ref 8.9–10.3)
Chloride: 110 mmol/L (ref 98–111)
Creatinine, Ser: 0.43 mg/dL — ABNORMAL LOW (ref 0.44–1.00)
GFR, Estimated: >60 mL/min (ref >60)
Glucose, Bld: 90 mg/dL (ref 70–99)
Potassium: 3.9 mmol/L (ref 3.5–5.1)
Sodium: 141 mmol/L (ref 135–145)

## 2021-01-16 LAB — CBC WITH DIFFERENTIAL/PLATELET
Abs Immature Granulocytes: 0.07 10*3/uL (ref 0.00–0.07)
Basophils Absolute: 0 10*3/uL (ref 0.0–0.1)
Basophils Relative: 0 %
Eosinophils Absolute: 0.4 10*3/uL (ref 0.0–0.5)
Eosinophils Relative: 5 %
HCT: 40.2 % (ref 36.0–46.0)
Hemoglobin: 13.2 g/dL (ref 12.0–15.0)
Immature Granulocytes: 1 %
Lymphocytes Relative: 11 %
Lymphs Abs: 1.1 10*3/uL (ref 0.7–4.0)
MCH: 29 pg (ref 26.0–34.0)
MCHC: 32.8 g/dL (ref 30.0–36.0)
MCV: 88.4 fL (ref 80.0–100.0)
Monocytes Absolute: 0.9 10*3/uL (ref 0.1–1.0)
Monocytes Relative: 10 %
Neutro Abs: 7 10*3/uL (ref 1.7–7.7)
Neutrophils Relative %: 73 %
Platelets: 259 10*3/uL (ref 150–400)
RBC: 4.55 MIL/uL (ref 3.87–5.11)
RDW: 17.1 % — ABNORMAL HIGH (ref 11.5–15.5)
WBC: 9.5 10*3/uL (ref 4.0–10.5)
nRBC: 0 % (ref 0.0–0.2)

## 2021-01-16 LAB — MAGNESIUM: Magnesium: 2 mg/dL (ref 1.7–2.4)

## 2021-01-16 LAB — PHOSPHORUS: Phosphorus: 3.2 mg/dL (ref 2.5–4.6)

## 2021-01-16 NOTE — Progress Notes (Signed)
Physical Therapy Treatment Patient Details Name: Carolyn Herring MRN: [id number] DOB: [birth year] Today's Date: 01/16/2021   History of Present Illness 45 y.o. F arriving 10/10 to ED after a fall with decreased responsiveness and hx of progressive recent abdominal pain and diarrhea.  Was found to have severe GI bleed.  Imaging also showing acute infarct.  Pt coded and required intubation 09-Feb-2023. 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    PT Comments    Pt was long sitting in bed upon arriving. She is A and cooperative throughout. Does need increased time to respond to all questions. She c/o rib pain however did not limit session progression. Session progressed to pt exiting R side of bed. Mod assist + increased time to perform. Stood 3 x from EOB with BUE supported by Chartered loss adjuster under axilla. Was able to tolerate standing x ~ 2 minutes each trial. Took several side steps from FOB to [facility] with assistance with lateral wt shift to allow LLE progression. Pt will greatly benefit from rehab at DC to address deficits while maximizing independence with ADLs.     Recommendations for follow up therapy are one component of a multi-disciplinary discharge planning process, led by the attending physician.  Recommendations may be updated based on patient status, additional functional criteria and insurance authorization.  Follow Up Recommendations  SNF;Supervision/Assistance - 24 hour     Equipment Recommendations  Other (comment) (defer to next level of care)       Precautions / Restrictions Precautions Precautions: Fall Restrictions Weight Bearing Restrictions: No     Mobility  Bed Mobility Overal bed mobility: Needs Assistance Bed Mobility: Rolling Rolling: Mod assist   Supine to sit: Mod assist (increased time + vcs throughout for technique, sequencing, and safety) Sit to supine: Max  assist   General bed mobility comments: mod assist to exit bed but max assist to return to supine after OOB activity    Transfers Overall transfer level: Needs assistance Equipment used:  (BUE support by author (axillary support)) Transfers: Sit to/from Stand Sit to Stand: Mod assist;From elevated surface         General transfer comment: mod assist to stand from elevated bed height 3 x. was able to advance to taking steps to [facility] from FOB. assisted with lateral wt shift to allow LLE advancement. was able to advance RLE without assistance with wt shift.  Ambulation/Gait Ambulation/Gait assistance: Min assist;Mod assist Gait Distance (Feet): 3 Feet Assistive device: None (axillary support) Gait Pattern/deviations: Step-to pattern Gait velocity: decreased   General Gait Details: pt took 3 side steps from FOB to [facility]    Balance Overall balance assessment: Needs assistance Sitting-balance support: Feet supported;Bilateral upper extremity supported Sitting balance-Leahy Scale: Fair Sitting balance - Comments: Once repositioned was able to maintain balance at EOB with SBA   Standing balance support: Bilateral upper extremity supported;During functional activity Standing balance-Leahy Scale: Poor      Cognition Arousal/Alertness: Awake/alert Behavior During Therapy: WFL for tasks assessed/performed Overall Cognitive Status: No family/caregiver present to determine baseline cognitive functioning      General Comments: Pt was A and O x 3. Needs increased time to respond however was a good historian of past medical situation         General Comments General comments (skin integrity, edema, etc.): vitals stable throughout      Pertinent Vitals/Pain Pain Assessment: 0-10 Pain Score: 5  Faces Pain Scale: Hurts little more Pain Location: ribs/chest (L>R) Pain Descriptors / Indicators: Grimacing;Discomfort Pain Intervention(s): Limited activity within patient's  tolerance;Monitored during session;Premedicated before session;Repositioned     PT Goals (current goals can now be found in the care plan section) Acute Rehab PT Goals Patient Stated Goal: get stronger back to walking Progress towards PT goals: Progressing toward goals    Frequency    7X/week      PT Plan Current plan remains appropriate       AM-PAC PT "6 Clicks" Mobility   Outcome Measure  Help needed turning from your back to your side while in a flat bed without using bedrails?: A Lot Help needed moving from lying on your back to sitting on the side of a flat bed without using bedrails?: A Lot Help needed moving to and from a bed to a chair (including a wheelchair)?: A Lot Help needed standing up from a chair using your arms (e.g., wheelchair or bedside chair)?: A Lot Help needed to walk in hospital room?: A Lot Help needed climbing 3-5 steps with a railing? : Total 6 Click Score: 11    End of Session   Activity Tolerance: Patient tolerated treatment well;Patient limited by fatigue Patient left: in bed;with call bell/phone within reach;with bed alarm set Nurse Communication: Mobility status PT Visit Diagnosis: Other abnormalities of gait and mobility (R26.89);Repeated falls (R29.6);History of falling (Z91.81);Muscle weakness (generalized) (M62.81)     Time: 1050-1120 PT Time Calculation (min) (ACUTE ONLY): 30 min  Charges:  $Therapeutic Activity: 23-37 mins                     Jetta Lout PTA 01/16/21, 12:51 PM

## 2021-01-16 NOTE — Progress Notes (Signed)
PHARMACY CONSULT NOTE - FOLLOW UP  Pharmacy Consult for Electrolyte Monitoring and Replacement   Recent Labs: Potassium (mmol/L)  Date Value  01/16/2021 3.9  02/11/2013 5.1   Magnesium (mg/dL)  Date Value  01/74/9449 2.0   Calcium (mg/dL)  Date Value  67/59/1638 8.0 (L)   Calcium, Total (mg/dL)  Date Value  46/65/9935 9.1   Albumin (g/dL)  Date Value  70/17/7939 1.9 (L)  02/11/2013 3.5   Phosphorus (mg/dL)  Date Value  03/00/9233 3.2   Sodium (mmol/L)  Date Value  01/16/2021 141  02/11/2013 138   Corr Ca: 9.7 mg/dL  Assessment: 45 year old female presented with recurrent falls, weakness and back pain. Patient with PMH HTN, hydrocephalus and spina bifida s/p VP shunt. Patient with candidemia (C.krusei) thought to be related to gastric ulcer and translocation from the gut. Shunt does not appear to be infected, TEE negative for endocarditis. Patient also with GIB s/p transfusions and EGD showing ulcer, bleeding appears to be resolving. Pharmacy consult for electrolyte management.    Goal of Therapy:  Electrolytes WNL  Plan:  --No replacement indicated at this time --Recheck with morning labs  Angelique Blonder, PharmD Clinical Pharmacist 01/16/2021 1:39 PM

## 2021-01-16 NOTE — Progress Notes (Signed)
[facility]  - [place] at [facility]   PATIENT NAME: Carolyn Herring    MR#:  [id number]  DATE OF BIRTH:  10-07-1975  SUBJECTIVE:   patient communicative. She is moving all her extremities well.   No family at bedside. Eating better  by herself. C/o rib pain from the code. Encouraged her to work with physical therapy.  REVIEW OF SYSTEMS:   Review of Systems  Constitutional:  Negative for chills, fever and weight loss.  HENT:  Negative for ear discharge, ear pain and nosebleeds.   Eyes:  Negative for blurred vision, pain and discharge.  Respiratory:  Negative for sputum production, shortness of breath, wheezing and stridor.   Cardiovascular:  Negative for chest pain, palpitations, orthopnea and PND.  Gastrointestinal:  Negative for abdominal pain, diarrhea, nausea and vomiting.  Genitourinary:  Negative for frequency and urgency.  Musculoskeletal:  Negative for back pain and joint pain.  Neurological:  Negative for sensory change, speech change, focal weakness and weakness.  Psychiatric/Behavioral:  Negative for depression and hallucinations. The patient is not nervous/anxious.   Tolerating Diet:yes Tolerating PT: SNF  DRUG ALLERGIES:   Allergies  Allergen Reactions   Tape Dermatitis    **BLISTERS** **BLISTERS**    Codeine Itching   Hydrogen Peroxide Other (See Comments)    Skin Irritation   Latex Other (See Comments)    Skin Irritation; blisters     VITALS:  Blood pressure (!) 145/92, pulse (!) 103, temperature 98.1 F (36.7 C), temperature source Oral, resp. rate 12, height 5' (1.524 m), weight 54.3 kg, SpO2 96 %.  PHYSICAL EXAMINATION:   Physical Exam  GENERAL:  45 y.o.-year-old patient lying in the bed with no acute distress.  HEENT: Head atraumatic, normocephalic. Oropharynx and nasopharynx clear.  LUNGS: Normal breath sounds bilaterally, no wheezing, rales, rhonchi. No use of accessory muscles of respiration.  CARDIOVASCULAR: S1, S2 normal.  No murmurs, rubs, or gallops.  ABDOMEN: Soft, nontender, nondistended. Bowel sounds present. No organomegaly or mass.  EXTREMITIES: chronic contractures +  NEUROLOGIC: left hand contracted moves all extremities PSYCHIATRIC:  patient is alert and oriented x 3.  SKIN:   Pressure Injury 01/06/21 Ankle Anterior;Right Stage 2 -  Partial thickness loss of dermis presenting as a shallow open injury with a red, pink wound bed without slough. (Active)  01/06/21 2200  Location: Ankle  Location Orientation: Anterior;Right  Staging: Stage 2 -  Partial thickness loss of dermis presenting as a shallow open injury with a red, pink wound bed without slough.  Wound Description (Comments):   Present on Admission:     LABORATORY PANEL:  CBC Recent Labs  Lab 01/16/21 0430  WBC 9.5  HGB 13.2  HCT 40.2  PLT 259     Chemistries  Recent Labs  Lab 01/16/21 0430  NA 141  K 3.9  CL 110  CO2 26  GLUCOSE 90  BUN 11  CREATININE 0.43*  CALCIUM 8.0*  MG 2.0    Cardiac Enzymes No results for input(s): TROPONINI in the last 168 hours. RADIOLOGY:  No results found. ASSESSMENT AND PLAN:   45 yo female with a PMH of HTN, Hydrocephalus, and 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 last revised in 2019.  She has not seen a neurosurgeon in 3 yrs.  She presented to [facility] ER on 10/10 via EMS from home with recurrent falls, generalized weakness, and back pain.  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 - Aggressive iv fluid resuscitation to maintain map >65  - Transfuse for hgb <7 - Continue IV protonix--change to po today - Avoid all NSAID's  - General Surgery and GI consulted appreciate input --pt will  need EGD in few weeks as out pt -- tolerating PO diet   Hypokalemia~resolved  - Replace electrolytes as indicated    Leukocytosis secondary to aspiration pneumonia  Blood cultures 10/10: candida krusei anaerobic bottle only - Trend WBC and monitor fever curve  - Continue unasyn (10/22) and Eraxis (till 10/25)    Recurrent encephalopathy post arrest ~MR Brain 10/18 revealed 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 No midline shift. Inflammatory paranasal sinus and mastoid disease, new since prior MRI. - Supportive care - Avoid sedatives as able - PT/OT consulted appreciate input  - Neurology consulted appreciate input --NO Asa due to Gi bleed -patient needs extensive rehab.  Family communication : brother Lance Coon discussed rehab option and he is agreeable with the plan Consults : ID, neurology, G.I. CODE STATUS: full DVT Prophylaxis :SCD Level of care: Med-Surg Status is: Inpatient  continue PT OT. TOC for discharge planning to rehab likely Monday if continues to show improvement. Patient has a bed at [facility]   TOTAL TIME TAKING CARE OF THIS PATIENT: 25 minutes.  >50% time spent on counselling and coordination of care  Note: This dictation was prepared with Dragon dictation along with smaller phrase technology. Any transcriptional errors that result from this process are unintentional.  Enedina Finner M.D    [facility]   CC: Primary care physician; Patient, No Pcp Per (Inactive) Patient ID: HAZLE OGBURN, female    DOB: 1975/06/30, 45 y.o.   MRN: [id number]

## 2021-01-17 DIAGNOSIS — R578 Other shock: Secondary | ICD-10-CM | POA: Diagnosis not present

## 2021-01-17 DIAGNOSIS — J9601 Acute respiratory failure with hypoxia: Secondary | ICD-10-CM | POA: Diagnosis not present

## 2021-01-17 DIAGNOSIS — I469 Cardiac arrest, cause unspecified: Secondary | ICD-10-CM | POA: Diagnosis not present

## 2021-01-17 DIAGNOSIS — B49 Unspecified mycosis: Secondary | ICD-10-CM | POA: Diagnosis not present

## 2021-01-17 LAB — POTASSIUM: Potassium: 3.8 mmol/L (ref 3.5–5.1)

## 2021-01-17 LAB — RESP PANEL BY RT-PCR (FLU A&B, COVID) ARPGX2
Influenza A by PCR: NEGATIVE
Influenza B by PCR: NEGATIVE
SARS Coronavirus 2 by RT PCR: NEGATIVE

## 2021-01-17 LAB — MAGNESIUM: Magnesium: 2 mg/dL (ref 1.7–2.4)

## 2021-01-17 MED ORDER — ORAL CARE MOUTH RINSE
15.0000 mL | Freq: Two times a day (BID) | OROMUCOSAL | Status: DC
  Administered 2021-01-17 – 2021-01-18 (×2): 15 mL via OROMUCOSAL

## 2021-01-17 NOTE — TOC Progression Note (Signed)
Transition of Care [facility]) - Progression Note    Patient Details  Name: Carolyn Herring MRN: [id number] Date of Birth: Dec 16, 1975  Transition of Care [facility] CM/SW Contact  Liliana Cline, LCSW Phone Number: 01/17/2021, 1:13 PM  Clinical Narrative:   MD to order COVID test in preparation for DC to [facility] tomorrow.    Expected Discharge Plan: Skilled Nursing Facility Barriers to Discharge: Continued Medical Work up  Expected Discharge Plan and Services Expected Discharge Plan: Skilled Nursing Facility In-house Referral: Clinical Social Work   Post Acute Care Choice: Skilled Nursing Facility Living arrangements for the past 2 months: Single Family Home                                       Social Determinants of Health (SDOH) Interventions    Readmission Risk Interventions No flowsheet data found.

## 2021-01-17 NOTE — Progress Notes (Signed)
[facility]  - [place] at [facility]   PATIENT NAME: Carolyn Herring    MR#:  [id number]  DATE OF BIRTH:  1975/12/15  SUBJECTIVE:   patient communicative.   No family at bedside.gave her the book to do art work REVIEW OF SYSTEMS:   Review of Systems  Constitutional:  Negative for chills, fever and weight loss.  HENT:  Negative for ear discharge, ear pain and nosebleeds.   Eyes:  Negative for blurred vision, pain and discharge.  Respiratory:  Negative for sputum production, shortness of breath, wheezing and stridor.   Cardiovascular:  Negative for chest pain, palpitations, orthopnea and PND.  Gastrointestinal:  Negative for abdominal pain, diarrhea, nausea and vomiting.  Genitourinary:  Negative for frequency and urgency.  Musculoskeletal:  Negative for back pain and joint pain.  Neurological:  Negative for sensory change, speech change, focal weakness and weakness.  Psychiatric/Behavioral:  Negative for depression and hallucinations. The patient is not nervous/anxious.   Tolerating Diet:yes Tolerating PT: SNF  DRUG ALLERGIES:   Allergies  Allergen Reactions   Tape Dermatitis    **BLISTERS** **BLISTERS**    Codeine Itching   Hydrogen Peroxide Other (See Comments)    Skin Irritation   Latex Other (See Comments)    Skin Irritation; blisters     VITALS:  Blood pressure (!) 145/80, pulse 99, temperature 98.8 F (37.1 C), temperature source Oral, resp. rate 16, height 5' (1.524 m), weight 55 kg, SpO2 98 %.  PHYSICAL EXAMINATION:   Physical Exam  GENERAL:  45 y.o.-year-old patient lying in the bed with no acute distress.  HEENT: Head atraumatic, normocephalic. Oropharynx and nasopharynx clear.  LUNGS: Normal breath sounds bilaterally, no wheezing, rales, rhonchi. No use of accessory muscles of respiration.  CARDIOVASCULAR: S1, S2 normal. No murmurs, rubs, or gallops.  ABDOMEN: Soft Bowel sounds present.  EXTREMITIES: chronic contractures +   NEUROLOGIC: left hand contracted moves all extremities PSYCHIATRIC:  patient is alert and oriented x 3.  SKIN:   Pressure Injury 01/06/21 Ankle Anterior;Right Stage 2 -  Partial thickness loss of dermis presenting as a shallow open injury with a red, pink wound bed without slough. (Active)  01/06/21 2200  Location: Ankle  Location Orientation: Anterior;Right  Staging: Stage 2 -  Partial thickness loss of dermis presenting as a shallow open injury with a red, pink wound bed without slough.  Wound Description (Comments):   Present on Admission:     LABORATORY PANEL:  CBC Recent Labs  Lab 01/16/21 0430  WBC 9.5  HGB 13.2  HCT 40.2  PLT 259     Chemistries  Recent Labs  Lab 01/16/21 0430 01/17/21 0550  NA 141  --   K 3.9 3.8  CL 110  --   CO2 26  --   GLUCOSE 90  --   BUN 11  --   CREATININE 0.43*  --   CALCIUM 8.0*  --   MG 2.0 2.0    Cardiac Enzymes No results for input(s): TROPONINI in the last 168 hours. RADIOLOGY:  No results found. ASSESSMENT AND PLAN:   45 yo female with a PMH of HTN, Hydrocephalus, and 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 last revised in 2019.  She has not seen a neurosurgeon in 3 yrs.  She presented to [facility] ER on 10/10 via EMS from home with recurrent falls, generalized weakness, and back pain.  Micro Respiratory Panel by RT-PCR 10/10>>negative  Blood 10/10>>candida krusei anaerobic bottle  only Urine 10/10>><10,000 colonies/ml insignificant growth  GI Panel 10/11>>negative  C diff 10/11>>positive antigen/negative toxin/pcr negative CSF 10/13>>negative  Fungal culture 10/13>>  Acute respiratory failure with hypoxia and hypercarbia due to PEA arrest Aspiration pneumonia (blood aspirated) - Prn supplemental O2 sats for dyspnea and/or hypoxia  - Titrate O2 to keep saturations at 92% or better - Bronchodilators as needed Wean to RA   S/P PEA arrest D/T Hemorrhagic shock from bleeding giant gastric  ulcer - Aggressive iv fluid resuscitation to maintain map >65  - Transfuse for hgb <7 -  IV protonix--changed to po bid - Avoid all NSAID's  - General Surgery and GI consulted appreciate input --pt will  need EGD in few weeks as out pt -- tolerating PO diet   Hypokalemia~resolved  - Replace electrolytes as indicated    Leukocytosis secondary to aspiration pneumonia  Blood cultures 10/10: candida krusei anaerobic bottle only - Trend WBC and monitor fever curve  - Continue unasyn (completed 10/22) and Eraxis (till 10/25)    Recurrent encephalopathy post arrest ~MR Brain 10/18 revealed 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 No midline shift. Inflammatory paranasal sinus and mastoid disease, new since prior MRI. - Supportive care - Avoid sedatives as able - PT/OT consulted appreciate input  - Neurology consulted appreciate input --No Asa due to Gi bleed -patient needs extensive rehab.  Family communication : brother Lance Coon discussed rehab option and he is agreeable with the plan 10/20 Consults : ID, neurology, G.I. CODE STATUS: full DVT Prophylaxis :SCD Level of care: Med-Surg Status is: Inpatient  continue PT OT. TOC for discharge planning to rehab likely Monday if continues to show improvement. Patient has a bed at [facility]   TOTAL TIME TAKING CARE OF THIS PATIENT: 25 minutes.  >50% time spent on counselling and coordination of care  Note: This dictation was prepared with Dragon dictation along with smaller phrase technology. Any transcriptional errors that result from this process are unintentional.  Enedina Finner M.D    [facility]   CC: Primary care physician; Patient, No Pcp Per (Inactive) Patient ID: ANASTAZJA ISAAC, female   DOB: May 10, 1975, 45 y.o.   MRN: [id number]

## 2021-01-17 NOTE — Progress Notes (Signed)
Physical Therapy Treatment Patient Details Name: Carolyn Herring MRN: [id number] DOB: [birth year] Today's Date: 01/17/2021   History of Present Illness 45 y.o. F arriving 10/10 to ED after a fall with decreased responsiveness and hx of progressive recent abdominal pain and diarrhea.  Was found to have severe GI bleed.  Imaging also showing acute infarct.  Pt coded and required intubation 02-Feb-2023. 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    PT Comments    To EOB with mod a x 1 and reported increased discomfort today with transition.  RN in during session to give pain medication.  She does remain sitting for 15 minutes with LE AROM.  Standing/steps deferred due to increased discomfort.   Recommendations for follow up therapy are one component of a multi-disciplinary discharge planning process, led by the attending physician.  Recommendations may be updated based on patient status, additional functional criteria and insurance authorization.  Follow Up Recommendations  SNF;Supervision/Assistance - 24 hour     Equipment Recommendations       Recommendations for Other Services       Precautions / Restrictions Precautions Precautions: Fall Restrictions Weight Bearing Restrictions: No     Mobility  Bed Mobility Overal bed mobility: Needs Assistance       Supine to sit: Mod assist Sit to supine: Max assist   General bed mobility comments: mod assist to exit bed but max assist to return to supine after OOB activity    Transfers                 General transfer comment: deferred due to increased discomfort today.  Ambulation/Gait                 Stairs             Wheelchair Mobility    Modified Rankin (Stroke Patients Only)       Balance Overall balance assessment: Needs assistance Sitting-balance support: Feet supported;Bilateral  upper extremity supported Sitting balance-Leahy Scale: Fair Sitting balance - Comments: Once repositioned was able to maintain balance at EOB with SBA                                    Cognition Arousal/Alertness: Awake/alert Behavior During Therapy: WFL for tasks assessed/performed Overall Cognitive Status: No family/caregiver present to determine baseline cognitive functioning                               Problem Solving: Slow processing;Decreased initiation;Difficulty sequencing;Requires verbal cues;Requires tactile cues General Comments: Pt was A and O x 3. Needs increased time to respond however was a good historian of past medical situation      Exercises Other Exercises Other Exercises: seate AROM BLE    General Comments        Pertinent Vitals/Pain Pain Assessment: Faces Faces Pain Scale: Hurts even more Pain Location: ribs/chest (L>R) Pain Descriptors / Indicators: Grimacing;Discomfort Pain Intervention(s): Limited activity within patient's tolerance;Monitored during session;RN gave pain meds during session;Repositioned    Home Living                      Prior Function            PT Goals (current goals can now be found  in the care plan section) Progress towards PT goals: Progressing toward goals    Frequency    7X/week      PT Plan Current plan remains appropriate    Co-evaluation              AM-PAC PT "6 Clicks" Mobility   Outcome Measure  Help needed turning from your back to your side while in a flat bed without using bedrails?: A Lot Help needed moving from lying on your back to sitting on the side of a flat bed without using bedrails?: A Lot Help needed moving to and from a bed to a chair (including a wheelchair)?: A Lot Help needed standing up from a chair using your arms (e.g., wheelchair or bedside chair)?: A Lot Help needed to walk in hospital room?: A Lot Help needed climbing 3-5 steps with a  railing? : Total 6 Click Score: 11    End of Session   Activity Tolerance: Patient limited by fatigue;Patient limited by pain Patient left: in bed;with call bell/phone within reach;with bed alarm set;with nursing/sitter in room Nurse Communication: Mobility status PT Visit Diagnosis: Other abnormalities of gait and mobility (R26.89);Repeated falls (R29.6);History of falling (Z91.81);Muscle weakness (generalized) (M62.81)     Time: 4917-9150 PT Time Calculation (min) (ACUTE ONLY): 23 min  Charges:  $Therapeutic Exercise: 8-22 mins $Therapeutic Activity: 8-22 mins                    Danielle Dess, PTA 01/17/21, 12:10 PM

## 2021-01-17 NOTE — Progress Notes (Signed)
PHARMACY CONSULT NOTE - FOLLOW UP  Pharmacy Consult for Electrolyte Monitoring and Replacement   Recent Labs: Potassium (mmol/L)  Date Value  01/17/2021 3.8  02/11/2013 5.1   Magnesium (mg/dL)  Date Value  66/44/0347 2.0   Calcium (mg/dL)  Date Value  42/59/5638 8.0 (L)   Calcium, Total (mg/dL)  Date Value  75/64/3329 9.1   Albumin (g/dL)  Date Value  51/88/4166 1.9 (L)  02/11/2013 3.5   Phosphorus (mg/dL)  Date Value  09/25/1599 3.2   Sodium (mmol/L)  Date Value  01/16/2021 141  02/11/2013 138   Corr Ca: 9.7 mg/dL  Assessment: 45 year old female presented with recurrent falls, weakness and back pain. Patient with PMH HTN, hydrocephalus and spina bifida s/p VP shunt. Patient with candidemia (C.krusei) thought to be related to gastric ulcer and translocation from the gut. Shunt does not appear to be infected, TEE negative for endocarditis. Patient also with GIB s/p transfusions and EGD showing ulcer, bleeding appears to be resolving. Pharmacy consult for electrolyte management.    Goal of Therapy:  Electrolytes WNL  Plan:  --No replacement indicated at this time --Recheck with morning labs  Angelique Blonder, PharmD Clinical Pharmacist 01/17/2021 1:29 PM

## 2021-01-18 DIAGNOSIS — J9601 Acute respiratory failure with hypoxia: Secondary | ICD-10-CM | POA: Diagnosis not present

## 2021-01-18 DIAGNOSIS — I469 Cardiac arrest, cause unspecified: Secondary | ICD-10-CM | POA: Diagnosis not present

## 2021-01-18 DIAGNOSIS — B49 Unspecified mycosis: Secondary | ICD-10-CM | POA: Diagnosis not present

## 2021-01-18 DIAGNOSIS — R578 Other shock: Secondary | ICD-10-CM | POA: Diagnosis not present

## 2021-01-18 LAB — METHYLMALONIC ACID, SERUM: Methylmalonic Acid, Quantitative: 246 nmol/L (ref 0–378)

## 2021-01-18 MED ORDER — OXYCODONE HCL 5 MG PO TABS: 5.0000 mg | ORAL_TABLET | Freq: Four times a day (QID) | ORAL | 0 refills | Status: AC | PRN

## 2021-01-18 MED ORDER — METOPROLOL TARTRATE 25 MG PO TABS: 25.0000 mg | ORAL_TABLET | Freq: Two times a day (BID) | ORAL | 2 refills | Status: AC

## 2021-01-18 MED ORDER — CYANOCOBALAMIN 1000 MCG PO TABS: 1000.0000 ug | ORAL_TABLET | Freq: Every day | ORAL | 0 refills | Status: AC

## 2021-01-18 MED ORDER — CHLORHEXIDINE GLUCONATE CLOTH 2 % EX PADS
6.0000 | MEDICATED_PAD | Freq: Every day | CUTANEOUS | Status: DC
  Administered 2021-01-18: 6 via TOPICAL

## 2021-01-18 MED ORDER — LOPERAMIDE HCL 2 MG PO CAPS: 2.0000 mg | ORAL_CAPSULE | ORAL | 0 refills | Status: AC | PRN

## 2021-01-18 MED ORDER — PANTOPRAZOLE SODIUM 40 MG PO TBEC: 40.0000 mg | DELAYED_RELEASE_TABLET | Freq: Two times a day (BID) | ORAL | 2 refills | Status: AC

## 2021-01-18 MED ORDER — ADULT MULTIVITAMIN W/MINERALS CH: 1.0000 | ORAL_TABLET | Freq: Every day | ORAL | 0 refills | Status: AC

## 2021-01-18 MED ORDER — ENSURE ENLIVE PO LIQD: 237.0000 mL | Freq: Three times a day (TID) | ORAL | 12 refills | Status: AC

## 2021-01-18 NOTE — Progress Notes (Signed)
Patient ID: Carolyn Herring, female   DOB: [birth year], 45 y.o.   MRN: [id number]  Spoke with brother clinton and he is agreeable with [facility] and HE KNOWS THERE IS NO OTHER OPTION AT PRESENT

## 2021-01-18 NOTE — Discharge Summary (Signed)
[facility]   PATIENT NAME: Carolyn Herring    MR#:  [id number]  DATE OF BIRTH:  Nov 21, 1975  DATE OF ADMISSION:  01/04/2021 ADMITTING PHYSICIAN: Arnetha Courser, MD  DATE OF DISCHARGE: 01/18/2021  PRIMARY CARE PHYSICIAN: Patient, No Pcp Per (Inactive)    ADMISSION DIAGNOSIS:  Hemorrhagic shock (HCC) [R57.8] Fall [W19.XXXA] Altered mental status, unspecified altered mental status type [R41.82]  DISCHARGE DIAGNOSIS:  acute respiratory failure with hypoxia and hypercapnia due to PEA arrest and aspiration pneumonia improved hemorrhagic shock secondary to G.I. 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  She has not seen a neurosurgeon in 3 yrs.  She presented to [facility] ER on 10/10 via EMS from home with recurrent falls, generalized weakness, and back pain.   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 - Aggressive iv fluid resuscitation to maintain map >65  - Transfuse for hgb <7 -  IV  protonix--changed to po bid - Avoid all NSAID's  - General Surgery and GI consulted appreciate input --pt will  need EGD in few weeks as out pt -- tolerating PO diet   Hypokalemia~resolved  - Replace electrolytes as indicated    Leukocytosis secondary to aspiration pneumonia  Blood cultures 10/10: candida krusei anaerobic bottle only - Trend WBC and monitor fever curve  - Completed unasyn  and Eraxis --d/w Dr fitzgerald    Recurrent encephalopathy post arrest ~MR Brain 10/18 revealed 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 No midline shift. Inflammatory paranasal sinus and mastoid disease, new since prior MRI. - Supportive care - Avoid sedatives as able - PT/OT consulted appreciate input  - Neurology consulted appreciate input --No Asa due to Gi bleed -patient needs extensive rehab.   Family communication : brother Lance Coon discussed rehab option and he is agreeable with the plan 10/20 Consults : ID, neurology, G.I. CODE STATUS: full DVT Prophylaxis :SCD Level of care: Med-Surg Status is: Inpatient  patient overall improved significantly. She will be discharged to [facility] for rehab. Patient is in agreement.   CONSULTS OBTAINED:  Treatment Team:  Gordy Councilman, MD  DRUG ALLERGIES:   Allergies  Allergen Reactions   Tape Dermatitis    **BLISTERS** **BLISTERS**    Codeine Itching   Hydrogen Peroxide Other (See Comments)    Skin Irritation   Latex Other (See Comments)    Skin Irritation; blisters     DISCHARGE MEDICATIONS:   Allergies as of 01/18/2021  Reactions   Tape Dermatitis   **BLISTERS** **BLISTERS**   Codeine Itching   Hydrogen Peroxide Other (See Comments)   Skin Irritation   Latex Other (See Comments)   Skin Irritation; blisters        Medication List     STOP taking these medications     diphenhydrAMINE 25 MG tablet Commonly known as: BENADRYL   ibuprofen 200 MG tablet Commonly known as: ADVIL   ibuprofen 600 MG tablet Commonly known as: ADVIL       TAKE these medications    cyanocobalamin 1000 MCG tablet Take 1 tablet (1,000 mcg total) by mouth daily.   feeding supplement Liqd Take 237 mLs by mouth 3 (three) times daily between meals.   loperamide 2 MG capsule Commonly known as: IMODIUM Take 1 capsule (2 mg total) by mouth as needed for diarrhea or loose stools.   metoprolol tartrate 25 MG tablet Commonly known as: LOPRESSOR Take 1 tablet (25 mg total) by mouth 2 (two) times daily.   multivitamin with minerals Tabs tablet Take 1 tablet by mouth daily.   oxyCODONE 5 MG immediate release tablet Commonly known as: Oxy IR/ROXICODONE Take 1 tablet (5 mg total) by mouth every 6 (six) hours as needed for severe pain.   pantoprazole 40 MG tablet Commonly known as: PROTONIX Take 1 tablet (40 mg total) by mouth 2 (two) times daily before a meal.        If you experience worsening of your admission symptoms, develop shortness of breath, life threatening emergency, suicidal or homicidal thoughts you must seek medical attention immediately by calling 911 or calling your MD immediately  if symptoms less severe.  You Must read complete instructions/literature along with all the possible adverse reactions/side effects for all the Medicines you take and that have been prescribed to you. Take any new Medicines after you have completely understood and accept all the possible adverse reactions/side effects.   Please note  You were cared for by a hospitalist during your hospital stay. If you have any questions about your discharge medications or the care you received while you were in the hospital after you are discharged, you can call the unit and asked to speak with the hospitalist on call if the hospitalist that took care of you is not available. Once you are  discharged, your primary care physician will handle any further medical issues. Please note that NO REFILLS for any discharge medications will be authorized once you are discharged, as it is imperative that you return to your primary care physician (or establish a relationship with a primary care physician if you do not have one) for your aftercare needs so that they can reassess your need for medications and monitor your lab values. Today   SUBJECTIVE  No new complaints   VITAL SIGNS:  Blood pressure 121/89, pulse 93, temperature 98.1 F (36.7 C), temperature source Oral, resp. rate 16, height 5' (1.524 m), weight 53.1 kg, SpO2 98 %.  I/O:   Intake/Output Summary (Last 24 hours) at 01/18/2021 0918 Last data filed at 01/18/2021 7619 Gross per 24 hour  Intake 10 ml  Output 1600 ml  Net -1590 ml    PHYSICAL EXAMINATION:  GENERAL:  45 y.o.-year-old patient lying in the bed with no acute distress.  HEENT: Head atraumatic, normocephalic. Oropharynx and nasopharynx clear.  LUNGS: Normal breath sounds bilaterally, no wheezing, rales, rhonchi. No use of accessory muscles of respiration.  CARDIOVASCULAR: S1, S2 normal. No murmurs, rubs, or gallops.  ABDOMEN: Soft Bowel sounds present.  EXTREMITIES: chronic contractures +  NEUROLOGIC: left hand contracted moves all extremities PSYCHIATRIC:  patient is alert and oriented x 3.  SKIN:   Pressure Injury 01/06/21 Ankle Anterior;Right Stage 2 -  Partial thickness loss of dermis presenting as a shallow open injury with a red, pink wound bed without slough. (Active)  01/06/21 2200  Location: Ankle  Location Orientation: Anterior;Right  Staging: Stage 2 -  Partial thickness loss of dermis presenting as a shallow open injury with a red, pink wound bed without slough.  Wound Description (Comments):   Present on Admission:     DATA REVIEW:   CBC  Recent Labs  Lab 01/16/21 0430  WBC 9.5  HGB 13.2  HCT 40.2  PLT 259    Chemistries  Recent  Labs  Lab 01/16/21 0430 01/17/21 0550  NA 141  --   K 3.9 3.8  CL 110  --   CO2 26  --   GLUCOSE 90  --   BUN 11  --   CREATININE 0.43*  --   CALCIUM 8.0*  --   MG 2.0 2.0    Microbiology Results   Recent Results (from the past 240 hour(s))  Resp Panel by RT-PCR (Flu A&B, Covid) Nasopharyngeal Swab     Status: None   Collection Time: 01/17/21  1:53 PM   Specimen: Nasopharyngeal Swab; Nasopharyngeal(NP) swabs in vial transport medium  Result Value Ref Range Status   SARS Coronavirus 2 by RT PCR NEGATIVE NEGATIVE Final    Comment: (NOTE) SARS-CoV-2 target nucleic acids are NOT DETECTED.  The SARS-CoV-2 RNA is generally detectable in upper respiratory specimens during the acute phase of infection. The lowest concentration of SARS-CoV-2 viral copies this assay can detect is 138 copies/mL. A negative result does not preclude SARS-Cov-2 infection and should not be used as the sole basis for treatment or other patient management decisions. A negative result may occur with  improper specimen collection/handling, submission of specimen other than nasopharyngeal swab, presence of viral mutation(s) within the areas targeted by this assay, and inadequate number of viral copies(<138 copies/mL). A negative result must be combined with clinical observations, patient history, and epidemiological information. The expected result is Negative.  Fact Sheet for Patients:  BloggerCourse.com  Fact Sheet for Healthcare Providers:  SeriousBroker.it  This test is no t yet approved or cleared by the [place] FDA and  has been authorized for detection and/or diagnosis of SARS-CoV-2 by FDA under an Emergency Use Authorization (EUA). This EUA will remain  in effect (meaning this test can be used) for the duration of the COVID-19 declaration under Section 564(b)(1) of the Act, 21 U.S.C.section 360bbb-3(b)(1), unless the authorization is  terminated  or revoked sooner.       Influenza A by PCR NEGATIVE NEGATIVE Final   Influenza B by PCR NEGATIVE NEGATIVE Final    Comment: (NOTE) The Xpert Xpress SARS-CoV-2/FLU/RSV plus assay is intended as an aid in the diagnosis of influenza from Nasopharyngeal swab specimens and should not be used as a sole basis for treatment. Nasal washings and aspirates are unacceptable for Xpert Xpress SARS-CoV-2/FLU/RSV testing.  Fact Sheet for Patients: BloggerCourse.com  Fact Sheet for Healthcare Providers: SeriousBroker.it  This test is not yet approved or cleared by the [place] FDA and has been authorized for detection and/or diagnosis of SARS-CoV-2 by FDA under an Emergency Use Authorization (EUA). This EUA will remain in effect (meaning this test can be used) for the duration  of the COVID-19 declaration under Section 564(b)(1) of the Act, 21 U.S.C. section 360bbb-3(b)(1), unless the authorization is terminated or revoked.  Performed at [facility], [address], [place], [place] [address]     RADIOLOGY:  No results found.   CODE STATUS:     Code Status Orders  (From admission, onward)           Start     Ordered   01/07/21 1432  Full code  Continuous        01/07/21 1431           Code Status History     Date Active Date Inactive Code Status Order ID Comments User Context   01/04/2021 1538 01/07/2021 1431 Full Code [id number]  Lianne Cure, NP ED        TOTAL TIME TAKING CARE OF THIS PATIENT: 40 minutes.    Enedina Finner M.D  [facility]    CC: Primary care physician; Patient, No Pcp Per (Inactive)

## 2021-01-18 NOTE — Progress Notes (Signed)
   01/18/21 1000  Clinical Encounter Type  Visited With Patient  Visit Type Follow-up;Spiritual support  Referral From Chaplain  Consult/Referral To Chaplain  Spiritual Encounters  Spiritual Needs Prayer;Emotional  Chaplain Oleta Mouse did a FU visited for Pt in 227C. Provided prayer and emotional support

## 2021-01-18 NOTE — TOC Transition Note (Signed)
Transition of Care [facility]) - CM/SW Discharge Note   Patient Details  Name: Carolyn Herring MRN: [id number] Date of Birth: [birth year]  [facility]) CM/SW Contact:  Maree Krabbe, LCSW Phone Number: 01/18/2021, 1:00 PM   Clinical Narrative:    Clinical Social Worker facilitated patient discharge including contacting patient family and facility to confirm patient discharge plans.  Clinical information faxed to facility and family agreeable with plan.  CSW arranged ambulance transport via [facility] to [facility] (room 124A) .  RN to call [phone] for report prior to discharge.    Final next level of care: Skilled Nursing Facility Barriers to Discharge: Continued Medical Work up   Patient Goals and CMS Choice Patient states their goals for this hospitalization and ongoing recovery are:: To get back home as soon as possible   Choice offered to / list presented to : Patient  Discharge Placement              Patient chooses bed at:  [facility]) Patient to be transferred to facility by: [facility] Name of family member notified: Renea Ee Patient and family notified of of transfer: 01/18/21  Discharge Plan and Services In-house Referral: Clinical Social Work   Post Acute Care Choice: Skilled Nursing Facility                               Social Determinants of Health (SDOH) Interventions     Readmission Risk Interventions No flowsheet data found.

## 2021-01-21 LAB — CULTURE, BLOOD (SINGLE): Special Requests: ADEQUATE

## 2021-02-04 ENCOUNTER — Non-Acute Institutional Stay: Payer: Self-pay

## 2021-02-04 VITALS — BP 110/60 | HR 88 | Resp 18

## 2021-02-04 DIAGNOSIS — Z515 Encounter for palliative care: Secondary | ICD-10-CM

## 2021-02-04 LAB — FUNGUS CULTURE RESULT

## 2021-02-04 LAB — FUNGUS CULTURE WITH STAIN

## 2021-02-04 LAB — FUNGAL ORGANISM REFLEX

## 2021-02-08 ENCOUNTER — Other Ambulatory Visit: Payer: Self-pay

## 2021-02-08 NOTE — Progress Notes (Signed)
PATIENT NAME: Carolyn Herring DOB: [birth year] MRN: [id number]  PRIMARY CARE PROVIDER: Patient, No Pcp Per (Inactive)  RESPONSIBLE PARTY:  Acct ID - Guarantor Home Phone Work Phone Relationship Acct Type  [id number] [name], [name] [phone]  Self P/F     [address], [place], [place] [address]   COMMUNITY PALLIATIVE CARE RN NOTE    PLAN OF CARE and INTERVENTION:  ADVANCE CARE PLANNING/GOALS OF CARE: Comfort, Safety, Pain management. PATIENT/CAREGIVER EDUCATION: Education regarding Palliative care services DISEASE STATUS:  RN Palliative care visit completed today at [facility]. Found a patient sitting on her bed, just coming in from outside. Patient recently hospitalized from 10/10- 10/24 with diagnoses of hemorrhagic shock, fall, altered mental status, GI bleed related to NSAID use. Patient discharge to [facility] for rehab. Patient is anxious to return home. She reports some tenderness to her rib area due to having chest compressions/CPR during recent hospitalization.    HISTORY OF PRESENT ILLNESS: This is a 45 year old female with PMH including but not limited to Spina Bifida s/p VP shunt, and hydrocephalus. Palliative care has been asked to follow for additional support, goals of care and complex decision making.  CODE STATUS: Full Code PPS: 40%    PHYSICAL EXAM:   Lungs: CTA, No audible congestion, occasional cough  Cardiovascular: no evidence of chest pain, no edema   Skin: Warm, Dry, intact Neurological: Weakness        Pensions consultant, BSN  PHYSICAL EXAM:   VITALS: Today's Vitals   02/04/21 1400  BP: 110/60  Pulse: 88  Resp: 18  SpO2: 96%  PainSc: 0-No pain

## 2022-07-08 IMAGING — MR MR HEAD W/O CM
11 series · 48 of 48 positions shown · non-contrast
Comparison: MRI of the brain January 06, 2021.

CLINICAL DATA: Anoxic brain damage.

EXAM:
MRI HEAD WITHOUT CONTRAST
TECHNIQUE: Multiplanar, multiecho pulse sequences of the brain and surrounding
structures were obtained without intravenous contrast.

[Series 5: ax dwi_tracew · axial · 3.0mm · 1.80mm/px · z∈[-69,+86]mm · 5 of 48 slices shown]
[im 1/48]
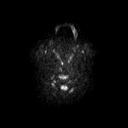
[im 12/48]
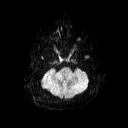
[im 24/48]
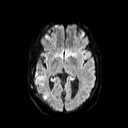
[im 36/48]
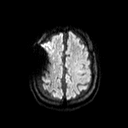
[im 48/48]
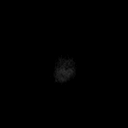

[Series 6: ax dwi_adc · axial · 3.0mm · 1.80mm/px · z∈[-69,+86]mm · 5 of 48 slices shown]
[im 1/48]
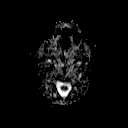
[im 12/48]
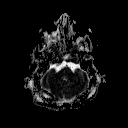
[im 24/48]
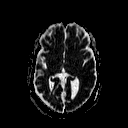
[im 36/48]
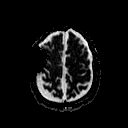
[im 48/48]
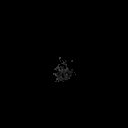

[Series 7: cor dwi_tracew · coronal · 5.0mm · 1.80mm/px · 3 of 38 slices shown]
[im 1/38]
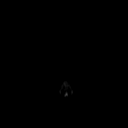
[im 19/38]
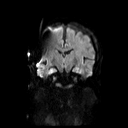
[im 38/38]
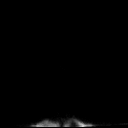

[Series 8: cor dwi_adc · coronal · 5.0mm · 1.80mm/px · 3 of 38 slices shown]
[im 1/38]
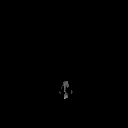
[im 19/38]
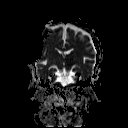
[im 38/38]
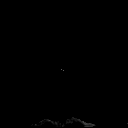

[Series 9: T1 · sagittal · 5.0mm · 0.75mm/px · 2 of 19 slices shown (1 of 2)]
[im 1/19]
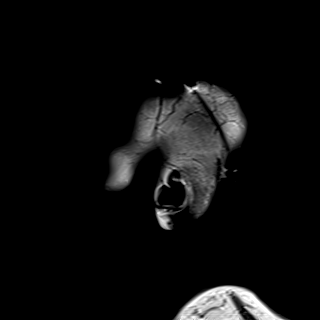
[im 19/19]
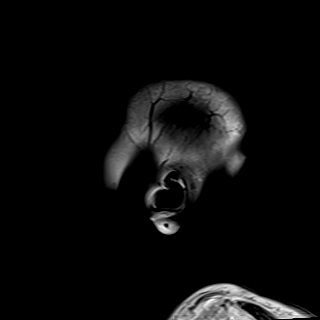

[Series 10: T2 · axial · 5.0mm · 0.86mm/px · z∈[-69,+87]mm · 2 of 27 slices shown (1 of 2)]
[im 1/27]
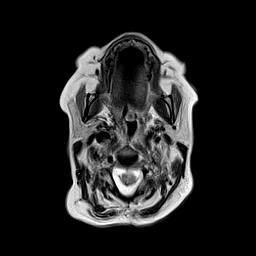
[im 27/27]
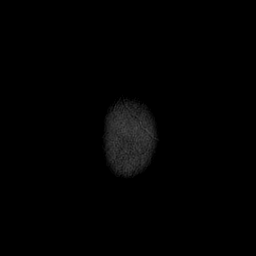

[Series 12: pha_images · axial · 3.0mm · 0.90mm/px · z∈[-74,+91]mm · 5 of 56 slices shown]
[im 1/56]
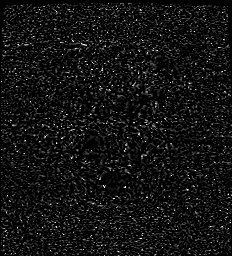
[im 14/56]
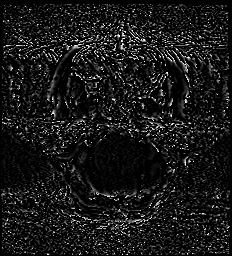
[im 28/56]
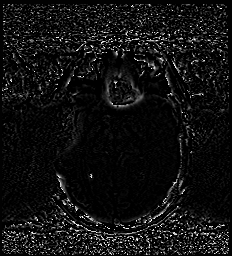
[im 42/56]
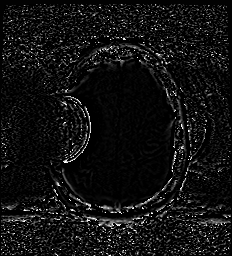
[im 56/56]
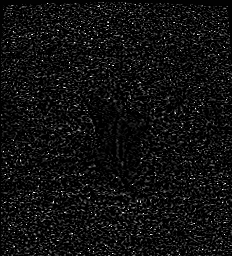

[Series 15: FLAIR · axial · 5.0mm · 0.90mm/px · z∈[-69,+86]mm · 2 of 27 slices shown]
[im 1/27]
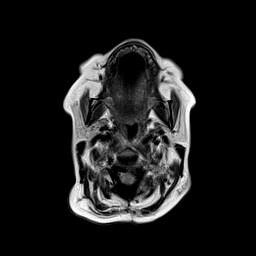
[im 27/27]
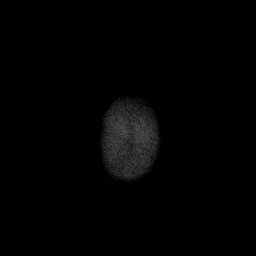

[Series 16: T1 · axial · 1.0mm · 0.98mm/px · z∈[-79,+96]mm · 16 of 176 slices shown (2 of 2)]
[im 1/176]
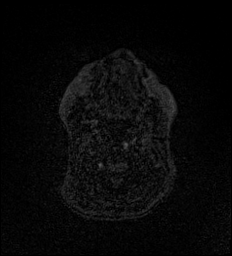
[im 12/176]
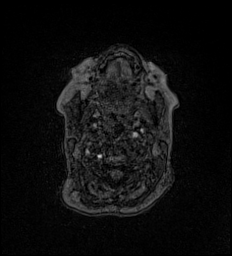
[im 24/176]
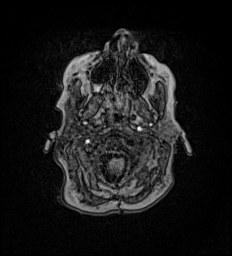
[im 36/176]
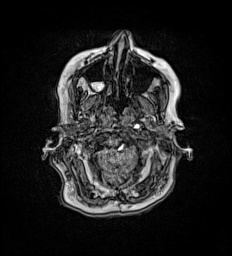
[im 47/176]
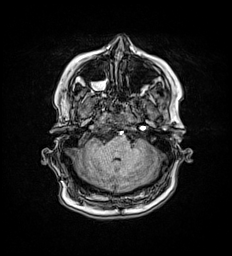
[im 59/176]
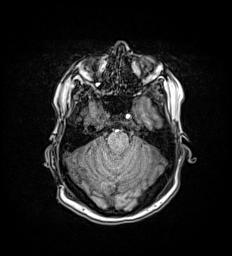
[im 71/176]
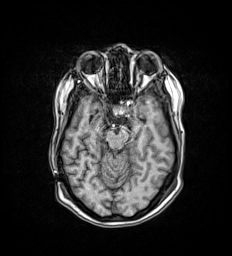
[im 82/176]
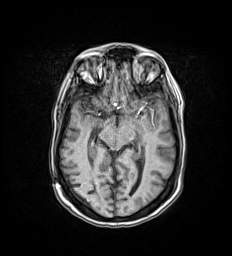
[im 94/176]
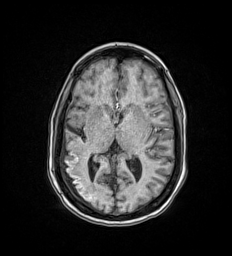
[im 106/176]
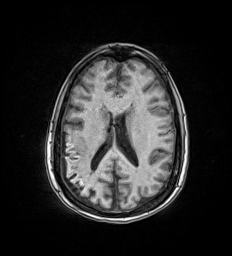
[im 117/176]
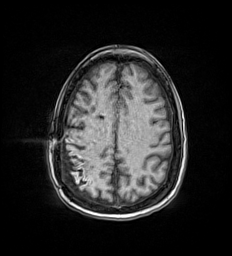
[im 129/176]
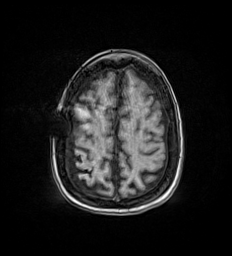
[im 141/176]
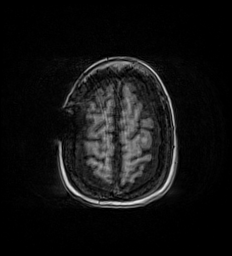
[im 152/176]
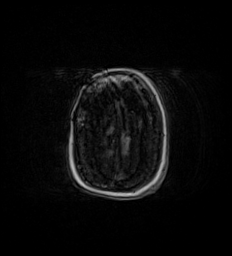
[im 164/176]
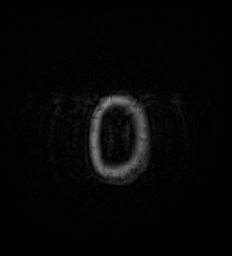
[im 176/176]
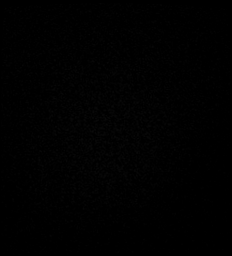

[Series 17: T2 · coronal · 5.0mm · 0.86mm/px · 3 of 29 slices shown (2 of 2)]
[im 1/29]
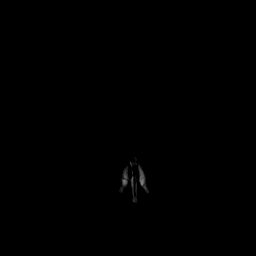
[im 15/29]
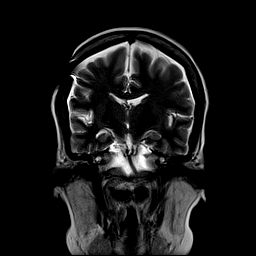
[im 29/29]
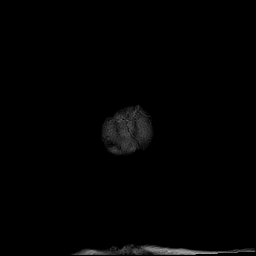

[Series 18: T2-star · axial · 5.0mm · 0.45mm/px · z∈[-69,+87]mm · 2 of 27 slices shown]
[im 1/27]
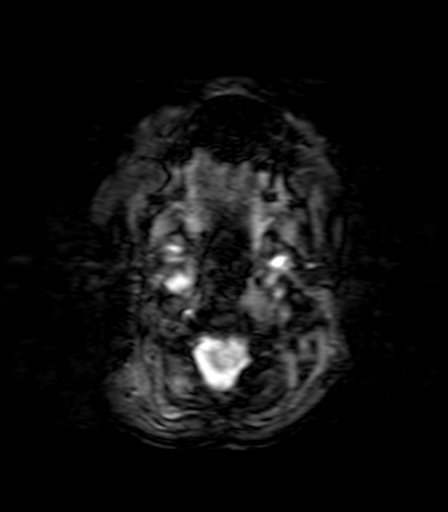
[im 27/27]
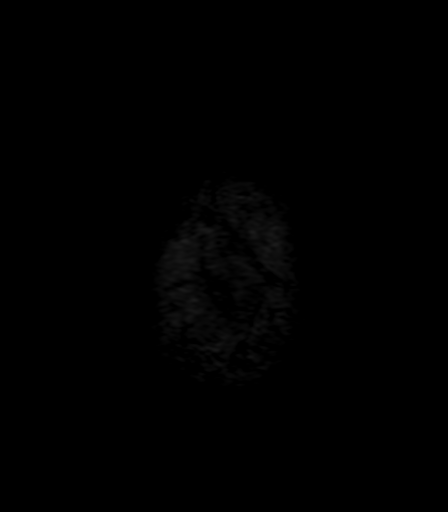

[48 of 48 positions shown; findings below may reference images not displayed]

FINDINGS: Brain: New cortical foci of restricted diffusion in the right
frontoparietal convexity, consistent with recent infarcts.
Persistent mild hyperintensity within the right temporoparietal
region without corresponding low signal on ADC suggesting subacute
infarct. Cortical laminar necrosis is again seen in the area.

Interval decrease in size of the right cerebral convexity subdural
fluid collection 6 mm (8 mm on prior) with minimal mass effect on
the adjacent brain parenchyma. Right frontal approach ventricular
drain with the tip at the body of the right lateral ventricle. There
is no hydrocephalus. Again seen better in of a right parietal gyrus
at the level of a burr hole. Low lying cerebellar tonsils extending
to the C2 level.

Vascular: Absent flow void of the left vertebral artery and proximal
intracranial right ICA, corresponding to occlusion best described or
CT angiogram performed on January 06, 2021.

Skull and upper cervical spine: Postsurgical changes from prior
suboccipital craniotomy and cervical laminectomy

Sinuses/Orbits: Fluid level within the right maxillary sinus and
bilateral sphenoid sinuses. The orbits are maintained.

Other: Bilateral mastoid effusion.
IMPRESSION: 1. Multiple small acute infarcts in the right frontoparietal
convexity.
2. Interval decrease in size of the right subdural hygroma now
measuring 6 mm (8 mm on prior). No midline shift.
3. Inflammatory paranasal sinus and mastoid disease, new since prior
MRI.
